# Patient Record
Sex: Female | Born: 1960 | Race: White | Hispanic: No | Smoking: Current every day smoker
Health system: Southern US, Community
[De-identification: ages and names within clinical notes are randomized; demographics above are authoritative.]

## PROBLEM LIST (undated history)

## (undated) DIAGNOSIS — G894 Chronic pain syndrome: Secondary | ICD-10-CM

## (undated) DIAGNOSIS — M542 Cervicalgia: Secondary | ICD-10-CM

## (undated) DIAGNOSIS — F329 Major depressive disorder, single episode, unspecified: Secondary | ICD-10-CM

## (undated) DIAGNOSIS — K219 Gastro-esophageal reflux disease without esophagitis: Secondary | ICD-10-CM

## (undated) DIAGNOSIS — G43909 Migraine, unspecified, not intractable, without status migrainosus: Secondary | ICD-10-CM

## (undated) DIAGNOSIS — M5412 Radiculopathy, cervical region: Secondary | ICD-10-CM

## (undated) DIAGNOSIS — G4700 Insomnia, unspecified: Secondary | ICD-10-CM

## (undated) DIAGNOSIS — M797 Fibromyalgia: Secondary | ICD-10-CM

## (undated) DIAGNOSIS — Z9889 Other specified postprocedural states: Secondary | ICD-10-CM

## (undated) DIAGNOSIS — M4712 Other spondylosis with myelopathy, cervical region: Secondary | ICD-10-CM

## (undated) DIAGNOSIS — M25552 Pain in left hip: Secondary | ICD-10-CM

## (undated) DIAGNOSIS — F410 Panic disorder [episodic paroxysmal anxiety] without agoraphobia: Secondary | ICD-10-CM

## (undated) DIAGNOSIS — K5909 Other constipation: Secondary | ICD-10-CM

## (undated) DIAGNOSIS — M25551 Pain in right hip: Secondary | ICD-10-CM

## (undated) DIAGNOSIS — F32A Depression, unspecified: Secondary | ICD-10-CM

## (undated) HISTORY — DX: Insomnia, unspecified: G47.00

## (undated) HISTORY — DX: Depression, unspecified: F32.A

## (undated) HISTORY — DX: Cervicalgia: M54.2

## (undated) HISTORY — PX: TUBAL LIGATION: SHX77

## (undated) HISTORY — DX: Other specified postprocedural states: Z98.890

## (undated) HISTORY — DX: Pain in right hip: M25.551

## (undated) HISTORY — DX: Fibromyalgia: M79.7

## (undated) HISTORY — PX: NECK SURGERY: SHX720

## (undated) HISTORY — DX: Other spondylosis with myelopathy, cervical region: M47.12

## (undated) HISTORY — DX: Gastro-esophageal reflux disease without esophagitis: K21.9

## (undated) HISTORY — DX: Chronic pain syndrome: G89.4

## (undated) HISTORY — DX: Radiculopathy, cervical region: M54.12

## (undated) HISTORY — DX: Other constipation: K59.09

## (undated) HISTORY — DX: Migraine, unspecified, not intractable, without status migrainosus: G43.909

## (undated) HISTORY — DX: Panic disorder (episodic paroxysmal anxiety): F41.0

## (undated) HISTORY — DX: Major depressive disorder, single episode, unspecified: F32.9

## (undated) HISTORY — PX: OTHER SURGICAL HISTORY: SHX169

## (undated) HISTORY — DX: Pain in left hip: M25.552

---

## 2008-07-28 ENCOUNTER — Encounter: Admission: RE | Admit: 2008-07-28 | Discharge: 2008-07-28 | Payer: Self-pay | Admitting: Obstetrics and Gynecology

## 2010-08-31 ENCOUNTER — Encounter: Payer: Self-pay | Admitting: *Deleted

## 2011-01-22 ENCOUNTER — Encounter (HOSPITAL_COMMUNITY): Payer: Self-pay | Admitting: Obstetrics and Gynecology

## 2014-05-17 ENCOUNTER — Ambulatory Visit: Payer: Self-pay | Admitting: Pain Medicine

## 2014-09-29 ENCOUNTER — Ambulatory Visit: Payer: Self-pay | Admitting: Pain Medicine

## 2014-09-29 LAB — BASIC METABOLIC PANEL
ANION GAP: 6 — AB (ref 7–16)
BUN: 14 mg/dL (ref 7–18)
CHLORIDE: 104 mmol/L (ref 98–107)
CREATININE: 0.95 mg/dL (ref 0.60–1.30)
Calcium, Total: 9.5 mg/dL (ref 8.5–10.1)
Co2: 27 mmol/L (ref 21–32)
EGFR (African American): >60
GLUCOSE: 88 mg/dL (ref 65–99)
Osmolality: 274 (ref 275–301)
POTASSIUM: 4.3 mmol/L (ref 3.5–5.1)
SODIUM: 137 mmol/L (ref 136–145)

## 2014-09-29 LAB — HEPATIC FUNCTION PANEL A (ARMC)
ALBUMIN: 4.6 g/dL (ref 3.4–5.0)
ALK PHOS: 67 U/L
ALT: 21 U/L
BILIRUBIN DIRECT: 0.2 mg/dL (ref 0.00–0.20)
Bilirubin,Total: 0.7 mg/dL (ref 0.2–1.0)
SGOT(AST): 13 U/L — ABNORMAL LOW (ref 15–37)
TOTAL PROTEIN: 8.1 g/dL (ref 6.4–8.2)

## 2014-09-29 LAB — SEDIMENTATION RATE: Erythrocyte Sed Rate: 4 mm/hr (ref 0–30)

## 2014-09-29 LAB — MAGNESIUM: MAGNESIUM: 1.9 mg/dL

## 2014-10-11 ENCOUNTER — Ambulatory Visit: Payer: Self-pay | Admitting: Pain Medicine

## 2014-11-15 ENCOUNTER — Ambulatory Visit: Payer: Self-pay | Admitting: Pain Medicine

## 2015-09-20 ENCOUNTER — Encounter: Payer: Self-pay | Admitting: *Deleted

## 2015-10-10 ENCOUNTER — Encounter: Payer: Self-pay | Admitting: Pain Medicine

## 2015-10-10 ENCOUNTER — Telehealth: Payer: Self-pay | Admitting: Pain Medicine

## 2015-10-10 NOTE — Telephone Encounter (Signed)
Patient left vmail saying she has stomach virus and cannot come today  vmail Sun 10-09-15 at 1:06

## 2015-10-11 ENCOUNTER — Encounter: Payer: Self-pay | Admitting: Pain Medicine

## 2015-10-11 NOTE — Telephone Encounter (Signed)
Please reschedule any patients calling us to inform us that they are sick. Please remember that all of our procedures are elective. Preferably, schedule the patient to return in 14-20 days.

## 2015-10-18 ENCOUNTER — Encounter: Payer: Self-pay | Admitting: Pain Medicine

## 2015-10-18 ENCOUNTER — Ambulatory Visit: Payer: Worker's Compensation | Attending: Pain Medicine | Admitting: Pain Medicine

## 2015-10-18 VITALS — BP 129/85 | HR 77 | Temp 98.1°F | Resp 18 | Ht 67.0 in | Wt 135.0 lb

## 2015-10-18 DIAGNOSIS — F1190 Opioid use, unspecified, uncomplicated: Secondary | ICD-10-CM | POA: Insufficient documentation

## 2015-10-18 DIAGNOSIS — Z5181 Encounter for therapeutic drug level monitoring: Secondary | ICD-10-CM

## 2015-10-18 DIAGNOSIS — M79643 Pain in unspecified hand: Secondary | ICD-10-CM | POA: Insufficient documentation

## 2015-10-18 DIAGNOSIS — G894 Chronic pain syndrome: Secondary | ICD-10-CM | POA: Insufficient documentation

## 2015-10-18 DIAGNOSIS — M47892 Other spondylosis, cervical region: Secondary | ICD-10-CM | POA: Insufficient documentation

## 2015-10-18 DIAGNOSIS — M542 Cervicalgia: Secondary | ICD-10-CM | POA: Insufficient documentation

## 2015-10-18 DIAGNOSIS — K219 Gastro-esophageal reflux disease without esophagitis: Secondary | ICD-10-CM | POA: Insufficient documentation

## 2015-10-18 DIAGNOSIS — F17200 Nicotine dependence, unspecified, uncomplicated: Secondary | ICD-10-CM | POA: Insufficient documentation

## 2015-10-18 DIAGNOSIS — M47812 Spondylosis without myelopathy or radiculopathy, cervical region: Secondary | ICD-10-CM | POA: Insufficient documentation

## 2015-10-18 DIAGNOSIS — M25559 Pain in unspecified hip: Secondary | ICD-10-CM | POA: Insufficient documentation

## 2015-10-18 DIAGNOSIS — Z79891 Long term (current) use of opiate analgesic: Secondary | ICD-10-CM

## 2015-10-18 DIAGNOSIS — M25561 Pain in right knee: Secondary | ICD-10-CM

## 2015-10-18 DIAGNOSIS — M5412 Radiculopathy, cervical region: Secondary | ICD-10-CM | POA: Insufficient documentation

## 2015-10-18 DIAGNOSIS — M797 Fibromyalgia: Secondary | ICD-10-CM | POA: Insufficient documentation

## 2015-10-18 DIAGNOSIS — Z981 Arthrodesis status: Secondary | ICD-10-CM | POA: Diagnosis not present

## 2015-10-18 DIAGNOSIS — G8929 Other chronic pain: Secondary | ICD-10-CM | POA: Insufficient documentation

## 2015-10-18 DIAGNOSIS — M25562 Pain in left knee: Secondary | ICD-10-CM

## 2015-10-18 DIAGNOSIS — F411 Generalized anxiety disorder: Secondary | ICD-10-CM | POA: Insufficient documentation

## 2015-10-18 DIAGNOSIS — M546 Pain in thoracic spine: Secondary | ICD-10-CM | POA: Insufficient documentation

## 2015-10-18 DIAGNOSIS — F1320 Sedative, hypnotic or anxiolytic dependence, uncomplicated: Secondary | ICD-10-CM | POA: Diagnosis not present

## 2015-10-18 DIAGNOSIS — F1120 Opioid dependence, uncomplicated: Secondary | ICD-10-CM

## 2015-10-18 DIAGNOSIS — Z8659 Personal history of other mental and behavioral disorders: Secondary | ICD-10-CM

## 2015-10-18 DIAGNOSIS — M549 Dorsalgia, unspecified: Secondary | ICD-10-CM

## 2015-10-18 DIAGNOSIS — Z9889 Other specified postprocedural states: Secondary | ICD-10-CM

## 2015-10-18 DIAGNOSIS — F410 Panic disorder [episodic paroxysmal anxiety] without agoraphobia: Secondary | ICD-10-CM | POA: Diagnosis not present

## 2015-10-18 DIAGNOSIS — G43909 Migraine, unspecified, not intractable, without status migrainosus: Secondary | ICD-10-CM | POA: Diagnosis not present

## 2015-10-18 DIAGNOSIS — M25551 Pain in right hip: Secondary | ICD-10-CM

## 2015-10-18 DIAGNOSIS — M545 Low back pain: Secondary | ICD-10-CM

## 2015-10-18 DIAGNOSIS — M2550 Pain in unspecified joint: Secondary | ICD-10-CM | POA: Insufficient documentation

## 2015-10-18 DIAGNOSIS — M25552 Pain in left hip: Secondary | ICD-10-CM

## 2015-10-18 HISTORY — DX: Other specified postprocedural states: Z98.890

## 2015-10-18 HISTORY — DX: Pain in right hip: M25.551

## 2015-10-18 HISTORY — DX: Radiculopathy, cervical region: M54.12

## 2015-10-18 HISTORY — DX: Cervicalgia: M54.2

## 2015-10-18 HISTORY — DX: Pain in left hip: M25.552

## 2015-10-18 MED ORDER — HYDROCODONE-ACETAMINOPHEN 7.5-325 MG PO TABS: 1.0000 | ORAL_TABLET | Freq: Four times a day (QID) | ORAL | Status: DC | PRN

## 2015-10-18 NOTE — Progress Notes (Signed)
Patient's Name: Teresa [name] MRN: [id number] DOB: 09/11/1961 DOS: 10/18/2015  Primary Reason(s) for Visit: Encounter for Medication Management. CC: Neck Pain and Hip Pain   HPI:   Teresa [name] is a 54 y.o. year old, female patient, who returns today as an established patient. She has History of panic attacks; Encounter for therapeutic drug level monitoring; Long term current use of opiate analgesic; Chronic cervical radiculopathy; Cervical spine pain; Benzodiazepine dependence (HCC); Uncomplicated opioid dependence (HCC); Opiate use; Chronic pain syndrome; Chronic upper back pain; Thoracic back pain; Hand pain; Upper extremity pain; Pain in joint, shoulder region; Bilateral hip pain; H/O cervical spine surgery; Bilateral chronic knee pain; Chronic low back pain;  cervical spondylosis; Generalized anxiety disorder; Cervical facet syndrome; Myofascial pain syndrome; Diffuse arthralgia; Tobacco use disorder; Fibromyalgia;  History of anterior cervical discectomy and fusion (ACDF) (left side).; and Cervical radicular pain, left side on her problem list.. Her primarily concern today is the Neck Pain and Hip Pain    The patient comes into the clinic today clearly oversedated. In reviewing  Her medications, it turns out that she has been taking hydrocodone 10 mg 4 times a day in addition to alprazolam 2 mg twice a day. Because this is not compatible with the CDC opioid guidelines I have given the patient a choice between continuing to take the hydrocodone or alprazolam. She has asked me for several alternatives and we went over all of those. I explained to the patient that I'll have experience in psychiatry and therefore I cannot recommend an alternative treatment for her panic attacks. We came to the conclusion that she needed to be seen by a psychiatrist and therefore I have made arrangements to get a consult. I do not prescribe benzodiazepines, the patient has been getting them from Teresa [name]. When the  patient was confronted between stopping the pain medicine and the benzodiazepine she indicated that with some help she could probably stopped the benzodiazepines. If we could achieve this, this would be the best alternative. In addition I went over the patient's symptoms and it would appear that her neck pain and bilateral upper extremity pain sit to be the worst. I have offered her some alternatives which includes cervical epidural steroid injections, facet blocks, and depending on the results of those we may consider radiofrequency. I asked her if she had a recent MRI and I couldn't find a reason copy but we did find copies of some x-rays that we did of her cervical spine demonstrating disc prostheses at C4-5, C5-6, and C6-7 levels. The patient has x-ray evidence of a prior C4-C7 anterior fusion. In reviewing the x-rays, they did mention that the patient had a cervical MRI done on 08/31/2010. Unfortunately we do not have the results of that one. In view of this, today we have ordered a repeat MRI since the patient indicates that her symptoms have been worsening. She indicates intermittent weakness of the upper extremities for she drops things. The patient also indicated having had some nerve conduction test done , which again are unavailable. The patient indicated that [facility].  Pharmacotherapy Review: Side-effects or Adverse reactions: None reported. Effectiveness: Described as relatively effective, allowing for increase in activities of daily living (ADL). Onset of action: Within expected pharmacological parameters. Duration of action: Within normal limits for medication. Peak effect: Timing and results are as within normal expected parameters. [place] PMP: Compliant with practice rules and regulations. DST: Compliant with practice rules and regulations.  Lab work: No new labs ordered by our practice. Treatment compliance: Compliant. Substance Use Disorder (SUD) Risk Level:  Low Planned course of action: Continue therapy as is.  Allergies: Teresa [name] is allergic to codeine.  Meds: The patient has a current medication list which includes the following prescription(s): alprazolam, diclofenac, docusate sodium, fluoxetine, hydrocodone-acetaminophen, magnesium oxide, omeprazole, and tizanidine. Requested Prescriptions   Signed Prescriptions Disp Refills  . HYDROcodone-acetaminophen (NORCO) 7.5-325 MG tablet 120 tablet 0    Sig: Take 1 tablet by mouth every 6 (six) hours as needed for moderate pain.    ROS: Constitutional: Afebrile, no chills, well hydrated and well nourished Gastrointestinal: negative Musculoskeletal:negative Neurological: negative Behavioral/Psych: negative  PFSH: Medical:  Teresa [name]  has a past medical history of Fibromyalgia; Cervical spondylosis with myelopathy; Neck pain; Chronic pain syndrome; Depression; Panic attack; Insomnia; GERD (gastroesophageal reflux disease); Chronic constipation; Migraine; and  History of anterior cervical discectomy and fusion (ACDF) (left side). (10/18/2015). Family: family history is not on file. Surgical:  has past surgical history that includes Neck surgery and btl. Tobacco:  reports that she has quit smoking. She does not have any smokeless tobacco history on file. Alcohol:  reports that she does not drink alcohol. Drug:  reports that she does not use illicit drugs.  Physical Exam: Vitals:  Today's Vitals   10/18/15 1332 10/18/15 1334  BP: 129/85   [name]: 77   Temp: 98.1 F (36.7 C)   TempSrc: Oral   Resp: 18   Height:  (1.702 m)   Weight: 135 lb (61.236 kg)   SpO2: 100%   PainSc:  8   Calculated BMI: Body mass index is 21.14 kg/(m^2). General appearance: alert, cooperative, appears stated age, distracted, mild distress and slowed mentation Eyes: conjunctivae/corneas clear. PERRL, EOM's intact. Fundi benign. Lungs: No evidence respiratory distress, no audible rales or ronchi and no use  of accessory muscles of respiration Neck: no adenopathy, no carotid bruit, no JVD, supple, symmetrical, trachea midline and thyroid not enlarged, symmetric, no tenderness/mass/nodules Back: symmetric, no curvature. ROM normal. No CVA tenderness. Extremities: extremities normal, atraumatic, no cyanosis or edema Pulses: 2+ and symmetric Skin: Skin color, texture, turgor normal. No rashes or lesions Neurologic: Grossly normal    Assessment: Encounter Diagnosis:  Primary Diagnosis: Chronic pain syndrome [G89.4]  Plan: Lova was seen today for neck pain and hip pain.  Diagnoses and all orders for this visit:  Chronic pain syndrome  Opiate use  Uncomplicated opioid dependence (HCC)  Benzodiazepine dependence (HCC) -     Ambulatory referral to Psychiatry  Cervical spine pain  Chronic cervical radiculopathy Comments:  bilateral Orders: -     MR Cervical Spine W Wo Contrast; Future  Long term current use of opiate analgesic -     Drugs of abuse screen w/o alc, rtn urine-sln; Standing  Encounter for therapeutic drug level monitoring  History of panic attacks -     Ambulatory referral to Psychiatry   History of anterior cervical discectomy and fusion (ACDF) (left side).  Cervical radicular pain, left side  Other orders -     HYDROcodone-acetaminophen (NORCO) 7.5-325 MG tablet; Take 1 tablet by mouth every 6 (six) hours as needed for moderate pain.     There are no Patient Instructions on file for this visit. Medications discontinued today:  Medications Discontinued During This Encounter  Medication Reason  . HYDROcodone-acetaminophen (NORCO) 7.5-325 MG tablet Reorder   Medications administered today:  Ms. Villa had no medications administered during this visit.  Primary Care Physician: No primary care provider on file. Location: [facility] Note by: Galia Rahm A. Laban Emperor, M.D, DABA, DABAPM, DABPM, DABIPP, FIPP

## 2015-10-18 NOTE — Progress Notes (Signed)
Safety precautions to be maintained throughout the outpatient stay will include: orient to surroundings, keep bed in low position, maintain call bell within reach at all times, provide assistance with transfer out of bed and ambulation.   Did not bring medicines today- asked to bring next appt

## 2015-11-15 ENCOUNTER — Telehealth: Payer: Self-pay

## 2015-11-15 NOTE — Telephone Encounter (Signed)
Pt wants someone to call her about her records and orders of x-rays... Pt would not go into detail.

## 2015-11-16 ENCOUNTER — Encounter: Payer: Self-pay | Admitting: Pain Medicine

## 2015-11-16 NOTE — Telephone Encounter (Signed)
Spoke with patient and told her that we do have her records, she will keep her appt in December.

## 2015-11-17 ENCOUNTER — Other Ambulatory Visit: Payer: Self-pay | Admitting: Pain Medicine

## 2015-12-01 ENCOUNTER — Ambulatory Visit: Payer: Worker's Compensation | Attending: Pain Medicine | Admitting: Pain Medicine

## 2015-12-01 ENCOUNTER — Other Ambulatory Visit: Payer: Self-pay | Admitting: Pain Medicine

## 2015-12-01 ENCOUNTER — Encounter: Payer: Self-pay | Admitting: Pain Medicine

## 2015-12-01 VITALS — BP 127/75 | HR 84 | Temp 98.4°F | Resp 16 | Ht 67.0 in | Wt 130.0 lb

## 2015-12-01 DIAGNOSIS — M5412 Radiculopathy, cervical region: Secondary | ICD-10-CM

## 2015-12-01 DIAGNOSIS — M792 Neuralgia and neuritis, unspecified: Secondary | ICD-10-CM

## 2015-12-01 DIAGNOSIS — Z5181 Encounter for therapeutic drug level monitoring: Secondary | ICD-10-CM | POA: Diagnosis not present

## 2015-12-01 DIAGNOSIS — F17200 Nicotine dependence, unspecified, uncomplicated: Secondary | ICD-10-CM | POA: Insufficient documentation

## 2015-12-01 DIAGNOSIS — M791 Myalgia: Secondary | ICD-10-CM

## 2015-12-01 DIAGNOSIS — Z9889 Other specified postprocedural states: Secondary | ICD-10-CM | POA: Diagnosis not present

## 2015-12-01 DIAGNOSIS — M7918 Myalgia, other site: Secondary | ICD-10-CM | POA: Insufficient documentation

## 2015-12-01 DIAGNOSIS — M797 Fibromyalgia: Secondary | ICD-10-CM

## 2015-12-01 DIAGNOSIS — M25562 Pain in left knee: Secondary | ICD-10-CM | POA: Diagnosis not present

## 2015-12-01 DIAGNOSIS — Z79891 Long term (current) use of opiate analgesic: Secondary | ICD-10-CM

## 2015-12-01 DIAGNOSIS — M62838 Other muscle spasm: Secondary | ICD-10-CM

## 2015-12-01 DIAGNOSIS — M961 Postlaminectomy syndrome, not elsewhere classified: Secondary | ICD-10-CM | POA: Diagnosis not present

## 2015-12-01 DIAGNOSIS — M25519 Pain in unspecified shoulder: Secondary | ICD-10-CM | POA: Diagnosis present

## 2015-12-01 DIAGNOSIS — Z7189 Other specified counseling: Secondary | ICD-10-CM

## 2015-12-01 DIAGNOSIS — M542 Cervicalgia: Secondary | ICD-10-CM | POA: Diagnosis present

## 2015-12-01 DIAGNOSIS — Z0000 Encounter for general adult medical examination without abnormal findings: Secondary | ICD-10-CM | POA: Insufficient documentation

## 2015-12-01 DIAGNOSIS — M6248 Contracture of muscle, other site: Secondary | ICD-10-CM

## 2015-12-01 DIAGNOSIS — G8929 Other chronic pain: Secondary | ICD-10-CM

## 2015-12-01 DIAGNOSIS — F1190 Opioid use, unspecified, uncomplicated: Secondary | ICD-10-CM | POA: Diagnosis not present

## 2015-12-01 DIAGNOSIS — Z79899 Other long term (current) drug therapy: Secondary | ICD-10-CM | POA: Diagnosis not present

## 2015-12-01 DIAGNOSIS — Y990 Civilian activity done for income or pay: Secondary | ICD-10-CM

## 2015-12-01 DIAGNOSIS — R52 Pain, unspecified: Secondary | ICD-10-CM

## 2015-12-01 DIAGNOSIS — Z5189 Encounter for other specified aftercare: Secondary | ICD-10-CM | POA: Diagnosis not present

## 2015-12-01 DIAGNOSIS — F1120 Opioid dependence, uncomplicated: Secondary | ICD-10-CM

## 2015-12-01 DIAGNOSIS — F410 Panic disorder [episodic paroxysmal anxiety] without agoraphobia: Secondary | ICD-10-CM | POA: Diagnosis not present

## 2015-12-01 DIAGNOSIS — M25561 Pain in right knee: Secondary | ICD-10-CM | POA: Insufficient documentation

## 2015-12-01 MED ORDER — GABAPENTIN 100 MG PO CAPS: 300.0000 mg | ORAL_CAPSULE | Freq: Four times a day (QID) | ORAL | Status: DC

## 2015-12-01 MED ORDER — OXYCODONE HCL 10 MG PO TABS: 10.0000 mg | ORAL_TABLET | Freq: Four times a day (QID) | ORAL | Status: DC | PRN

## 2015-12-01 MED ORDER — BACLOFEN 10 MG PO TABS
10.0000 mg | ORAL_TABLET | Freq: Three times a day (TID) | ORAL | Status: DC
Start: 2015-12-01 — End: 2016-02-01

## 2015-12-01 MED ORDER — MAGNESIUM OXIDE 400 MG PO TABS: 400.0000 mg | ORAL_TABLET | Freq: Every day | ORAL | Status: DC

## 2015-12-01 MED ORDER — MELOXICAM 15 MG PO TABS: 15.0000 mg | ORAL_TABLET | Freq: Every day | ORAL | Status: DC

## 2015-12-01 NOTE — Patient Instructions (Addendum)
Pain Management Discharge Instructions  General Discharge Instructions :  If you need to reach your doctor call: Monday-Friday 8:00 am - 4:00 pm at [phone] or toll free [phone].  After clinic hours [phone] to have operator reach doctor.  Bring all of your medication bottles to all your appointments in the pain clinic.  To cancel or reschedule your appointment with Pain Management please remember to call 24 hours in advance to avoid a fee.  Refer to the educational materials which you have been given on: General Risks, I had my Procedure. Discharge Instructions, Post Sedation.  Post Procedure Instructions:  The drugs you were given will stay in your system until tomorrow, so for the next 24 hours you should not drive, make any legal decisions or drink any alcoholic beverages.  You may eat anything you prefer, but it is better to start with liquids then soups and crackers, and gradually work up to solid foods.  Please notify your doctor immediately if you have any unusual bleeding, trouble breathing or pain that is not related to your normal pain.  Depending on the type of procedure that was done, some parts of your body may feel week and/or numb.  This usually clears up by tonight or the next day.  Walk with the use of an assistive device or accompanied by an adult for the 24 hours.  You may use ice on the affected area for the first 24 hours.  Put ice in a Ziploc bag and cover with a towel and place against area 15 minutes on 15 minutes off.  You may switch to heat after 24 hours.Gabapentin capsules or tablets What is this medicine? GABAPENTIN (GA ba pen tin) is used to control partial seizures in adults with epilepsy. It is also used to treat certain types of nerve pain. This medicine may be used for other purposes; ask your health care provider or pharmacist if you have questions. What should I tell my health care provider before I take this medicine? They need to know  if you have any of these conditions: -kidney disease -suicidal thoughts, plans, or attempt; a previous suicide attempt by you or a family member -an unusual or allergic reaction to gabapentin, other medicines, foods, dyes, or preservatives -pregnant or trying to get pregnant -breast-feeding How should I use this medicine? Take this medicine by mouth with a glass of water. Follow the directions on the prescription label. You can take it with or without food. If it upsets your stomach, take it with food.Take your medicine at regular intervals. Do not take it more often than directed. Do not stop taking except on your doctor's advice. If you are directed to break the 600 or 800 mg tablets in half as part of your dose, the extra half tablet should be used for the next dose. If you have not used the extra half tablet within 28 days, it should be thrown away. A special MedGuide will be given to you by the pharmacist with each prescription and refill. Be sure to read this information carefully each time. Talk to your pediatrician regarding the use of this medicine in children. Special care may be needed. Overdosage: If you think you have taken too much of this medicine contact a poison control center or emergency room at once. NOTE: This medicine is only for you. Do not share this medicine with others. What if I miss a dose? If you miss a dose, take it as soon as you can. If it  is almost time for your next dose, take only that dose. Do not take double or extra doses. What may interact with this medicine? Do not take this medicine with any of the following medications: -other gabapentin products This medicine may also interact with the following medications: -alcohol -antacids -antihistamines for allergy, cough and cold -certain medicines for anxiety or sleep -certain medicines for depression or psychotic disturbances -homatropine; hydrocodone -naproxen -narcotic medicines (opiates) for  pain -phenothiazines like chlorpromazine, mesoridazine, prochlorperazine, thioridazine This list may not describe all possible interactions. Give your health care provider a list of all the medicines, herbs, non-prescription drugs, or dietary supplements you use. Also tell them if you smoke, drink alcohol, or use illegal drugs. Some items may interact with your medicine. What should I watch for while using this medicine? Visit your doctor or health care professional for regular checks on your progress. You may want to keep a record at home of how you feel your condition is responding to treatment. You may want to share this information with your doctor or health care professional at each visit. You should contact your doctor or health care professional if your seizures get worse or if you have any new types of seizures. Do not stop taking this medicine or any of your seizure medicines unless instructed by your doctor or health care professional. Stopping your medicine suddenly can increase your seizures or their severity. Wear a medical identification bracelet or chain if you are taking this medicine for seizures, and carry a card that lists all your medications. You may get drowsy, dizzy, or have blurred vision. Do not drive, use machinery, or do anything that needs mental alertness until you know how this medicine affects you. To reduce dizzy or fainting spells, do not sit or stand up quickly, especially if you are an older patient. Alcohol can increase drowsiness and dizziness. Avoid alcoholic drinks. Your mouth may get dry. Chewing sugarless gum or sucking hard candy, and drinking plenty of water will help. The use of this medicine may increase the chance of suicidal thoughts or actions. Pay special attention to how you are responding while on this medicine. Any worsening of mood, or thoughts of suicide or dying should be reported to your health care professional right away. Women who become pregnant  while using this medicine may enroll in the [place] Antiepileptic Drug Pregnancy Registry by calling [phone]. This registry collects information about the safety of antiepileptic drug use during pregnancy. What side effects may I notice from receiving this medicine? Side effects that you should report to your doctor or health care professional as soon as possible: -allergic reactions like skin rash, itching or hives, swelling of the face, lips, or tongue -worsening of mood, thoughts or actions of suicide or dying Side effects that usually do not require medical attention (report to your doctor or health care professional if they continue or are bothersome): -constipation -difficulty walking or controlling muscle movements -dizziness -nausea -slurred speech -tiredness -tremors -weight gain This list may not describe all possible side effects. Call your doctor for medical advice about side effects. You may report side effects to FDA at 1-800-FDA-1088. Where should I keep my medicine? Keep out of reach of children. This medicine may cause accidental overdose and death if it taken by other adults, children, or pets. Mix any unused medicine with a substance like cat litter or coffee grounds. Then throw the medicine away in a sealed container like a sealed bag or a coffee can  with a lid. Do not use the medicine after the expiration date. Store at room temperature between 15 and 30 degrees C (59 and 86 degrees F). NOTE: This sheet is a summary. It may not cover all possible information. If you have questions about this medicine, talk to your doctor, pharmacist, or health care provider.    2016, Elsevier/Gold Standard. (2014-02-12 15:26:50) Epidural Steroid Injection An epidural steroid injection is given to relieve pain in your neck, back, or legs that is caused by the irritation or swelling of a nerve root. This procedure involves injecting a steroid and numbing medicine (anesthetic)  into the epidural space. The epidural space is the space between the outer covering of your spinal cord and the bones that form your backbone (vertebra).  LET [facility] CARE PROVIDER KNOW ABOUT:   Any allergies you have.  All medicines you are taking, including vitamins, herbs, eye drops, creams, and over-the-counter medicines such as aspirin.  Previous problems you or members of your family have had with the use of anesthetics.  Any blood disorders or blood clotting disorders you have.  Previous surgeries you have had.  Medical conditions you have. RISKS AND COMPLICATIONS Generally, this is a safe procedure. However, as with any procedure, complications can occur. Possible complications of epidural steroid injection include:  Headache.  Bleeding.  Infection.  Allergic reaction to the medicines.  Damage to your nerves. The response to this procedure depends on the underlying cause of the pain and its duration. People who have long-term (chronic) pain are less likely to benefit from epidural steroids than are those people whose pain comes on strong and suddenly. BEFORE THE PROCEDURE   Ask your health care provider about changing or stopping your regular medicines. You may be advised to stop taking blood-thinning medicines a few days before the procedure.  You may be given medicines to reduce anxiety.  Arrange for someone to take you home after the procedure. PROCEDURE   You will remain awake during the procedure. You may receive medicine to make you relaxed.  You will be asked to lie on your stomach.  The injection site will be cleaned.  The injection site will be numbed with a medicine (local anesthetic).  A needle will be injected through your skin into the epidural space.  Your health care provider will use an X-ray machine to ensure that the steroid is delivered closest to the affected nerve. You may have minimal discomfort at this time.  Once the needle is in the  right position, the local anesthetic and the steroid will be injected into the epidural space.  The needle will then be removed and a bandage will be applied to the injection site. AFTER THE PROCEDURE   You may be monitored for a short time before you go home.  You may feel weakness or numbness in your arm or leg, which disappears within hours.  You may be allowed to eat, drink, and take your regular medicine.  You may have soreness at the site of the injection.   This information is not intended to replace advice given to you by your health care provider. Make sure you discuss any questions you have with your health care provider.   Document Released: 03/25/2008 Document Revised: 08/19/2013 Document Reviewed: 06/05/2013 Elsevier Interactive Patient Education 2016 Elsevier Inc. GENERAL RISKS AND COMPLICATIONS  What are the risk, side effects and possible complications? Generally speaking, most procedures are safe.  However, with any procedure there are risks, side effects,  and the possibility of complications.  The risks and complications are dependent upon the sites that are lesioned, or the type of nerve block to be performed.  The closer the procedure is to the spine, the more serious the risks are.  Great care is taken when placing the radio frequency needles, block needles or lesioning probes, but sometimes complications can occur.  Infection: Any time there is an injection through the skin, there is a risk of infection.  This is why sterile conditions are used for these blocks.  There are four possible types of infection.  Localized skin infection.  Central Nervous System Infection-This can be in the form of Meningitis, which can be deadly.  Epidural Infections-This can be in the form of an epidural abscess, which can cause pressure inside of the spine, causing compression of the spinal cord with subsequent paralysis. This would require an emergency surgery to decompress, and there  are no guarantees that the patient would recover from the paralysis.  Discitis-This is an infection of the intervertebral discs.  It occurs in about 1% of discography procedures.  It is difficult to treat and it may lead to surgery.        2. Pain: the needles have to go through skin and soft tissues, will cause soreness.       3. Damage to internal structures:  The nerves to be lesioned may be near blood vessels or    other nerves which can be potentially damaged.       4. Bleeding: Bleeding is more common if the patient is taking blood thinners such as  aspirin, Coumadin, Ticiid, Plavix, etc., or if he/she have some genetic predisposition  such as hemophilia. Bleeding into the spinal canal can cause compression of the spinal  cord with subsequent paralysis.  This would require an emergency surgery to  decompress and there are no guarantees that the patient would recover from the  paralysis.       5. Pneumothorax:  Puncturing of a lung is a possibility, every time a needle is introduced in  the area of the chest or upper back.  Pneumothorax refers to free air around the  collapsed lung(s), inside of the thoracic cavity (chest cavity).  Another two possible  complications related to a similar event would include: Hemothorax and Chylothorax.   These are variations of the Pneumothorax, where instead of air around the collapsed  lung(s), you may have blood or chyle, respectively.       6. Spinal headaches: They may occur with any procedures in the area of the spine.       7. Persistent CSF (Cerebro-Spinal Fluid) leakage: This is a rare problem, but may occur  with prolonged intrathecal or epidural catheters either due to the formation of a fistulous  track or a dural tear.       8. Nerve damage: By working so close to the spinal cord, there is always a possibility of  nerve damage, which could be as serious as a permanent spinal cord injury with  paralysis.       9. Death:  Although rare, severe deadly  allergic reactions known as "Anaphylactic  reaction" can occur to any of the medications used.      10. Worsening of the symptoms:  We can always make thing worse.  What are the chances of something like this happening? Chances of any of this occuring are extremely low.  By statistics, you have more of a chance of  getting killed in a motor vehicle accident: while driving to the hospital than any of the above occurring .  Nevertheless, you should be aware that they are possibilities.  In general, it is similar to taking a shower.  Everybody knows that you can slip, hit your head and get killed.  Does that mean that you should not shower again?  Nevertheless always keep in mind that statistics do not mean anything if you happen to be on the wrong side of them.  Even if a procedure has a 1 (one) in a 1,000,000 (million) chance of going wrong, it you happen to be that one..Also, keep in mind that by statistics, you have more of a chance of having something go wrong when taking medications.  Who should not have this procedure? If you are on a blood thinning medication (e.g. Coumadin, Plavix, see list of "Blood Thinners"), or if you have an active infection going on, you should not have the procedure.  If you are taking any blood thinners, please inform your physician.  How should I prepare for this procedure?  Do not eat or drink anything at least six hours prior to the procedure.  Bring a driver with you .  It cannot be a taxi.  Come accompanied by an adult that can drive you back, and that is strong enough to help you if your legs get weak or numb from the local anesthetic.  Take all of your medicines the morning of the procedure with just enough water to swallow them.  If you have diabetes, make sure that you are scheduled to have your procedure done first thing in the morning, whenever possible.  If you have diabetes, take only half of your insulin dose and notify our nurse that you have done so  as soon as you arrive at the clinic.  If you are diabetic, but only take blood sugar pills (oral hypoglycemic), then do not take them on the morning of your procedure.  You may take them after you have had the procedure.  Do not take aspirin or any aspirin-containing medications, at least eleven (11) days prior to the procedure.  They may prolong bleeding.  Wear loose fitting clothing that may be easy to take off and that you would not mind if it got stained with Betadine or blood.  Do not wear any jewelry or perfume  Remove any nail coloring.  It will interfere with some of our monitoring equipment.  NOTE: Remember that this is not meant to be interpreted as a complete list of all possible complications.  Unforeseen problems may occur.  BLOOD THINNERS The following drugs contain aspirin or other products, which can cause increased bleeding during surgery and should not be taken for 2 weeks prior to and 1 week after surgery.  If you should need take something for relief of minor pain, you may take acetaminophen which is found in Tylenol,m Datril, Anacin-3 and Panadol. It is not blood thinner. The products listed below are.  Do not take any of the products listed below in addition to any listed on your instruction sheet.  A.P.C or A.P.C with Codeine Codeine Phosphate Capsules #3 Ibuprofen Ridaura  ABC compound Congesprin Imuran rimadil  Advil Cope Indocin Robaxisal  Alka-Seltzer Effervescent Pain Reliever and Antacid Coricidin or Coricidin-D  Indomethacin Rufen  Alka-Seltzer plus Cold Medicine Cosprin Ketoprofen S-A-C Tablets  Anacin Analgesic Tablets or Capsules Coumadin Korlgesic Salflex  Anacin Extra Strength Analgesic tablets or capsules CP-2 Tablets Lanoril Salicylate  Anaprox Cuprimine Capsules Levenox Salocol  Anexsia-D Dalteparin Magan Salsalate  Anodynos Darvon compound Magnesium Salicylate Sine-off  Ansaid Dasin Capsules Magsal Sodium Salicylate  Anturane Depen Capsules Marnal  Soma  APF Arthritis pain formula Dewitt's Pills Measurin Stanback  Argesic Dia-Gesic Meclofenamic Sulfinpyrazone  Arthritis Bayer Timed Release Aspirin Diclofenac Meclomen Sulindac  Arthritis pain formula Anacin Dicumarol Medipren Supac  Analgesic (Safety coated) Arthralgen Diffunasal Mefanamic Suprofen  Arthritis Strength Bufferin Dihydrocodeine Mepro Compound Suprol  Arthropan liquid Dopirydamole Methcarbomol with Aspirin Synalgos  ASA tablets/Enseals Disalcid Micrainin Tagament  Ascriptin Doan's Midol Talwin  Ascriptin A/D Dolene Mobidin Tanderil  Ascriptin Extra Strength Dolobid Moblgesic Ticlid  Ascriptin with Codeine Doloprin or Doloprin with Codeine Momentum Tolectin  Asperbuf Duoprin Mono-gesic Trendar  Aspergum Duradyne Motrin or Motrin IB Triminicin  Aspirin plain, buffered or enteric coated Durasal Myochrisine Trigesic  Aspirin Suppositories Easprin Nalfon Trillsate  Aspirin with Codeine Ecotrin Regular or Extra Strength Naprosyn Uracel  Atromid-S Efficin Naproxen Ursinus  Auranofin Capsules Elmiron Neocylate Vanquish  Axotal Emagrin Norgesic Verin  Azathioprine Empirin or Empirin with Codeine Normiflo Vitamin E  Azolid Emprazil Nuprin Voltaren  Bayer Aspirin plain, buffered or children's or timed BC Tablets or powders Encaprin Orgaran Warfarin Sodium  Buff-a-Comp Enoxaparin Orudis Zorpin  Buff-a-Comp with Codeine Equegesic Os-Cal-Gesic   Buffaprin Excedrin plain, buffered or Extra Strength Oxalid   Bufferin Arthritis Strength Feldene Oxphenbutazone   Bufferin plain or Extra Strength Feldene Capsules Oxycodone with Aspirin   Bufferin with Codeine Fenoprofen Fenoprofen Pabalate or Pabalate-SF   Buffets II Flogesic Panagesic   Buffinol plain or Extra Strength Florinal or Florinal with Codeine Panwarfarin   Buf-Tabs Flurbiprofen Penicillamine   Butalbital Compound Four-way cold tablets Penicillin   Butazolidin Fragmin Pepto-Bismol   Carbenicillin Geminisyn Percodan   Carna  Arthritis Reliever Geopen Persantine   Carprofen Gold's salt Persistin   Chloramphenicol Goody's Phenylbutazone   Chloromycetin Haltrain Piroxlcam   Clmetidine heparin Plaquenil   Cllnoril Hyco-pap Ponstel   Clofibrate Hydroxy chloroquine Propoxyphen         Before stopping any of these medications, be sure to consult the physician who ordered them.  Some, such as Coumadin (Warfarin) are ordered to prevent or treat serious conditions such as "deep thrombosis", "pumonary embolisms", and other heart problems.  The amount of time that you may need off of the medication may also vary with the medication and the reason for which you were taking it.  If you are taking any of these medications, please make sure you notify your pain physician before you undergo any procedures.

## 2015-12-01 NOTE — Progress Notes (Signed)
Patient's Name: Teresa [name] MRN: [id number] DOB: 02-11-1961 DOS: 12/01/2015  Primary Reason(s) for Visit: Encounter for Medication Management CC: Neck Pain; Shoulder Pain; and Back Pain   HPI:   Ms. Wich is a 54 y.o. year old, female patient, who returns today as an established patient. She has History of panic attacks; Encounter for therapeutic drug level monitoring; Long term current use of opiate analgesic; Chronic cervical radiculopathy (WC); Cervical spine pain (WC); Benzodiazepine dependence ([place]); Uncomplicated opioid dependence ([place]); Opiate use; Chronic pain syndrome; Chronic upper back pain (WC); Thoracic back pain (WC); Hand pain; Upper extremity pain (WC); Pain in joint, shoulder region [facility]); Bilateral hip pain; H/O cervical spine surgery; Bilateral chronic knee pain; Chronic low back pain; Cervical spondylosis (WC); Generalized anxiety disorder; Cervical facet syndrome (WC); Myofascial pain syndrome; Diffuse arthralgia; Tobacco use disorder; Fibromyalgia;  History of anterior cervical discectomy and fusion (ACDF) (left side)  (WC); Cervical radicular pain, left side (WC); Long term prescription opiate use; Encounter for chronic pain management; Pain management; Chronic pain (WC); 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 Her primarily concern today is the Neck Pain; Shoulder Pain; and Back Pain     The patient indicates today that her primary pain is that of the neck, mid to upper back, shoulder, arm, and hand, bilaterally with the left being worse than the right. The patient indicates that the date of injury was 07/20/2010. On April 2014 she underwent an anterior cervical discectomy and fusion by Dr. Patrice Paradise (Neurosurgeon) also did some shoulder injections  for her. She had some left shoulder shots done by Dr. French Ana [facility]). In addition the patient indicates having had some epidural steroid injections and facet blocks before the surgery by Dr. Marlaine Hind. In addition, the patient also volunteered having seen Dr. Ace Gins. She indicates having had a cervical MRI at [facility]. According to the patient she cannot tolerate Lyrica because of cognitive impairment.  Today's Pain Score: 7 , clinically the patient looks like a 2-3/10. Reported level of pain is incompatible with clinical obrservations. This may be secondary to a possible lack of understanding on how the pain scale works. Pain Type: Chronic pain Pain Location:  (neck, shoulder and upper back) Pain Orientation: Left Pain Descriptors / Indicators: Aching, Constant, Burning, Stabbing Pain Frequency: Constant  Date of Last Visit: 10/18/15 Service Provided on Last Visit: Med Refill  Pharmacotherapy Review:   Side-effects or Adverse reactions: None reported. Effectiveness: Described as relatively effective, allowing for increase in activities of daily living (ADL). Onset of action: Within expected pharmacological parameters. Duration of action: Within normal limits for medication. Peak effect: Timing and results are as within normal expected parameters. [place] PMP: Compliant with practice rules and regulations. Medication Assessment Form: Reviewed. Patient indicates being compliant with therapy Treatment compliance: Compliant. Substance Use Disorder (SUD) Risk Level: Low Pharmacologic Plan: Continue therapy as is.  Last Available Lab Work: No visits with results within 3 Month(s) from this visit. Latest known visit with results is:  [facility] Conversion on 09/29/2014  Component Date Value Ref Range Status  . Magnesium 09/29/2014 1.9   Final  . Glucose 09/29/2014 88  65-99 mg/dL Final  . BUN 09/29/2014 14  7-18 mg/dL Final  . Creatinine 09/29/2014 0.95   0.60-1.30 mg/dL Final  . Sodium 09/29/2014 137  136-145 mmol/L Final  . Potassium 09/29/2014 4.3  3.5-5.1 mmol/L Final  . Chloride 09/29/2014 104  98-107 mmol/L Final  . Co2 09/29/2014 27  21-32 mmol/L Final  . Calcium, Total 09/29/2014 9.5  8.5-10.1 mg/dL Final  . Osmolality 09/29/2014 274  275-301 Final  . Anion Gap 09/29/2014 6* 7-16 Final  . EGFR (African American) 09/29/2014 >60  >41m/min Final  . EGFR (Non-African Amer.) 09/29/2014 >60  >6105mmin Final  . SGOT(AST) 09/29/2014 13* 15-37 Unit/L Final  . SGPT (ALT) 09/29/2014 21   Final  . Alkaline Phosphatase 09/29/2014 67   Final  . Albumin 09/29/2014 4.6  3.4-5.0 g/dL Final  . Total Protein 09/29/2014 8.1  6.4-8.2 g/dL Final  . Bilirubin,Total 09/29/2014 0.7  0.2-1.0 mg/dL Final  . Bilirubin, Direct 09/29/2014 0.2  0.00-0.20 mg/dL Final  . Erythrocyte Sed Rate 09/29/2014 4  0-30 mm/hr Final   Allergies: Ms. WiDecourseys allergic to codeine.  Meds: The patient has a current medication list which includes the following prescription(s): alprazolam, azithromycin, diclofenac, docusate sodium, fluoxetine, guaifenesin-codeine, hydrocodone-acetaminophen, magnesium oxide, meloxicam, omeprazole, baclofen, gabapentin, oxycodone hcl, and oxycodone hcl. Requested Prescriptions   Signed Prescriptions Disp Refills  . baclofen (LIORESAL) 10 MG tablet 90 tablet 0    Sig: Take 1 tablet (10 mg total) by mouth 3 (three) times daily.  . Oxycodone HCl 10 MG TABS 120 tablet 0    Sig: Take 1 tablet (10 mg total) by mouth every 6 (six) hours as needed.  . Oxycodone HCl 10 MG TABS 120 tablet 0    Sig: Take 1 tablet (10 mg total) by mouth every 6 (six) hours as needed.  . gabapentin (NEURONTIN) 100 MG capsule 360 capsule 0    Sig: Take 3 capsules (300 mg total) by mouth 4 (four) times daily. Titrate up slowly, as instructed.  . magnesium oxide (MAG-OX) 400 MG tablet 30 tablet 1    Sig: Take 1 tablet (400 mg total) by mouth daily.  . meloxicam (MOBIC) 15  MG tablet 30 tablet 1    Sig: Take 1 tablet (15 mg total) by mouth daily.    ROS: Constitutional: Afebrile, no chills, well hydrated and well nourished Gastrointestinal: negative Musculoskeletal:negative Neurological: negative Behavioral/Psych: negative  PFSH: Medical:  Ms. WiGunkelhas a past medical history of Fibromyalgia; Cervical spondylosis with myelopathy; Neck pain; Chronic pain syndrome; Depression; Panic attack; Insomnia; GERD (gastroesophageal reflux disease); Chronic constipation; Migraine; and  History of anterior cervical discectomy and fusion (ACDF) (left side). (10/18/2015). Family: family history is not on file. Surgical:  has past surgical history that includes Neck surgery and btl. Tobacco:  reports that she has quit smoking. She does not have any smokeless tobacco history on file. Alcohol:  reports that she does not drink alcohol. Drug:  reports that she does not use illicit drugs.  Physical Exam: Vitals:  Today's Vitals   12/01/15 1137 12/01/15 1140  BP:  127/75  Pulse: 84   Temp: 98.4 F (36.9 C)   Resp: 16   Height: 5' 7"  (1.702 m)   Weight: 130 lb (58.968 kg)   SpO2: 100%   PainSc: 7  7   Calculated BMI: Body mass index is 20.36 kg/(m^2). General appearance: alert, cooperative, appears stated age and mild distress Eyes: conjunctivae/corneas clear. PERRL, EOM's intact. Fundi benign. Lungs: No evidence respiratory distress, no audible rales or ronchi and no use of accessory muscles of respiration Neck: no adenopathy, no carotid bruit, no JVD, supple, symmetrical, trachea midline and thyroid  not enlarged, symmetric, no tenderness/mass/nodules Lumbar Spine Palpable Trigger Points: Negative bilaterally Lumbar Hyperextension and rotation: Negative bilaterally Patrick's Maneuver: Negative bilaterally Lower Extremities ROM: Adequate bilaterally Gait: WNL PT & DP Pulses: Palpable bilaterally Skin: Skin color, texture, turgor normal. No rashes or  lesions Neurologic: Grossly normal    Assessment: Encounter Diagnosis:  Primary Diagnosis: Chronic pain [G89.29]  Plan: Marykatherine was seen today for neck pain, shoulder pain and back pain.  Diagnoses and all orders for this visit:  Chronic pain -     Oxycodone HCl 10 MG TABS; Take 1 tablet (10 mg total) by mouth every 6 (six) hours as needed. -     Oxycodone HCl 10 MG TABS; Take 1 tablet (10 mg total) by mouth every 6 (six) hours as needed. -     magnesium oxide (MAG-OX) 400 MG tablet; Take 1 tablet (400 mg total) by mouth daily. -     meloxicam (MOBIC) 15 MG tablet; Take 1 tablet (15 mg total) by mouth daily.  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 Titrate up slowly, as instructed.  Encounter for therapeutic drug level monitoring  Long term current use of opiate analgesic  Opiate use  Uncomplicated opioid dependence (HCC)   History of anterior cervical discectomy and fusion (ACDF) (left side)  (WC)  Cervical radicular pain, left side (WC)  Chronic cervical radiculopathy (WC)  Chronic neck pain (WC)  Musculoskeletal pain -     baclofen (LIORESAL) 10 MG tablet; Take 1 tablet (10 mg total) by mouth 3 (three) times daily.  Muscle spasms of head or neck -     baclofen (LIORESAL) 10 MG tablet; Take 1 tablet (10 mg total) by mouth 3 (three) times daily.  Work related injury (DOI: 07/20/2010)  Neurogenic pain  Neuropathic pain  Fibromyalgia     Patient Instructions   Pain Management Discharge Instructions  General Discharge Instructions :  If you need to reach your doctor call: Monday-Friday 8:00 am - 4:00 pm at [phone] or toll free [phone].  After clinic hours [phone] to have operator reach doctor.  Bring all of your medication  bottles to all your appointments in the pain clinic.  To cancel or reschedule your appointment with Pain Management please remember to call 24 hours in advance to avoid a fee.  Refer to the educational materials which you have been given on: General Risks, I had my Procedure. Discharge Instructions, Post Sedation.  Post Procedure Instructions:  The drugs you were given will stay in your system until tomorrow, so for the next 24 hours you should not drive, make any legal decisions or drink any alcoholic beverages.  You may eat anything you prefer, but it is better to start with liquids then soups and crackers, and gradually work up to solid foods.  Please notify your doctor immediately if you have any unusual bleeding, trouble breathing or pain that is not related to your normal pain.  Depending on the type of procedure that was done, some parts of your body may feel week and/or numb.  This usually clears up by tonight or the next day.  Walk with the use of an assistive device or accompanied by an adult for the 24 hours.  You may use ice on the affected area for the  first 24 hours.  Put ice in a Ziploc bag and cover with a towel and place against area 15 minutes on 15 minutes off.  You may switch to heat after 24 hours.Gabapentin capsules or tablets What is this medicine? GABAPENTIN (GA ba pen tin) is used to control partial seizures in adults with epilepsy. It is also used to treat certain types of nerve pain. This medicine may be used for other purposes; ask your health care provider or pharmacist if you have questions. What should I tell my health care provider before I take this medicine? They need to know if you have any of these conditions: -kidney disease -suicidal thoughts, plans, or attempt; a previous suicide attempt by you or a family member -an unusual or allergic reaction to gabapentin, other medicines, foods, dyes, or preservatives -pregnant or trying to get  pregnant -breast-feeding How should I use this medicine? Take this medicine by mouth with a glass of water. Follow the directions on the prescription label. You can take it with or without food. If it upsets your stomach, take it with food.Take your medicine at regular intervals. Do not take it more often than directed. Do not stop taking except on your doctor's advice. If you are directed to break the 600 or 800 mg tablets in half as part of your dose, the extra half tablet should be used for the next dose. If you have not used the extra half tablet within 28 days, it should be thrown away. A special MedGuide will be given to you by the pharmacist with each prescription and refill. Be sure to read this information carefully each time. Talk to your pediatrician regarding the use of this medicine in children. Special care may be needed. Overdosage: If you think you have taken too much of this medicine contact a poison control center or emergency room at once. NOTE: This medicine is only for you. Do not share this medicine with others. What if I miss a dose? If you miss a dose, take it as soon as you can. If it is almost time for your next dose, take only that dose. Do not take double or extra doses. What may interact with this medicine? Do not take this medicine with any of the following medications: -other gabapentin products This medicine may also interact with the following medications: -alcohol -antacids -antihistamines for allergy, cough and cold -certain medicines for anxiety or sleep -certain medicines for depression or psychotic disturbances -homatropine; hydrocodone -naproxen -narcotic medicines (opiates) for pain -phenothiazines like chlorpromazine, mesoridazine, prochlorperazine, thioridazine This list may not describe all possible interactions. Give your health care provider a list of all the medicines, herbs, non-prescription drugs, or dietary supplements you use. Also tell them  if you smoke, drink alcohol, or use illegal drugs. Some items may interact with your medicine. What should I watch for while using this medicine? Visit your doctor or health care professional for regular checks on your progress. You may want to keep a record at home of how you feel your condition is responding to treatment. You may want to share this information with your doctor or health care professional at each visit. You should contact your doctor or health care professional if your seizures get worse or if you have any new types of seizures. Do not stop taking this medicine or any of your seizure medicines unless instructed by your doctor or health care professional. Stopping your medicine suddenly can increase your seizures or their severity. Wear a medical  identification bracelet or chain if you are taking this medicine for seizures, and carry a card that lists all your medications. You may get drowsy, dizzy, or have blurred vision. Do not drive, use machinery, or do anything that needs mental alertness until you know how this medicine affects you. To reduce dizzy or fainting spells, do not sit or stand up quickly, especially if you are an older patient. Alcohol can increase drowsiness and dizziness. Avoid alcoholic drinks. Your mouth may get dry. Chewing sugarless gum or sucking hard candy, and drinking plenty of water will help. The use of this medicine may increase the chance of suicidal thoughts or actions. Pay special attention to how you are responding while on this medicine. Any worsening of mood, or thoughts of suicide or dying should be reported to your health care professional right away. Women who become pregnant while using this medicine may enroll in the [place] Pregnancy Registry by calling [phone]. This registry collects information about the safety of antiepileptic drug use during pregnancy. What side effects may I notice from receiving this  medicine? Side effects that you should report to your doctor or health care professional as soon as possible: -allergic reactions like skin rash, itching or hives, swelling of the face, lips, or tongue -worsening of mood, thoughts or actions of suicide or dying Side effects that usually do not require medical attention (report to your doctor or health care professional if they continue or are bothersome): -constipation -difficulty walking or controlling muscle movements -dizziness -nausea -slurred speech -tiredness -tremors -weight gain This list may not describe all possible side effects. Call your doctor for medical advice about side effects. You may report side effects to FDA at 1-800-FDA-1088. Where should I keep my medicine? Keep out of reach of children. This medicine may cause accidental overdose and death if it taken by other adults, children, or pets. Mix any unused medicine with a substance like cat litter or coffee grounds. Then throw the medicine away in a sealed container like a sealed bag or a coffee can with a lid. Do not use the medicine after the expiration date. Store at room temperature between 15 and 30 degrees C (59 and 86 degrees F). NOTE: This sheet is a summary. It may not cover all possible information. If you have questions about this medicine, talk to your doctor, pharmacist, or health care provider.    2016, Elsevier/Gold Standard. (2014-02-12 15:26:50) Epidural Steroid Injection An epidural steroid injection is given to relieve pain in your neck, back, or legs that is caused by the irritation or swelling of a nerve root. This procedure involves injecting a steroid and numbing medicine (anesthetic) into the epidural space. The epidural space is the space between the outer covering of your spinal cord and the bones that form your backbone (vertebra).  LET [facility] CARE PROVIDER KNOW ABOUT:   Any allergies you have.  All medicines you are taking, including  vitamins, herbs, eye drops, creams, and over-the-counter medicines such as aspirin.  Previous problems you or members of your family have had with the use of anesthetics.  Any blood disorders or blood clotting disorders you have.  Previous surgeries you have had.  Medical conditions you have. RISKS AND COMPLICATIONS Generally, this is a safe procedure. However, as with any procedure, complications can occur. Possible complications of epidural steroid injection include:  Headache.  Bleeding.  Infection.  Allergic reaction to the medicines.  Damage to your nerves. The response to this  procedure depends on the underlying cause of the pain and its duration. People who have long-term (chronic) pain are less likely to benefit from epidural steroids than are those people whose pain comes on strong and suddenly. BEFORE THE PROCEDURE   Ask your health care provider about changing or stopping your regular medicines. You may be advised to stop taking blood-thinning medicines a few days before the procedure.  You may be given medicines to reduce anxiety.  Arrange for someone to take you home after the procedure. PROCEDURE   You will remain awake during the procedure. You may receive medicine to make you relaxed.  You will be asked to lie on your stomach.  The injection site will be cleaned.  The injection site will be numbed with a medicine (local anesthetic).  A needle will be injected through your skin into the epidural space.  Your health care provider will use an X-ray machine to ensure that the steroid is delivered closest to the affected nerve. You may have minimal discomfort at this time.  Once the needle is in the right position, the local anesthetic and the steroid will be injected into the epidural space.  The needle will then be removed and a bandage will be applied to the injection site. AFTER THE PROCEDURE   You may be monitored for a short time before you go  home.  You may feel weakness or numbness in your arm or leg, which disappears within hours.  You may be allowed to eat, drink, and take your regular medicine.  You may have soreness at the site of the injection.   This information is not intended to replace advice given to you by your health care provider. Make sure you discuss any questions you have with your health care provider.   Document Released: 03/25/2008 Document Revised: 08/19/2013 Document Reviewed: 06/05/2013 Elsevier Interactive Patient Education 2016 [place]  What are the risk, side effects and possible complications? Generally speaking, most procedures are safe.  However, with any procedure there are risks, side effects, and the possibility of complications.  The risks and complications are dependent upon the sites that are lesioned, or the type of nerve block to be performed.  The closer the procedure is to the spine, the more serious the risks are.  Great care is taken when placing the radio frequency needles, block needles or lesioning probes, but sometimes complications can occur.  Infection: Any time there is an injection through the skin, there is a risk of infection.  This is why sterile conditions are used for these blocks.  There are four possible types of infection.  Localized skin infection.  Central Nervous System Infection-This can be in the form of Meningitis, which can be deadly.  Epidural Infections-This can be in the form of an epidural abscess, which can cause pressure inside of the spine, causing compression of the spinal cord with subsequent paralysis. This would require an emergency surgery to decompress, and there are no guarantees that the patient would recover from the paralysis.  Discitis-This is an infection of the intervertebral discs.  It occurs in about 1% of discography procedures.  It is difficult to treat and it may lead to surgery.        2. Pain: the  needles have to go through skin and soft tissues, will cause soreness.       3. Damage to internal structures:  The nerves to be lesioned may be near blood vessels or  other nerves which can be potentially damaged.       4. Bleeding: Bleeding is more common if the patient is taking blood thinners such as  aspirin, Coumadin, Ticiid, Plavix, etc., or if he/she have some genetic predisposition  such as hemophilia. Bleeding into the spinal canal can cause compression of the spinal  cord with subsequent paralysis.  This would require an emergency surgery to  decompress and there are no guarantees that the patient would recover from the  paralysis.       5. Pneumothorax:  Puncturing of a lung is a possibility, every time a needle is introduced in  the area of the chest or upper back.  Pneumothorax refers to free air around the  collapsed lung(s), inside of the thoracic cavity (chest cavity).  Another two possible  complications related to a similar event would include: Hemothorax and Chylothorax.   These are variations of the Pneumothorax, where instead of air around the collapsed  lung(s), you may have blood or chyle, respectively.       6. Spinal headaches: They may occur with any procedures in the area of the spine.       7. Persistent CSF (Cerebro-Spinal Fluid) leakage: This is a rare problem, but may occur  with prolonged intrathecal or epidural catheters either due to the formation of a fistulous  track or a dural tear.       8. Nerve damage: By working so close to the spinal cord, there is always a possibility of  nerve damage, which could be as serious as a permanent spinal cord injury with  paralysis.       9. Death:  Although rare, severe deadly allergic reactions known as "Anaphylactic  reaction" can occur to any of the medications used.      10. Worsening of the symptoms:  We can always make thing worse.  What are the chances of something like this happening? Chances of any of this occuring are  extremely low.  By statistics, you have more of a chance of getting killed in a motor vehicle accident: while driving to the hospital than any of the above occurring .  Nevertheless, you should be aware that they are possibilities.  In general, it is similar to taking a shower.  Everybody knows that you can slip, hit your head and get killed.  Does that mean that you should not shower again?  Nevertheless always keep in mind that statistics do not mean anything if you happen to be on the wrong side of them.  Even if a procedure has a 1 (one) in a 1,000,000 (million) chance of going wrong, it you happen to be that one..Also, keep in mind that by statistics, you have more of a chance of having something go wrong when taking medications.  Who should not have this procedure? If you are on a blood thinning medication (e.g. Coumadin, Plavix, see list of "Blood Thinners"), or if you have an active infection going on, you should not have the procedure.  If you are taking any blood thinners, please inform your physician.  How should I prepare for this procedure?  Do not eat or drink anything at least six hours prior to the procedure.  Bring a driver with you .  It cannot be a taxi.  Come accompanied by an adult that can drive you back, and that is strong enough to help you if your legs get weak or numb from the local anesthetic.  Take all  of your medicines the morning of the procedure with just enough water to swallow them.  If you have diabetes, make sure that you are scheduled to have your procedure done first thing in the morning, whenever possible.  If you have diabetes, take only half of your insulin dose and notify our nurse that you have done so as soon as you arrive at the clinic.  If you are diabetic, but only take blood sugar pills (oral hypoglycemic), then do not take them on the morning of your procedure.  You may take them after you have had the procedure.  Do not take aspirin or any  aspirin-containing medications, at least eleven (11) days prior to the procedure.  They may prolong bleeding.  Wear loose fitting clothing that may be easy to take off and that you would not mind if it got stained with Betadine or blood.  Do not wear any jewelry or perfume  Remove any nail coloring.  It will interfere with some of our monitoring equipment.  NOTE: Remember that this is not meant to be interpreted as a complete list of all possible complications.  Unforeseen problems may occur.  BLOOD THINNERS The following drugs contain aspirin or other products, which can cause increased bleeding during surgery and should not be taken for 2 weeks prior to and 1 week after surgery.  If you should need take something for relief of minor pain, you may take acetaminophen which is found in Tylenol,m Datril, Anacin-3 and Panadol. It is not blood thinner. The products listed below are.  Do not take any of the products listed below in addition to any listed on your instruction sheet.  A.P.C or A.P.C with Codeine Codeine Phosphate Capsules #3 Ibuprofen Ridaura  ABC compound Congesprin Imuran rimadil  Advil Cope Indocin Robaxisal  Alka-Seltzer Effervescent Pain Reliever and Antacid Coricidin or Coricidin-D  Indomethacin Rufen  Alka-Seltzer plus Cold Medicine Cosprin Ketoprofen S-A-C Tablets  Anacin Analgesic Tablets or Capsules Coumadin Korlgesic Salflex  Anacin Extra Strength Analgesic tablets or capsules CP-2 Tablets Lanoril Salicylate  Anaprox Cuprimine Capsules Levenox Salocol  Anexsia-D Dalteparin Magan Salsalate  Anodynos Darvon compound Magnesium Salicylate Sine-off  Ansaid Dasin Capsules Magsal Sodium Salicylate  Anturane Depen Capsules Marnal Soma  APF Arthritis pain formula Dewitt's Pills Measurin Stanback  Argesic Dia-Gesic Meclofenamic Sulfinpyrazone  Arthritis Bayer Timed Release Aspirin Diclofenac Meclomen Sulindac  Arthritis pain formula Anacin Dicumarol Medipren Supac  Analgesic  (Safety coated) Arthralgen Diffunasal Mefanamic Suprofen  Arthritis Strength Bufferin Dihydrocodeine Mepro Compound Suprol  Arthropan liquid Dopirydamole Methcarbomol with Aspirin Synalgos  ASA tablets/Enseals Disalcid Micrainin Tagament  Ascriptin Doan's Midol Talwin  Ascriptin A/D Dolene Mobidin Tanderil  Ascriptin Extra Strength Dolobid Moblgesic Ticlid  Ascriptin with Codeine Doloprin or Doloprin with Codeine Momentum Tolectin  Asperbuf Duoprin Mono-gesic Trendar  Aspergum Duradyne Motrin or Motrin IB Triminicin  Aspirin plain, buffered or enteric coated Durasal Myochrisine Trigesic  Aspirin Suppositories Easprin Nalfon Trillsate  Aspirin with Codeine Ecotrin Regular or Extra Strength Naprosyn Uracel  Atromid-S Efficin Naproxen Ursinus  Auranofin Capsules Elmiron Neocylate Vanquish  Axotal Emagrin Norgesic Verin  Azathioprine Empirin or Empirin with Codeine Normiflo Vitamin E  Azolid Emprazil Nuprin Voltaren  Bayer Aspirin plain, buffered or children's or timed BC Tablets or powders Encaprin Orgaran Warfarin Sodium  Buff-a-Comp Enoxaparin Orudis Zorpin  Buff-a-Comp with Codeine Equegesic Os-Cal-Gesic   Buffaprin Excedrin plain, buffered or Extra Strength Oxalid   Bufferin Arthritis Strength Feldene Oxphenbutazone   Bufferin plain or Extra Strength Feldene Capsules Oxycodone  with Aspirin   Bufferin with Codeine Fenoprofen Fenoprofen Pabalate or Pabalate-SF   Buffets II Flogesic Panagesic   Buffinol plain or Extra Strength Florinal or Florinal with Codeine Panwarfarin   Buf-Tabs Flurbiprofen Penicillamine   Butalbital Compound Four-way cold tablets Penicillin   Butazolidin Fragmin Pepto-Bismol   Carbenicillin Geminisyn Percodan   Carna Arthritis Reliever Geopen Persantine   Carprofen Gold's salt Persistin   Chloramphenicol Goody's Phenylbutazone   Chloromycetin Haltrain Piroxlcam   Clmetidine heparin Plaquenil   Cllnoril Hyco-pap Ponstel   Clofibrate Hydroxy chloroquine  Propoxyphen         Before stopping any of these medications, be sure to consult the physician who ordered them.  Some, such as Coumadin (Warfarin) are ordered to prevent or treat serious conditions such as "deep thrombosis", "pumonary embolisms", and other heart problems.  The amount of time that you may need off of the medication may also vary with the medication and the reason for which you were taking it.  If you are taking any of these medications, please make sure you notify your pain physician before you undergo any procedures.            Medications discontinued today:  Medications Discontinued During This Encounter  Medication Reason  . tiZANidine (ZANAFLEX) 4 MG tablet Duplicate  . magnesium oxide (MAG-OX) 400 MG tablet Reorder  . meloxicam (MOBIC) 15 MG tablet Reorder  . omeprazole (PRILOSEC) 10 MG capsule Error   Medications administered today:  Ms. Mullins had no medications administered during this visit.  Primary Care Physician: No primary care provider on file. Location: [facility] Note by: Tyishia Aune A. Dossie Arbour, M.D, DABA, DABAPM, DABPM, DABIPP, FIPP

## 2015-12-05 DIAGNOSIS — Y990 Civilian activity done for income or pay: Secondary | ICD-10-CM | POA: Insufficient documentation

## 2015-12-05 DIAGNOSIS — M792 Neuralgia and neuritis, unspecified: Secondary | ICD-10-CM | POA: Insufficient documentation

## 2015-12-09 LAB — TOXASSURE SELECT 13 (MW), URINE: PDF: 0

## 2015-12-12 ENCOUNTER — Encounter: Payer: Self-pay | Admitting: Pain Medicine

## 2015-12-12 DIAGNOSIS — R899 Unspecified abnormal finding in specimens from other organs, systems and tissues: Secondary | ICD-10-CM | POA: Insufficient documentation

## 2015-12-12 NOTE — Progress Notes (Signed)
Quick Note:  The findings of this UDT were reported as abnormal due to inconsistencies with expected results. Expectations were based on the medication history provided by the patient at the time of sample collection. Upon return, these results will be shared with the client in an effort to clarify them before making any changes in the clinical management of the medications. ______ 

## 2016-01-05 ENCOUNTER — Other Ambulatory Visit: Payer: Self-pay | Admitting: Pain Medicine

## 2016-02-01 ENCOUNTER — Ambulatory Visit: Payer: Worker's Compensation | Attending: Pain Medicine | Admitting: Pain Medicine

## 2016-02-01 ENCOUNTER — Other Ambulatory Visit: Payer: Self-pay | Admitting: Pain Medicine

## 2016-02-01 ENCOUNTER — Encounter: Payer: Self-pay | Admitting: Pain Medicine

## 2016-02-01 VITALS — BP 142/71 | HR 58 | Temp 97.6°F | Resp 18 | Ht 67.0 in | Wt 130.0 lb

## 2016-02-01 DIAGNOSIS — K219 Gastro-esophageal reflux disease without esophagitis: Secondary | ICD-10-CM | POA: Diagnosis not present

## 2016-02-01 DIAGNOSIS — G43909 Migraine, unspecified, not intractable, without status migrainosus: Secondary | ICD-10-CM | POA: Insufficient documentation

## 2016-02-01 DIAGNOSIS — M797 Fibromyalgia: Secondary | ICD-10-CM | POA: Insufficient documentation

## 2016-02-01 DIAGNOSIS — Z981 Arthrodesis status: Secondary | ICD-10-CM | POA: Insufficient documentation

## 2016-02-01 DIAGNOSIS — G4700 Insomnia, unspecified: Secondary | ICD-10-CM | POA: Insufficient documentation

## 2016-02-01 DIAGNOSIS — M5382 Other specified dorsopathies, cervical region: Secondary | ICD-10-CM

## 2016-02-01 DIAGNOSIS — M542 Cervicalgia: Secondary | ICD-10-CM | POA: Diagnosis not present

## 2016-02-01 DIAGNOSIS — M62838 Other muscle spasm: Secondary | ICD-10-CM | POA: Diagnosis not present

## 2016-02-01 DIAGNOSIS — M25511 Pain in right shoulder: Secondary | ICD-10-CM

## 2016-02-01 DIAGNOSIS — M791 Myalgia: Secondary | ICD-10-CM

## 2016-02-01 DIAGNOSIS — G8929 Other chronic pain: Secondary | ICD-10-CM | POA: Insufficient documentation

## 2016-02-01 DIAGNOSIS — K5909 Other constipation: Secondary | ICD-10-CM | POA: Insufficient documentation

## 2016-02-01 DIAGNOSIS — M47812 Spondylosis without myelopathy or radiculopathy, cervical region: Secondary | ICD-10-CM

## 2016-02-01 DIAGNOSIS — M5412 Radiculopathy, cervical region: Secondary | ICD-10-CM

## 2016-02-01 DIAGNOSIS — Z87891 Personal history of nicotine dependence: Secondary | ICD-10-CM | POA: Insufficient documentation

## 2016-02-01 DIAGNOSIS — M961 Postlaminectomy syndrome, not elsewhere classified: Secondary | ICD-10-CM | POA: Diagnosis not present

## 2016-02-01 DIAGNOSIS — F329 Major depressive disorder, single episode, unspecified: Secondary | ICD-10-CM | POA: Diagnosis not present

## 2016-02-01 DIAGNOSIS — M7918 Myalgia, other site: Secondary | ICD-10-CM

## 2016-02-01 DIAGNOSIS — M25559 Pain in unspecified hip: Secondary | ICD-10-CM | POA: Insufficient documentation

## 2016-02-01 DIAGNOSIS — Z5181 Encounter for therapeutic drug level monitoring: Secondary | ICD-10-CM | POA: Diagnosis not present

## 2016-02-01 DIAGNOSIS — Z9889 Other specified postprocedural states: Secondary | ICD-10-CM | POA: Diagnosis not present

## 2016-02-01 DIAGNOSIS — M6248 Contracture of muscle, other site: Secondary | ICD-10-CM

## 2016-02-01 DIAGNOSIS — F1190 Opioid use, unspecified, uncomplicated: Secondary | ICD-10-CM | POA: Diagnosis not present

## 2016-02-01 DIAGNOSIS — F410 Panic disorder [episodic paroxysmal anxiety] without agoraphobia: Secondary | ICD-10-CM | POA: Insufficient documentation

## 2016-02-01 DIAGNOSIS — R899 Unspecified abnormal finding in specimens from other organs, systems and tissues: Secondary | ICD-10-CM

## 2016-02-01 DIAGNOSIS — M25512 Pain in left shoulder: Secondary | ICD-10-CM | POA: Insufficient documentation

## 2016-02-01 DIAGNOSIS — M79603 Pain in arm, unspecified: Secondary | ICD-10-CM | POA: Diagnosis present

## 2016-02-01 DIAGNOSIS — Z79891 Long term (current) use of opiate analgesic: Secondary | ICD-10-CM

## 2016-02-01 MED ORDER — BACLOFEN 10 MG PO TABS: 10.0000 mg | ORAL_TABLET | Freq: Three times a day (TID) | ORAL | Status: DC

## 2016-02-01 MED ORDER — MELOXICAM 15 MG PO TABS: 15.0000 mg | ORAL_TABLET | Freq: Every day | ORAL | Status: DC

## 2016-02-01 MED ORDER — MAGNESIUM OXIDE 400 MG PO TABS: 400.0000 mg | ORAL_TABLET | Freq: Every day | ORAL | Status: DC

## 2016-02-01 NOTE — Assessment & Plan Note (Signed)
Today 02/01/2016, we will discontinue the use of opioids by request of the patient.

## 2016-02-01 NOTE — Assessment & Plan Note (Signed)
A diagnostic bilateral cervical facet block under fluoroscopic guidance and IV sedation would go along ways in confirming or ruling out facet syndrome as part of her axial pain. If she was to respond well to a diagnostic injection, but not get no long-term benefit, then she may be a good candidate for radiofrequency ablation.

## 2016-02-01 NOTE — Progress Notes (Signed)
Safety precautions to be maintained throughout the outpatient stay will include: orient to surroundings, keep bed in low position, maintain call bell within reach at all times, provide assistance with transfer out of bed and ambulation. Oxycodone pill count # 15/120  Filled on 01-05-16

## 2016-02-01 NOTE — Assessment & Plan Note (Signed)
This is the patient's primary pain. Because it is axial, and bilateral, it is likely that its etiology is facet disease. This can be tested by doing bilateral diagnostic cervical facet blocks under fluoroscopic guidance and IV sedation. Should the patient obtain short-term relief of the pain for the duration of the localized static, but no long-term benefit, she could be a decent candidate for cervical facet radiofrequency ablation.

## 2016-02-01 NOTE — Progress Notes (Signed)
Patient's Name: Teresa [name] MRN: [id number] DOB: [birth year] DOS: 02/01/2016  Primary Reason(s) for Visit: Encounter for Medication Management CC: Neck Pain and Arm Pain   HPI  Teresa [name] is a 55 y.o. year old, female patient, who returns today as an established patient. She has History of panic attacks; Encounter for therapeutic drug level monitoring; Long term current use of opiate analgesic; Benzodiazepine dependence (HCC); Uncomplicated opioid dependence (HCC); Opiate use; Chronic pain syndrome; Chronic upper back pain (WC); Thoracic back pain (WC); Hand pain; H/O cervical spine surgery; Chronic knee pain (Bilateral); Chronic low back pain; Cervical spondylosis (WC); Generalized anxiety disorder; Cervical facet syndrome (WC) (Bilateral) (L>R); Diffuse arthralgia; Tobacco use disorder; Fibromyalgia;  History of anterior cervical discectomy and fusion (ACDF) (left side)  (WC); Cervical radicular pain (WC) (Left); Long term prescription opiate use; Encounter for chronic pain management; Chronic pain (WC); 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 Her primarily concern today is the Neck Pain and Arm Pain   The patient returns to the clinics today for pharmacological management of her chronic pain. Unfortunately, her 12/01/2015 UDS came back abnormal with no oxycodone and positive for codeine and its metabolites. In the case of oxycodone is 7 low to explain since we had not prescribed that to the patient until 12/01/2015. In the case of the hydrocodone, the last time that we have prescribed some for this patient was on 10/19/2015 and since the patient had not been approved  to come in until 12/01/2015, it is easy to understand why she did not have any hydrocodone on the system. If she took medication as prescribed she was supposed to have ran out around 11/18/2015. With regards to the codeine, it is explained by the fact that apparently she had an upper respiratory infection and was given a cough syrup with codeine on 11/28/2015.  Today I went over the results of the UDS with the patient and I explained to her the importance of this test. In addition, I also went over the results of the PMP and I took the opportunity to question why if I had started prescribing opioids for her on 10/11/2014, Y was at that there was a prescription for hydrocodone on 04/28/2015, 05/30/2015, and 07/01/2015 that were written by Dr. Lesli Albee? She explained to me that during that time Worker's Compensation had not approved her to come in and be seen for her pain. I explained to her that when this happens, she need to get counseling from a lowered. She indicated that she had for apparently he didn't say anything about this. I recommended for her to talk to him in more detail about the situation since obtaining medications from another physician could be considered as "Doctor shopping", which is illegal. It is my impression that her mistakes are due to lack of understanding on how the system works. Because of this, I informed her that today we would consider this a warning and that I would not take action on it. I didn't proceeded to explain to her in great detail about the PMP system and the reasons why we follow it.  Unfortunately, this proved to be too much for this patient and she became very anxious and depressed. She indicated  that all this was happening because Worker's Compensation was not approving any of the things that she needed. She went on to indicated that she does not want to continue anymore on any of these pain medications and what she wants to do is get physical therapy in the  form of water therapy or water aerobics, try some acupuncture, and see a psychiatrist because at this time she simply cannot handle the stress and a longer period. I respect her decision and I informed her that I would take care of the referrals for all of these, which I believe are good options for treating her pain.  She indicates not having been able to tolerate the Lyrica and also feeling spaced out with the Neurontin.  Reported Pain Score: 7 , clinically she looks like a 2/10. However, I clearly understand were she would rated as a 7 since she is including "suffering" as part of her pain score. Reported level is inconsistent with clinical obrservations. Pain Type: Chronic pain Pain Location: Neck Pain Descriptors / Indicators: Radiating, Sharp, Stabbing, Burning, Aching Pain Frequency: Constant  Date of Last Visit: 12/01/15 Service Provided on Last Visit: Med Refill  Pharmacotherapy  Medication(s): Oxycodone IR 10 mg every 6 hours when necessary for pain. Onset of action: Within expected pharmacological parameters Time to Peak effect: Timing and results are as within normal expected parameters Analgesic Effect: More than 50% Activity Facilitation: Medication(s) allow patient to sit, stand, walk, and do the basic ADLs Perceived Effectiveness: Described as relatively effective, allowing for increase in activities of daily living (ADL) Side-effects or Adverse reactions: None reported Duration of action: Within normal limits for medication [place] PMP: Abnormal. Results discussed with patient. Opioid prescriptions written by an all physician on 04/28/2015, 05/30/2015, and 07/01/2015. In addition she received codeine with her cough syrup on 11/28/2015. UDS Results: Last UDS done on 12/01/2015 was read as abnormal due to the lack of hydrocodone and the presence of codeine and its metabolites. UDS Interpretation: Patient appears to be compliant with practice rules and regulations Medication  Assessment Form: Reviewed. Patient indicates being compliant with therapy Treatment compliance: Compliant Substance Use Disorder (SUD) Risk Level: Low Pharmacologic Plan: The patient has decided to stop the use of the opioids. The patient did not accept a prescription for opioids from [place] today.  Lab Work: Illicit Drugs No results found for: THCU, COCAINSCRNUR, PCPSCRNUR, MDMA, AMPHETMU, METHADONE, ETOH  Inflammation Markers Lab Results  Component Value Date   ESRSEDRATE 4 09/29/2014    Renal Function Lab Results  Component Value Date   BUN 14 09/29/2014   CREATININE 0.95 09/29/2014   GFRAA >60 09/29/2014   GFRNONAA >60 09/29/2014    Hepatic Function Lab Results  Component Value Date   AST 13* 09/29/2014   ALT 21 09/29/2014   ALBUMIN 4.6 09/29/2014    Electrolytes Lab Results  Component Value Date   NA 137 09/29/2014   K 4.3 09/29/2014   CL 104 09/29/2014   CALCIUM 9.5 09/29/2014   MG 1.9 09/29/2014    Allergies  Teresa [name] is allergic to codeine.  Meds  The patient has a current medication list which includes the following prescription(s): alprazolam, baclofen, docusate sodium, fluoxetine, gabapentin, magnesium oxide, meloxicam, omeprazole, oxycodone hcl, and oxycodone hcl.  Current Outpatient Prescriptions on File Prior to Visit  Medication Sig  . alprazolam (XANAX) 2 MG tablet Take 2 mg by mouth 2 (two) times daily as needed.   . docusate sodium (COLACE) 100 MG capsule Take 100  mg by mouth 2 (two) times daily.  Marland Kitchen FLUoxetine (PROZAC) 20 MG tablet Take 20 mg by mouth daily.  Marland Kitchen gabapentin (NEURONTIN) 100 MG capsule Take 3 capsules (300 mg total) by mouth 4 (four) times daily. Titrate up slowly, as instructed.  Marland Kitchen omeprazole (PRILOSEC) 40 MG capsule Take 40 mg by mouth daily.  . Oxycodone HCl 10 MG TABS Take 1 tablet (10 mg total) by mouth every 6 (six) hours as needed.  . Oxycodone HCl 10 MG TABS Take 1 tablet (10 mg total) by mouth every 6 (six) hours as needed.    No current facility-administered medications on file prior to visit.    ROS  Constitutional: Afebrile, no chills, well hydrated and well nourished Gastrointestinal: negative Musculoskeletal:negative Neurological: negative Behavioral/Psych: negative  PFSH  Medical:  Teresa [name]  has a past medical history of Fibromyalgia; Cervical spondylosis with myelopathy; Neck pain; Chronic pain syndrome; Depression; Panic attack; Insomnia; GERD (gastroesophageal reflux disease); Chronic constipation; Migraine;  History of anterior cervical discectomy and fusion (ACDF) (left side). (10/18/2015); Bilateral hip pain (10/18/2015); Cervical spine pain (WC) (10/18/2015); and Chronic cervical radiculopathy (WC) (10/18/2015). Family: family history is not on file. Surgical:  has past surgical history that includes Neck surgery and btl. Tobacco:  reports that she has quit smoking. She does not have any smokeless tobacco history on file. Alcohol:  reports that she does not drink alcohol. Drug:  reports that she does not use illicit drugs.  Physical Exam  Vitals:  Today's Vitals   02/01/16 1518 02/01/16 1520  BP:  142/71  Pulse: 58   Temp: 97.6 F (36.4 C)   Resp: 18   Height:  (1.702 m)   Weight: 130 lb (58.968 kg)   SpO2: 100%   PainSc: 7  7   PainLoc: Neck     Calculated BMI: Body mass index is 20.36 kg/(m^2).  General appearance: alert, cooperative, appears stated age, mild distress and but very anxious Eyes: PERLA Respiratory: No evidence respiratory distress, no audible rales or ronchi and no use of accessory muscles of respiration  Cervical Spine Inspection: Normal anatomy Alignment: Symetrical ROM: Decreased  Upper Extremities Inspection: No gross anomalies detected ROM: Adequate Sensory: Dysesthesias over left shoulder. Motor: Unremarkable  Thoracic Spine Inspection: No gross anomalies detected Alignment: Symetrical ROM: Adequate Palpation: WNL  Lumbar  Spine Inspection: No gross anomalies detected Alignment: Symetrical ROM: Decreased Gait: WNL  Lower Extremities Inspection: No gross anomalies detected ROM: Adequate Sensory:  Normal Motor: Unremarkable  Assessment & Plan  Primary Diagnosis & Pertinent Problem List: The primary encounter diagnosis was Chronic pain (WC). 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  Visit Diagnosis: 1. Chronic pain (WC)   2. Cervical spine pain (WC)   3. 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 Encounter for therapeutic drug level monitoring   5. Long term current use of opiate analgesic   6. Abnormal UDS (12/01/2015)   7. Chronic pain   8. Failed cervical surgery syndrome (ACDF  by Dr. Noel Gerold)   9. Musculoskeletal pain   10. Muscle spasms of head or neck   11. Chronic hip pain, unspecified laterality   12. Cervical radicular pain (Left) (WC)   13. Chronic shoulder pain (Left)   14. Chronic neck pain (WC) (Location of Primary Source of Pain) (Bilateral) (L>R)   15. Cervical facet syndrome (WC) (Bilateral) (L>R)   16. Opiate use     Assessment: Chronic neck pain (WC) (Location of Primary Source of Pain) (Bilateral) (L>R) This is the patient's primary pain. Because it is axial, and bilateral, it is likely that its etiology is facet disease. This can be tested by doing bilateral diagnostic cervical facet blocks  under fluoroscopic guidance and IV sedation. Should the patient obtain short-term relief of the pain for the duration of the localized static, but no long-term benefit, she could be a decent candidate for cervical facet radiofrequency ablation.  Cervical facet syndrome (WC) (Bilateral) (L>R) A diagnostic bilateral cervical facet block under fluoroscopic guidance and IV sedation would go along ways in confirming or ruling out facet syndrome as part of her axial pain. If she was to respond well to a diagnostic injection, but not get no long-term benefit, then she may be a good candidate for radiofrequency ablation.  Opiate use Today 02/01/2016, we will discontinue the use of opioids by request of the patient.    Plan of Care  Pharmacotherapy (Medications Ordered): Meds ordered this encounter  Medications  . magnesium oxide (MAG-OX) 400 MG tablet    Sig: Take 1 tablet (400 mg total) by mouth daily.    Dispense:  30 tablet    Refill:  2    Do not place this medication, or any other prescription from our practice, on "Automatic Refill". Patient may have prescription filled one day early if pharmacy is closed on scheduled refill date.  . meloxicam (MOBIC) 15 MG tablet    Sig: Take 1 tablet (15 mg total) by mouth daily.    Dispense:  30 tablet    Refill:  2    Do not place this medication, or any other prescription from our practice, on "Automatic Refill". Patient may have prescription filled one day early if pharmacy is closed on scheduled refill date.  . baclofen (LIORESAL) 10 MG tablet    Sig: Take 1 tablet (10 mg total) by mouth 3 (three) times daily.    Dispense:  90 tablet    Refill:  2    Do not place this medication, or any other prescription from our practice, on "Automatic Refill". Patient may have prescription filled one day early if pharmacy is closed on scheduled refill date.    Lab-work & Procedure Ordered: Orders Placed This Encounter  Procedures  . Ambulatory referral to  Physical Therapy    Referral Priority:  Routine    Referral Type:  Physical Medicine    Referral Reason:  Specialty Services Required    Requested Specialty:  Physical Therapy    Number of Visits Requested:  1  . Ambulatory referral to Psychiatry    Referral Priority:  Routine    Referral Type:  Psychiatric    Referral Reason:  Specialty Services Required    Requested Specialty:  Psychiatry    Number of Visits Requested:  1  . Ambulatory referral for Acupuncture    Referral Priority:  Routine    Referral Type:  Consultation    Referral Reason:  Specialty Services Required    Requested Specialty:  Joint Surgery  Number of Visits Requested:  1    Imaging Ordered: AMB REFERRAL TO PHYSICAL THERAPY AMB REFERRAL TO PSYCHIATRY AMB REFERRAL FOR ACUPUNCTURE  Interventional Therapies: Scheduled: None at this time. PRN Procedures:  1. Diagnostic bilateral cervical facet block under fluoroscopic guidance and IV sedation. 2. Diagnostic left cervical epidural steroid injection under fluoroscopic guidance and IV sedation    Referral(s) or Consult(s): Referral to physical therapy for aquatic therapy; referral for acupuncture; and referral for psychiatry to evaluate and treat her severe situational anxiety and depression.  Medications administered during this visit: Teresa [name] had no medications administered during this visit.  No future appointments.  Primary Care Physician: No primary care provider on file. Location: [facility] Note by: Katherinne Mofield A. Laban Emperor, M.D, DABA, DABAPM, DABPM, DABIPP, FIPP

## 2016-02-07 LAB — TOXASSURE SELECT 13 (MW), URINE: PDF: 0

## 2016-03-15 ENCOUNTER — Telehealth: Payer: Self-pay | Admitting: Pain Medicine

## 2016-03-15 NOTE — Telephone Encounter (Signed)
Patient has called in and requested a return appointment to discuss medical treatment with Dr Laban EmperorNaveira  Last MD orders are return PRN - I have LVM for the new adjuster Liliane BadePaul Corlette concerning a prior authorization  For a return visit to be scheduled.

## 2016-03-26 NOTE — Telephone Encounter (Signed)
I have again left a message to the new adjuster - need approval to schedule patient for return visit with Dr Laban EmperorNaveira

## 2016-04-17 NOTE — Telephone Encounter (Signed)
sw adjuster Renae Fickleaul this am and LVM for patient to schedule the approved evaluation/follow up appointment.

## 2016-04-24 NOTE — Telephone Encounter (Signed)
I have attempted to contact patient to schedule for the approved return appointment that she requested and WC has approved. No response from patient to date   04/17/2016 LVM @ 0925am 04/19/2016 LVM again @ 0343pm 04/24/2016 LVM again @ 0103pm

## 2016-09-19 ENCOUNTER — Encounter: Payer: Self-pay | Admitting: Pain Medicine

## 2016-09-19 ENCOUNTER — Ambulatory Visit: Payer: Worker's Compensation | Attending: Pain Medicine | Admitting: Pain Medicine

## 2016-09-19 VITALS — BP 157/82 | HR 56 | Temp 98.4°F | Resp 14 | Ht 67.0 in | Wt 135.0 lb

## 2016-09-19 DIAGNOSIS — M545 Low back pain: Secondary | ICD-10-CM

## 2016-09-19 DIAGNOSIS — M546 Pain in thoracic spine: Secondary | ICD-10-CM | POA: Diagnosis not present

## 2016-09-19 DIAGNOSIS — F1320 Sedative, hypnotic or anxiolytic dependence, uncomplicated: Secondary | ICD-10-CM | POA: Diagnosis not present

## 2016-09-19 DIAGNOSIS — M25559 Pain in unspecified hip: Secondary | ICD-10-CM | POA: Diagnosis not present

## 2016-09-19 DIAGNOSIS — K219 Gastro-esophageal reflux disease without esophagitis: Secondary | ICD-10-CM | POA: Diagnosis not present

## 2016-09-19 DIAGNOSIS — M6248 Contracture of muscle, other site: Secondary | ICD-10-CM

## 2016-09-19 DIAGNOSIS — M47812 Spondylosis without myelopathy or radiculopathy, cervical region: Secondary | ICD-10-CM

## 2016-09-19 DIAGNOSIS — M542 Cervicalgia: Secondary | ICD-10-CM

## 2016-09-19 DIAGNOSIS — Z79891 Long term (current) use of opiate analgesic: Secondary | ICD-10-CM | POA: Diagnosis not present

## 2016-09-19 DIAGNOSIS — M62838 Other muscle spasm: Secondary | ICD-10-CM | POA: Insufficient documentation

## 2016-09-19 DIAGNOSIS — M25512 Pain in left shoulder: Secondary | ICD-10-CM

## 2016-09-19 DIAGNOSIS — M25511 Pain in right shoulder: Secondary | ICD-10-CM

## 2016-09-19 DIAGNOSIS — F411 Generalized anxiety disorder: Secondary | ICD-10-CM | POA: Insufficient documentation

## 2016-09-19 DIAGNOSIS — M25552 Pain in left hip: Secondary | ICD-10-CM | POA: Insufficient documentation

## 2016-09-19 DIAGNOSIS — F1190 Opioid use, unspecified, uncomplicated: Secondary | ICD-10-CM

## 2016-09-19 DIAGNOSIS — M25551 Pain in right hip: Secondary | ICD-10-CM | POA: Insufficient documentation

## 2016-09-19 DIAGNOSIS — F17210 Nicotine dependence, cigarettes, uncomplicated: Secondary | ICD-10-CM | POA: Diagnosis not present

## 2016-09-19 DIAGNOSIS — G8929 Other chronic pain: Secondary | ICD-10-CM | POA: Insufficient documentation

## 2016-09-19 DIAGNOSIS — M797 Fibromyalgia: Secondary | ICD-10-CM | POA: Insufficient documentation

## 2016-09-19 DIAGNOSIS — M5412 Radiculopathy, cervical region: Secondary | ICD-10-CM | POA: Diagnosis not present

## 2016-09-19 DIAGNOSIS — M25562 Pain in left knee: Secondary | ICD-10-CM

## 2016-09-19 DIAGNOSIS — M25561 Pain in right knee: Secondary | ICD-10-CM

## 2016-09-19 MED ORDER — MELOXICAM 15 MG PO TABS: 15.0000 mg | ORAL_TABLET | Freq: Every day | ORAL | 0 refills | Status: DC

## 2016-09-19 MED ORDER — CYCLOBENZAPRINE HCL 10 MG PO TABS: 10.0000 mg | ORAL_TABLET | Freq: Three times a day (TID) | ORAL | 0 refills | Status: DC | PRN

## 2016-09-19 NOTE — Progress Notes (Signed)
Safety precautions to be maintained throughout the outpatient stay will include: orient to surroundings, keep bed in low position, maintain call bell within reach at all times, provide assistance with transfer out of bed and ambulation.  

## 2016-09-19 NOTE — Progress Notes (Signed)
Patient's Name: Teresa [name]  MRN: [id number]  Referring Provider: No ref. provider found  DOB: 1961/11/14  PCP: No primary care provider on file.  DOS: 09/19/2016  Note by: Sydnee Levans. Laban Emperor, MD  Service setting: Ambulatory outpatient  Specialty: Interventional Pain Management  Location: [facility] (AMB) Pain Management Facility    Patient type: Established   Primary Reason(s) for Visit: Encounter for prescription drug management (Level of risk: moderate) CC: Neck Pain  HPI  Teresa [name] is a 55 y.o. year old, female patient, who comes today for an initial evaluation. She has History of panic attacks; Encounter for therapeutic drug level monitoring; Long term current use of opiate analgesic; Benzodiazepine dependence (HCC); Uncomplicated opioid dependence (HCC); Opiate use; Chronic pain syndrome; Chronic upper back pain (WC); Thoracic back pain (WC); Hand pain; H/O cervical spine surgery; Chronic knee pain (Bilateral); Chronic low back pain; Cervical spondylosis (WC); Generalized anxiety disorder; Cervical facet syndrome (WC) (Bilateral) (L>R); Diffuse arthralgia; Tobacco use disorder; Fibromyalgia;  History of anterior cervical discectomy and fusion (ACDF) (left side)  (WC); Cervical radicular pain (WC) (Left); Long term prescription opiate use; Encounter for chronic pain management; Chronic pain (WC); 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 Her primarily concern today is the Neck Pain  Pain Assessment: Self-Reported Pain Score: 7 /10 Clinically the patient looks like a 2/10 Reported level is inconsistent with clinical observations.  Information on the proper use of the pain score provided to the patient today. Pain Type: Chronic pain Pain Location: Neck Pain Descriptors / Indicators: Sharp Pain Frequency: Intermittent  The patient comes into the clinics today for pharmacological management of her chronic pain. I last saw this patient on Visit date not found. The patient  reports that she does not use drugs. Her body mass index is 21.14 kg/m.  Date of Last Visit: 02/01/16 Service Provided on Last Visit: Med Refill  Controlled Substance Pharmacotherapy Assessment & REMS (Risk Evaluation and Mitigation Strategy)  Analgesic: No opioids Pharmacokinetics: Onset of action (Liberation/Absorption): Within expected pharmacological parameters Time to Peak effect (Distribution): Timing and results are as within normal expected parameters Duration of action (Metabolism/Excretion): Within normal limits for medication Pharmacodynamics: Analgesic Effect: More than 50% Activity Facilitation: Medication(s) allow patient to sit, stand, walk, and do the basic ADLs Perceived Effectiveness: Described as relatively effective, allowing for increase in activities of daily living (ADL) Side-effects or Adverse reactions: None reported Monitoring: [place] PMP: Online review of the past 58-month period conducted. Compliant with practice rules and regulations List of all UDS test(s) done:  Lab Results  Component Value Date   TOXASSSELUR FINAL 02/01/2016   TOXASSSELUR FINAL 12/01/2015   Last UDS on record: ToxAssure Select 13  Date Value Ref Range Status  02/01/2016 FINAL  Final    Comment:    ==================================================================== TOXASSURE SELECT 13 (MW) ==================================================================== Specimen Alert Note:  Urinary creatinine is low; ability to detect some drugs may be compromised.  Interpret results with  caution. ==================================================================== Test                             Result       Flag  Units Drug Present and Declared for Prescription Verification   Oxycodone                      1433         EXPECTED   ng/mg creat   Oxymorphone                    617          EXPECTED   ng/mg creat   Noroxycodone                   7083         EXPECTED   ng/mg creat    Sources of oxycodone include scheduled prescription medications.    Oxymorphone and noroxycodone are expected metabolites of    oxycodone. Oxymorphone is also available as a scheduled    prescription medication. ==================================================================== Test                      Result    Flag   Units      Ref Range   Creatinine              12        L      mg/dL      >=16 ==================================================================== Declared Medications:  The flagging and interpretation on this report are based on the  following declared medications.  Unexpected results may arise from  inaccuracies in the declared medications.  **Note: The testing scope of this panel includes these medications:  Oxycodone ==================================================================== For clinical consultation, please call [phone]. ====================================================================    UDS interpretation: Compliant          Medication Assessment Form: Reviewed. Patient indicates being compliant with therapy Treatment compliance: Compliant Risk Assessment: Aberrant Behavior: None observed today Substance Use Disorder (SUD) Risk Level: Low Risk of opioid abuse or dependence: 0.7-3.0% with doses ? 36 MME/day and 6.1-26% with doses ? 120 MME/day. Opioid Risk Tool (ORT) Score: 0 Low Risk for SUD (Score <3) Depression Scale Score: PHQ-2: 0 No depression (0) PHQ-9: 0 No depression (0-4)  Pharmacologic Plan: No change in therapy, at this  time  Laboratory Chemistry  Inflammation Markers Lab Results  Component Value Date   ESRSEDRATE 4 09/29/2014   Renal Function Lab Results  Component Value Date   BUN 14 09/29/2014   CREATININE 0.95 09/29/2014   GFRAA >60 09/29/2014   GFRNONAA >60 09/29/2014   Hepatic Function Lab Results  Component Value Date   AST 13 (L) 09/29/2014   ALT 21 09/29/2014   ALBUMIN 4.6 09/29/2014   Electrolytes Lab Results  Component Value Date   NA 137 09/29/2014   K 4.3 09/29/2014   CL 104 09/29/2014   CALCIUM 9.5 09/29/2014   MG 1.9 09/29/2014   Pain Modulating Vitamins No results found for: VD25OH, VD125OH2TOT, WJ1914NW2, NF6213YQ6, 25OHVITD1, 25OHVITD2, 25OHVITD3, VITAMINB12 Coagulation Parameters No results found for: INR, LABPROT, APTT, PLT Cardiovascular No results found for: BNP, HGB, HCT Note: Lab results reviewed.  Recent Diagnostic Imaging  No results found.  Meds  The patient has a current medication list which includes the following prescription(s): alprazolam, cyclobenzaprine, fluoxetine, and meloxicam.  Current Outpatient Prescriptions on File Prior to Visit  Medication Sig  . alprazolam (XANAX) 2 MG tablet Take 2 mg by mouth 2 (two) times daily as needed.   Marland Kitchen FLUoxetine (PROZAC) 20 MG tablet Take 20 mg by mouth daily.   No  current facility-administered medications on file prior to visit.     ROS  Constitutional: Denies any fever or chills Gastrointestinal: No reported hemesis, hematochezia, vomiting, or acute GI distress Musculoskeletal: Denies any acute onset joint swelling, redness, loss of ROM, or weakness Neurological: No reported episodes of acute onset apraxia, aphasia, dysarthria, agnosia, amnesia, paralysis, loss of coordination, or loss of consciousness  Allergies  Teresa [name] has no active allergies.  PFSH  Medical:  Teresa [name]  has a past medical history of  History of anterior cervical discectomy and fusion (ACDF) (left side). (10/18/2015);  Bilateral hip pain (10/18/2015); Cervical spine pain (WC) (10/18/2015); Cervical spondylosis with myelopathy; Chronic cervical radiculopathy (WC) (10/18/2015); Chronic constipation; Chronic pain syndrome; Depression; Fibromyalgia; GERD (gastroesophageal reflux disease); Insomnia; Migraine; Neck pain; and Panic attack. Family: family history is not on file. Surgical:  has a past surgical history that includes Neck surgery and btl. Tobacco:  reports that she has been smoking.  She has never used smokeless tobacco. Alcohol:  reports that she does not drink alcohol. Drug:  reports that she does not use drugs.  Constitutional Exam  General appearance: Well nourished, well developed, and well hydrated. In no acute distress Vitals:   09/19/16 1110  BP: (!) 157/82  Pulse: (!) 56  Resp: 14  Temp: 98.4 F (36.9 C)  TempSrc: Oral  SpO2: 100%  Weight: 135 lb (61.2 kg)  Height: 5\' 7"  (1.702 m)  BMI Assessment: Estimated body mass index is 21.14 kg/m as calculated from the following:   Height as of this encounter: 5\' 7"  (1.702 m).   Weight as of this encounter: 135 lb (61.2 kg).   BMI interpretation: (18.5-24.9 kg/m2) = Ideal body weight BMI Readings from Last 4 Encounters:  09/19/16 21.14 kg/m  02/01/16 20.36 kg/m  12/01/15 20.36 kg/m  10/18/15 21.14 kg/m   Wt Readings from Last 4 Encounters:  09/19/16 135 lb (61.2 kg)  02/01/16 130 lb (59 kg)  12/01/15 130 lb (59 kg)  10/18/15 135 lb (61.2 kg)  Psych/Mental status: Alert and oriented x 3 (person, place, & time) Eyes: PERLA Respiratory: No evidence of acute respiratory distress  Cervical Spine Exam  Inspection: No masses, redness, or swelling Alignment: Symmetrical Functional ROM: ROM appears unrestricted Stability: No instability detected Muscle strength & Tone: Functionally intact Sensory: Unimpaired Palpation: Non-contributory  Upper Extremity (UE) Exam    Side: Right upper extremity  Side: Left upper extremity   Inspection: No masses, redness, swelling, or asymmetry  Inspection: No masses, redness, swelling, or asymmetry  Functional ROM: ROM appears unrestricted          Functional ROM: ROM appears unrestricted          Muscle strength & Tone: Functionally intact  Muscle strength & Tone: Functionally intact  Sensory: Unimpaired  Sensory: Unimpaired  Palpation: Non-contributory  Palpation: Non-contributory   Thoracic Spine Exam  Inspection: No masses, redness, or swelling Alignment: Symmetrical Functional ROM: ROM appears unrestricted Stability: No instability detected Sensory: Unimpaired Muscle strength & Tone: Functionally intact Palpation: Non-contributory  Lumbar Spine Exam  Inspection: No masses, redness, or swelling Alignment: Symmetrical Functional ROM: ROM appears unrestricted Stability: No instability detected Muscle strength & Tone: Functionally intact Sensory: Unimpaired Palpation: Non-contributory Provocative Tests: Lumbar Hyperextension and rotation test: evaluation deferred today       Patrick's Maneuver: evaluation deferred today              Gait & Posture Assessment  Ambulation: Unassisted Gait: Relatively normal for age and body  habitus Posture: WNL   Lower Extremity Exam    Side: Right lower extremity  Side: Left lower extremity  Inspection: No masses, redness, swelling, or asymmetry  Inspection: No masses, redness, swelling, or asymmetry  Functional ROM: ROM appears unrestricted          Functional ROM: ROM appears unrestricted          Muscle strength & Tone: Functionally intact  Muscle strength & Tone: Functionally intact  Sensory: Unimpaired  Sensory: Unimpaired  Palpation: Non-contributory  Palpation: Non-contributory   Assessment  Primary Diagnosis & Pertinent Problem List: The primary encounter diagnosis was Chronic pain (WC). Diagnoses of Chronic neck pain (WC) (Location of Primary Source of Pain) (Bilateral) (L>R), Long term current use of opiate  analgesic, Opiate use, Benzodiazepine dependence (HCC), Cervical spondylosis (WC), Chronic hip pain, unspecified laterality, Chronic knee pain (Bilateral), Chronic low back pain, Chronic pain of both shoulders, Cervical radicular pain (WC) (Left), Muscle spasms of both lower extremities, and Muscle spasms of head or neck were also pertinent to this visit.  Visit Diagnosis: 1. Chronic pain (WC)   2. Chronic neck pain (WC) (Location of Primary Source of Pain) (Bilateral) (L>R)   3. Long term current use of opiate analgesic   4. Opiate use   5. Benzodiazepine dependence (HCC)   6. Cervical spondylosis (WC)   7. Chronic hip pain, unspecified laterality   8. Chronic knee pain (Bilateral)   9. Chronic low back pain   10. Chronic pain of both shoulders   11. Cervical radicular pain (WC) (Left)   12. Muscle spasms of both lower extremities   13. Muscle spasms of head or neck    Plan of Care   Problem List Items Addressed This Visit      High   Cervical radicular pain (WC) (Left) (Chronic)   Relevant Orders   Cervical Epidural Injection   Cervical spondylosis (WC) (Chronic)   Relevant Medications   cyclobenzaprine (FLEXERIL) 10 MG tablet   meloxicam (MOBIC) 15 MG tablet   Chronic hip pain (Bilateral) (Chronic)   Relevant Medications   cyclobenzaprine (FLEXERIL) 10 MG tablet   meloxicam (MOBIC) 15 MG tablet   Other Relevant Orders   DG HIP UNILAT W OR W/O PELVIS 2-3 VIEWS LEFT   DG HIP UNILAT W OR W/O PELVIS 2-3 VIEWS RIGHT   Chronic knee pain (Bilateral) (Chronic)   Relevant Medications   cyclobenzaprine (FLEXERIL) 10 MG tablet   meloxicam (MOBIC) 15 MG tablet   Other Relevant Orders   DG Knee 1-2 Views Left   DG Knee 1-2 Views Right   Chronic low back pain (Chronic)   Relevant Medications   cyclobenzaprine (FLEXERIL) 10 MG tablet   meloxicam (MOBIC) 15 MG tablet   Chronic neck pain (WC) (Location of Primary Source of Pain) (Bilateral) (L>R) (Chronic)   Relevant Medications    cyclobenzaprine (FLEXERIL) 10 MG tablet   meloxicam (MOBIC) 15 MG tablet   Chronic pain (WC) - Primary (Chronic)   Relevant Medications   cyclobenzaprine (FLEXERIL) 10 MG tablet   meloxicam (MOBIC) 15 MG tablet   Other Relevant Orders   Comprehensive metabolic panel   C-reactive protein   Magnesium   Sedimentation rate   Vitamin B12   25-Hydroxyvitamin D Lcms D2+D3   Chronic pain of both shoulders (Chronic)   Relevant Orders   DG Shoulder Left   DG Shoulder Right   Muscle spasms of head or neck (Chronic)   Relevant Medications   cyclobenzaprine (FLEXERIL) 10  MG tablet   Muscle spasms of lower extremity (Chronic)   Relevant Medications   cyclobenzaprine (FLEXERIL) 10 MG tablet     Medium   Benzodiazepine dependence (HCC)   Long term current use of opiate analgesic (Chronic)   Relevant Orders   ToxASSURE Select 13 (MW), Urine   Opiate use (Chronic)    Other Visit Diagnoses   None.    Pharmacotherapy (Medications Ordered): Meds ordered this encounter  Medications  . cyclobenzaprine (FLEXERIL) 10 MG tablet    Sig: Take 1 tablet (10 mg total) by mouth 3 (three) times daily as needed for muscle spasms.    Dispense:  90 tablet    Refill:  0    Do not place this medication, or any other prescription from our practice, on "Automatic Refill". Patient may have prescription filled one day early if pharmacy is closed on scheduled refill date.  . meloxicam (MOBIC) 15 MG tablet    Sig: Take 1 tablet (15 mg total) by mouth daily.    Dispense:  30 tablet    Refill:  0    Do not place this medication, or any other prescription from our practice, on "Automatic Refill". Patient may have prescription filled one day early if pharmacy is closed on scheduled refill date.   New Prescriptions   CYCLOBENZAPRINE (FLEXERIL) 10 MG TABLET    Take 1 tablet (10 mg total) by mouth 3 (three) times daily as needed for muscle spasms.   MELOXICAM (MOBIC) 15 MG TABLET    Take 1 tablet (15 mg total) by  mouth daily.   Medications administered during this visit: Teresa [name] had no medications administered during this visit. Lab-work, Procedure(s), & Referral(s) Ordered: Orders Placed This Encounter  Procedures  . Cervical Epidural Injection  . DG Shoulder Left  . DG Shoulder Right  . DG HIP UNILAT W OR W/O PELVIS 2-3 VIEWS LEFT  . DG HIP UNILAT W OR W/O PELVIS 2-3 VIEWS RIGHT  . DG Knee 1-2 Views Left  . DG Knee 1-2 Views Right  . ToxASSURE Select 13 (MW), Urine  . Comprehensive metabolic panel  . C-reactive protein  . Magnesium  . Sedimentation rate  . Vitamin B12  . 25-Hydroxyvitamin D Lcms D2+D3   Imaging & Referral(s) Ordered: DG SHOULDER LEFT DG SHOULDER RIGHT DG HIP UNILAT W OR W/O PELVIS 2-3 VIEWS LEFT DG HIP UNILAT W OR W/O PELVIS 2-3 VIEWS RIGHT DG KNEE 1-2 VIEWS LEFT DG KNEE 1-2 VIEWS RIGHT  Interventional Therapies: Scheduled:  Diagnostic Left CESI under fluoroscopy and IV sedation   Considering:  Diagnostic bilateral cervical facet block under fluoroscopic guidance and IV sedation. Diagnostic left cervical epidural steroid injection under fluoroscopic guidance and IV sedation    PRN Procedures:   Diagnostic bilateral cervical facet block under fluoroscopic guidance and IV sedation. Diagnostic left cervical epidural steroid injection under fluoroscopic guidance and IV sedation    Requested PM Follow-up: Return for 2nd Visit Eval after x-rays and lab-work, In addition, Schedule Procedure.  Future Appointments Date Time Provider Department Center  10/24/2016 10:20 AM Delano Metz, MD [facility] None    Primary Care Physician: No primary care provider on file. Location: [facility] Note by: Janeva Peaster A. Laban Emperor, M.D, DABA, DABAPM, DABPM, DABIPP, FIPP  Pain Score Disclaimer: We use the NRS-11 scale. This is a self-reported, subjective measurement of pain severity with only modest accuracy. It is used primarily to identify  changes within a particular patient. It must be understood that  outpatient pain scales are significantly less accurate that those used for research, where they can be applied under ideal controlled circumstances with minimal exposure to variables. In reality, the score is likely to be a combination of pain intensity and pain affect, where pain affect describes the degree of emotional arousal or changes in action readiness caused by the sensory experience of pain. Factors such as social and work situation, setting, emotional state, anxiety levels, expectation, and prior pain experience may influence pain perception and show large inter-individual differences that may also be affected by time variables.  Patient instructions provided during this appointment: Patient Instructions  You were given prescriptions for Flexeril and Meloxicam today. Get your labs and x-rays done as soon as possible. Epidural Steroid Injection Patient Information  Description: The epidural space surrounds the nerves as they exit the spinal cord.  In some patients, the nerves can be compressed and inflamed by a bulging disc or a tight spinal canal (spinal stenosis).  By injecting steroids into the epidural space, we can bring irritated nerves into direct contact with a potentially helpful medication.  These steroids act directly on the irritated nerves and can reduce swelling and inflammation which often leads to decreased pain.  Epidural steroids may be injected anywhere along the spine and from the neck to the low back depending upon the location of your pain.   After numbing the skin with local anesthetic (like Novocaine), a small needle is passed into the epidural space slowly.  You may experience a sensation of pressure while this is being done.  The entire block usually last less than 10 minutes.  Conditions which may be treated by epidural steroids:   Low back and leg pain  Neck and arm pain  Spinal  stenosis  Post-laminectomy syndrome  Herpes zoster (shingles) pain  Pain from compression fractures  Preparation for the injection:  1. Do not eat any solid food or dairy products within 8 hours of your appointment.  2. You may drink clear liquids up to 3 hours before appointment.  Clear liquids include water, black coffee, juice or soda.  No milk or cream please. 3. You may take your regular medication, including pain medications, with a sip of water before your appointment  Diabetics should hold regular insulin (if taken separately) and take 1/2 normal NPH dos the morning of the procedure.  Carry some sugar containing items with you to your appointment. 4. A driver must accompany you and be prepared to drive you home after your procedure.  5. Bring all your current medications with your. 6. An IV may be inserted and sedation may be given at the discretion of the physician.   7. A blood pressure cuff, EKG and other monitors will often be applied during the procedure.  Some patients may need to have extra oxygen administered for a short period. 8. You will be asked to provide medical information, including your allergies, prior to the procedure.  We must know immediately if you are taking blood thinners (like Coumadin/Warfarin)  Or if you are allergic to IV iodine contrast (dye). We must know if you could possible be pregnant.  Possible side-effects:  Bleeding from needle site  Infection (rare, may require surgery)  Nerve injury (rare)  Numbness & tingling (temporary)  Difficulty urinating (rare, temporary)  Spinal headache ( a headache worse with upright posture)  Light -headedness (temporary)  Pain at injection site (several days)  Decreased blood pressure (temporary)  Weakness in arm/leg (temporary)  Pressure  sensation in back/neck (temporary)  Call if you experience:  Fever/chills associated with headache or increased back/neck pain.  Headache worsened by an upright  position.  New onset weakness or numbness of an extremity below the injection site  Hives or difficulty breathing (go to the emergency room)  Inflammation or drainage at the infection site  Severe back/neck pain  Any new symptoms which are concerning to you  Please note:  Although the local anesthetic injected can often make your back or neck feel good for several hours after the injection, the pain will likely return.  It takes 3-7 days for steroids to work in the epidural space.  You may not notice any pain relief for at least that one week.  If effective, we will often do a series of three injections spaced 3-6 weeks apart to maximally decrease your pain.  After the initial series, we generally will wait several months before considering a repeat injection of the same type.  If you have any questions, please call [phone] [facility]

## 2016-09-19 NOTE — Patient Instructions (Addendum)
You were given prescriptions for Flexeril and Meloxicam today. Get your labs and x-rays done as soon as possible. Epidural Steroid Injection Patient Information  Description: The epidural space surrounds the nerves as they exit the spinal cord.  In some patients, the nerves can be compressed and inflamed by a bulging disc or a tight spinal canal (spinal stenosis).  By injecting steroids into the epidural space, we can bring irritated nerves into direct contact with a potentially helpful medication.  These steroids act directly on the irritated nerves and can reduce swelling and inflammation which often leads to decreased pain.  Epidural steroids may be injected anywhere along the spine and from the neck to the low back depending upon the location of your pain.   After numbing the skin with local anesthetic (like Novocaine), a small needle is passed into the epidural space slowly.  You may experience a sensation of pressure while this is being done.  The entire block usually last less than 10 minutes.  Conditions which may be treated by epidural steroids:   Low back and leg pain  Neck and arm pain  Spinal stenosis  Post-laminectomy syndrome  Herpes zoster (shingles) pain  Pain from compression fractures  Preparation for the injection:  1. Do not eat any solid food or dairy products within 8 hours of your appointment.  2. You may drink clear liquids up to 3 hours before appointment.  Clear liquids include water, black coffee, juice or soda.  No milk or cream please. 3. You may take your regular medication, including pain medications, with a sip of water before your appointment  Diabetics should hold regular insulin (if taken separately) and take 1/2 normal NPH dos the morning of the procedure.  Carry some sugar containing items with you to your appointment. 4. A driver must accompany you and be prepared to drive you home after your procedure.  5. Bring all your current medications with  your. 6. An IV may be inserted and sedation may be given at the discretion of the physician.   7. A blood pressure cuff, EKG and other monitors will often be applied during the procedure.  Some patients may need to have extra oxygen administered for a short period. 8. You will be asked to provide medical information, including your allergies, prior to the procedure.  We must know immediately if you are taking blood thinners (like Coumadin/Warfarin)  Or if you are allergic to IV iodine contrast (dye). We must know if you could possible be pregnant.  Possible side-effects:  Bleeding from needle site  Infection (rare, may require surgery)  Nerve injury (rare)  Numbness & tingling (temporary)  Difficulty urinating (rare, temporary)  Spinal headache ( a headache worse with upright posture)  Light -headedness (temporary)  Pain at injection site (several days)  Decreased blood pressure (temporary)  Weakness in arm/leg (temporary)  Pressure sensation in back/neck (temporary)  Call if you experience:  Fever/chills associated with headache or increased back/neck pain.  Headache worsened by an upright position.  New onset weakness or numbness of an extremity below the injection site  Hives or difficulty breathing (go to the emergency room)  Inflammation or drainage at the infection site  Severe back/neck pain  Any new symptoms which are concerning to you  Please note:  Although the local anesthetic injected can often make your back or neck feel good for several hours after the injection, the pain will likely return.  It takes 3-7 days for steroids to work  in the epidural space.  You may not notice any pain relief for at least that one week.  If effective, we will often do a series of three injections spaced 3-6 weeks apart to maximally decrease your pain.  After the initial series, we generally will wait several months before considering a repeat injection of the same  type.  If you have any questions, please call [phone] [facility]

## 2016-09-20 ENCOUNTER — Telehealth: Payer: Self-pay | Admitting: Pain Medicine

## 2016-09-20 NOTE — Telephone Encounter (Addendum)
Actual UDS speciman cup had no name on it and they could not process even with requistion, patient will need new speciman collected when she comes back to clinic

## 2016-09-23 ENCOUNTER — Encounter: Payer: Self-pay | Admitting: Pain Medicine

## 2016-09-27 LAB — TOXASSURE SELECT 13 (MW), URINE

## 2016-10-24 ENCOUNTER — Encounter: Payer: Self-pay | Admitting: Pain Medicine

## 2016-10-24 ENCOUNTER — Ambulatory Visit: Payer: Worker's Compensation | Attending: Pain Medicine | Admitting: Pain Medicine

## 2016-10-24 VITALS — BP 140/89 | HR 83 | Resp 16 | Ht 67.0 in | Wt 135.0 lb

## 2016-10-24 DIAGNOSIS — Z79899 Other long term (current) drug therapy: Secondary | ICD-10-CM | POA: Insufficient documentation

## 2016-10-24 DIAGNOSIS — M797 Fibromyalgia: Secondary | ICD-10-CM

## 2016-10-24 DIAGNOSIS — M542 Cervicalgia: Secondary | ICD-10-CM | POA: Diagnosis not present

## 2016-10-24 DIAGNOSIS — M62838 Other muscle spasm: Secondary | ICD-10-CM | POA: Diagnosis not present

## 2016-10-24 DIAGNOSIS — F17210 Nicotine dependence, cigarettes, uncomplicated: Secondary | ICD-10-CM | POA: Insufficient documentation

## 2016-10-24 DIAGNOSIS — F411 Generalized anxiety disorder: Secondary | ICD-10-CM | POA: Diagnosis not present

## 2016-10-24 DIAGNOSIS — Z79891 Long term (current) use of opiate analgesic: Secondary | ICD-10-CM | POA: Insufficient documentation

## 2016-10-24 DIAGNOSIS — M25551 Pain in right hip: Secondary | ICD-10-CM | POA: Diagnosis not present

## 2016-10-24 DIAGNOSIS — M792 Neuralgia and neuritis, unspecified: Secondary | ICD-10-CM

## 2016-10-24 DIAGNOSIS — M961 Postlaminectomy syndrome, not elsewhere classified: Secondary | ICD-10-CM

## 2016-10-24 DIAGNOSIS — G8929 Other chronic pain: Secondary | ICD-10-CM

## 2016-10-24 DIAGNOSIS — M25511 Pain in right shoulder: Secondary | ICD-10-CM | POA: Diagnosis not present

## 2016-10-24 DIAGNOSIS — M47812 Spondylosis without myelopathy or radiculopathy, cervical region: Secondary | ICD-10-CM | POA: Diagnosis not present

## 2016-10-24 DIAGNOSIS — M25512 Pain in left shoulder: Secondary | ICD-10-CM

## 2016-10-24 DIAGNOSIS — G894 Chronic pain syndrome: Secondary | ICD-10-CM | POA: Insufficient documentation

## 2016-10-24 DIAGNOSIS — M25552 Pain in left hip: Secondary | ICD-10-CM | POA: Insufficient documentation

## 2016-10-24 DIAGNOSIS — M5412 Radiculopathy, cervical region: Secondary | ICD-10-CM

## 2016-10-24 MED ORDER — GABAPENTIN 100 MG PO CAPS: 100.0000 mg | ORAL_CAPSULE | Freq: Three times a day (TID) | ORAL | 0 refills | Status: DC

## 2016-10-24 MED ORDER — CYCLOBENZAPRINE HCL 10 MG PO TABS: 10.0000 mg | ORAL_TABLET | Freq: Three times a day (TID) | ORAL | 5 refills | Status: DC | PRN

## 2016-10-24 MED ORDER — GABAPENTIN 100 MG PO CAPS: 100.0000 mg | ORAL_CAPSULE | Freq: Every day | ORAL | 0 refills | Status: DC

## 2016-10-24 MED ORDER — MELOXICAM 15 MG PO TABS: 15.0000 mg | ORAL_TABLET | Freq: Every day | ORAL | 5 refills | Status: DC

## 2016-10-24 MED ORDER — CYCLOBENZAPRINE HCL 5 MG PO TABS: 5.0000 mg | ORAL_TABLET | Freq: Two times a day (BID) | ORAL | 5 refills | Status: DC | PRN

## 2016-10-24 NOTE — Progress Notes (Signed)
Patient's Name: Teresa [name]  MRN: [id number]  Referring Provider: No ref. provider found  DOB: 09-Sep-1961  PCP: Lesli Albee, MD  DOS: 10/24/2016  Note by: Sydnee Levans. Laban Emperor, MD  Service setting: Ambulatory outpatient  Specialty: Interventional Pain Management  Location: [facility] (AMB) Pain Management Facility    Patient type: Established   Primary Reason(s) for Visit: Encounter for prescription drug management (Level of risk: moderate) CC: Neck Pain (lower ) and Leg Pain (left thigh)  HPI  Teresa [name] is a 55 y.o. year old, female patient, who comes today for an initial evaluation. She has History of panic attacks; Encounter for therapeutic drug level monitoring; Long term current use of opiate analgesic; Benzodiazepine dependence (HCC); Uncomplicated opioid dependence (HCC); Opiate use; Chronic pain syndrome; Chronic upper back pain (WC); Thoracic back pain (WC); Hand pain; H/O cervical spine surgery; Chronic knee pain (Bilateral); Chronic low back pain; Cervical spondylosis (WC); Generalized anxiety disorder; Cervical facet syndrome (WC) (Bilateral) (L>R); Diffuse arthralgia; Tobacco use disorder; Fibromyalgia;  History of anterior cervical discectomy and fusion (ACDF) (left side)  (WC); Cervical radicular pain (WC) (Left); Long term prescription opiate use; Encounter for chronic pain management; Chronic pain (WC); 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 Her primarily concern today is the Neck Pain (lower ) and Leg Pain (left thigh)  Pain Assessment: Self-Reported Pain Score: 10-Worst pain ever/10 Clinically the patient looks like a  3/10 Reported level is inconsistent with clinical observations. Information on the proper use of the pain score provided to the patient today. Pain Type: Chronic pain Pain Location: Neck Pain Orientation: Left Pain Descriptors / Indicators: Tingling, Discomfort, Pins and needles (stinging) Pain Frequency: Constant  Teresa [name] was last seen on 09/20/2016 for medication management. During today's appointment we reviewed Ms. Avino's chronic pain status, as well as her outpatient medication regimen. The patient did not get any of the lab work or x-rays done.  The patient  reports that she does not use drugs. Her body mass index is 21.14 kg/m.  Further details on both, my assessment(s), as well as the proposed treatment plan, please see below.  Controlled Substance Pharmacotherapy Assessment REMS (Risk Evaluation and Mitigation Strategy)  Analgesic: No opioids will be prescribed for this patient. Newman Pies, RN  10/24/2016 11:06 AM  Sign at close encounter Nursing Pain Medication Assessment:  Safety precautions to be maintained throughout the outpatient stay will include: orient to surroundings, keep bed in low position, maintain call bell within reach at all times, provide assistance with transfer out of bed and ambulation.  Medication Inspection Compliance: Pill count conducted under aseptic conditions, in front of the patient. Neither the pills nor the bottle was removed from the patient's sight at any time. Once count was completed pills were immediately returned to the patient in their original bottle. Pill Count: 7 of 90 pills remain Bottle Appearance: Standard pharmacy container. Clearly labeled. Medication: flexerill Filled Date: 09 / 20 / 2017 Medication Inspection Compliance: Pill count conducted under aseptic conditions, in front of the patient. Neither the pills nor the bottle was removed from the patient's sight at any time. Once count was completed pills were immediately  returned to the patient in their  original bottle. Pill Count:0 of 30 pills remain Bottle Appearance: Standard pharmacy container. Clearly labeled. Medication: meloxicam Filled Date: 09 / 20 / 2017  Laboratory Chemistry  Inflammation Markers Lab Results  Component Value Date   ESRSEDRATE 4 09/29/2014   Renal Function Lab Results  Component Value Date   BUN 14 09/29/2014   CREATININE 0.95 09/29/2014   GFRAA >60 09/29/2014   GFRNONAA >60 09/29/2014   Hepatic Function Lab Results  Component Value Date   AST 13 (L) 09/29/2014   ALT 21 09/29/2014   ALBUMIN 4.6 09/29/2014   Electrolytes Lab Results  Component Value Date   NA 137 09/29/2014   K 4.3 09/29/2014   CL 104 09/29/2014   CALCIUM 9.5 09/29/2014   MG 1.9 09/29/2014   Pain Modulating Vitamins No results found for: VD25OH, VD125OH2TOT, ZO1096EA5, WU9811BJ4, 25OHVITD1, 25OHVITD2, 25OHVITD3, VITAMINB12 Coagulation Parameters No results found for: INR, LABPROT, APTT, PLT Cardiovascular No results found for: BNP, HGB, HCT Note: Lab results reviewed.  Recent Diagnostic Imaging Review  No results found. Note: Imaging results reviewed.  Meds  The patient has a current medication list which includes the following prescription(s): alprazolam, fluoxetine, ibuprofen, cyclobenzaprine, gabapentin, and meloxicam.  Current Outpatient Prescriptions on File Prior to Visit  Medication Sig  . alprazolam (XANAX) 2 MG tablet Take 2 mg by mouth 2 (two) times daily as needed.   Marland Kitchen FLUoxetine (PROZAC) 20 MG tablet Take 20 mg by mouth daily.   No current facility-administered medications on file prior to visit.    ROS  Constitutional: Denies any fever or chills Gastrointestinal: No reported hemesis, hematochezia, vomiting, or acute GI distress Musculoskeletal: Denies any acute onset joint swelling, redness, loss of ROM, or weakness Neurological: No reported episodes of acute onset apraxia, aphasia, dysarthria, agnosia, amnesia,  paralysis, loss of coordination, or loss of consciousness  Allergies  Ms. Vanderkolk has no active allergies.  PFSH  Drug: Ms. Risdon  reports that she does not use drugs. Alcohol:  reports that she does not drink alcohol. Tobacco:  reports that she has been smoking.  She has never used smokeless tobacco. Medical:  has a past medical history of  History of anterior cervical discectomy and fusion (ACDF) (left side). (10/18/2015); Bilateral hip pain (10/18/2015); Cervical spine pain (WC) (10/18/2015); Cervical spondylosis with myelopathy; Chronic cervical radiculopathy (WC) (10/18/2015); Chronic constipation; Chronic pain syndrome; Depression; Fibromyalgia; GERD (gastroesophageal reflux disease); Insomnia; Migraine; Neck pain; and Panic attack. Family: family history is not on file.  Past Surgical History:  Procedure Laterality Date  . btl    . NECK SURGERY     Constitutional Exam  General appearance: Well nourished, well developed, and well hydrated. In no apparent acute distress Vitals:   10/24/16 1047  BP: 140/89  Pulse: 83  Resp: 16  SpO2: 100%  Weight: 135 lb (61.2 kg)  Height: 5\' 7"  (1.702 m)   BMI Assessment: Estimated body mass index is 21.14 kg/m as calculated from the following:   Height as of this encounter: 5\' 7"  (1.702 m).   Weight as of this encounter: 135 lb (61.2 kg).  BMI interpretation table: BMI level Category Range association with higher incidence of chronic pain  <18 kg/m2 Underweight   18.5-24.9 kg/m2 Ideal body weight   25-29.9 kg/m2 Overweight Increased incidence by 20%  30-34.9 kg/m2 Obese (Class I) Increased incidence by 68%  35-39.9 kg/m2 Severe obesity (Class II) Increased incidence by 136%  >40 kg/m2 Extreme obesity (Class III) Increased incidence by 254%   BMI  Readings from Last 4 Encounters:  10/24/16 21.14 kg/m  09/19/16 21.14 kg/m  02/01/16 20.36 kg/m  12/01/15 20.36 kg/m   Wt Readings from Last 4 Encounters:  10/24/16 135 lb (61.2 kg)   09/19/16 135 lb (61.2 kg)  02/01/16 130 lb (59 kg)  12/01/15 130 lb (59 kg)  Psych/Mental status: Alert, oriented x 3 (person, place, & time) Eyes: PERLA Respiratory: No evidence of acute respiratory distress  Cervical Spine Exam  Inspection: No masses, redness, or swelling Alignment: Symmetrical Functional ROM: Decreased ROM Stability: No instability detected Muscle strength & Tone: Inconsistent level of performance when tested Sensory: Movement-associated pain Palpation: Complains of area being tender to palpation  Upper Extremity (UE) Exam    Side: Right upper extremity  Side: Left upper extremity  Inspection: No masses, redness, swelling, or asymmetry  Inspection: No masses, redness, swelling, or asymmetry  Functional ROM: Unrestricted ROM         Functional ROM: Unrestricted ROM          Muscle strength & Tone: Functionally intact  Muscle strength & Tone: Functionally intact  Sensory: Unimpaired  Sensory: Unimpaired  Palpation: Non-contributory  Palpation: Non-contributory   Thoracic Spine Exam  Inspection: No masses, redness, or swelling Alignment: Symmetrical Functional ROM: Unrestricted ROM Stability: No instability detected Sensory: Unimpaired Muscle strength & Tone: Functionally intact Palpation: Non-contributory  Lumbar Spine Exam  Inspection: No masses, redness, or swelling Alignment: Symmetrical Functional ROM: Unrestricted ROM Stability: No instability detected Muscle strength & Tone: Functionally intact Sensory: Unimpaired Palpation: Non-contributory Provocative Tests: Lumbar Hyperextension and rotation test: evaluation deferred today       Patrick's Maneuver: evaluation deferred today              Gait & Posture Assessment  Ambulation: Unassisted Gait: Relatively normal for age and body habitus Posture: WNL   Lower Extremity Exam    Side: Right lower extremity  Side: Left lower extremity  Inspection: No masses, redness, swelling, or asymmetry   Inspection: No masses, redness, swelling, or asymmetry  Functional ROM: Unrestricted ROM          Functional ROM: Unrestricted ROM          Muscle strength & Tone: Functionally intact  Muscle strength & Tone: Functionally intact  Sensory: Unimpaired  Sensory: Unimpaired  Palpation: Non-contributory  Palpation: Non-contributory   Assessment  Primary Diagnosis & Pertinent Problem List: The primary encounter diagnosis was Cervical radicular pain (WC) (Left). 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  Visit Diagnosis: 1. Cervical radicular pain (WC) (Left)   2. Cervical spondylosis (WC)   3. Chronic neck pain (WC) (Location of Primary Source of Pain) (Bilateral) (L>R)   4. Chronic pain of both shoulders   5. 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 Fibromyalgia   7. Neurogenic pain   8. Muscle spasms of both lower extremities    Plan of Care  Pharmacotherapy (Medications Ordered): Meds ordered this encounter  Medications  . meloxicam (MOBIC) 15 MG tablet    Sig: Take 1 tablet (15 mg total) by mouth daily.    Dispense:  30 tablet    Refill:  5    Do not place this medication, or any other prescription from our practice, on "  Automatic Refill". Patient may have prescription filled one day early if pharmacy is closed on scheduled refill date.  . gabapentin (NEURONTIN) 100 MG capsule    Sig: Take 1-3 capsules (100-300 mg total) by mouth at bedtime. Follow titration schedule.    Dispense:  90 capsule    Refill:  0    Do not place this medication, or any other prescription from our practice, on "Automatic Refill". Patient may have prescription filled one day early if pharmacy is closed on scheduled  refill date.  . cyclobenzaprine (FLEXERIL) 5 MG tablet    Sig: Take 1 tablet (5 mg total) by mouth 2 (two) times daily as needed for muscle spasms.    Dispense:  60 tablet    Refill:  5    Do not place this medication, or any other prescription from our practice, on "Automatic Refill". Patient may have prescription filled one day early if pharmacy is closed on scheduled refill date.   New Prescriptions   CYCLOBENZAPRINE (FLEXERIL) 5 MG TABLET    Take 1 tablet (5 mg total) by mouth 2 (two) times daily as needed for muscle spasms.   GABAPENTIN (NEURONTIN) 100 MG CAPSULE    Take 1-3 capsules (100-300 mg total) by mouth at bedtime. Follow titration schedule.   Medications administered during this visit: Ms. Mazzarella had no medications administered during this visit. Lab-work, Procedure(s), & Referral(s) Ordered: Orders Placed This Encounter  Procedures  . Cervical Epidural Injection   Imaging & Referral(s) Ordered: None  Interventional Therapies: Pending/Scheduled/Planned:   Left CESI under fluoro and IV sedation.   Considering:   Left CESI under fluoro and IV sedation.   PRN Procedures:   None at this time.    Requested PM Follow-up: Return in about 6 months (around 04/24/2017) for Med-Mgmt, In addition, Schedule Procedure, (ASAA).  Future Appointments Date Time Provider Department Center  04/04/2017 10:30 AM Delano Metz, MD [facility] None   Primary Care Physician: Lesli Albee, MD Location: [facility] Facility Note by: Sydnee Levans. Laban Emperor, M.D, DABA, DABAPM, DABPM, DABIPP, FIPP  Pain Score Disclaimer: We use the NRS-11 scale. This is a self-reported, subjective measurement of pain severity with only modest accuracy. It is used primarily to identify changes within a particular patient. It must be understood that outpatient pain scales are significantly less accurate that those used for research, where they can be applied under ideal controlled  circumstances with minimal exposure to variables. In reality, the score is likely to be a combination of pain intensity and pain affect, where pain affect describes the degree of emotional arousal or changes in action readiness caused by the sensory experience of pain. Factors such as social and work situation, setting, emotional state, anxiety levels, expectation, and prior pain experience may influence pain perception and show large inter-individual differences that may also be affected by time variables.  Patient instructions provided during this appointment: Patient Instructions   Initial Gabapentin Titration  Medication used: Gabapentin (Generic Name) or Neurontin (Brand Name) 100 mg tablets/capsules  Reasons to stop increasing the dose:  Reason 1: You get good relief of symptoms, in which case there is no need to increase the daily dose any further.    Reason 2: You develop some side effects, such as sleeping all of the time, difficulty concentrating, or becoming disoriented, in which case you need to go down on the dose, to the prior level, where you were not experiencing any side effects. Stay on that dose longer, to allow more  time for your body to get use it, before attempting to increase it again.   Steps: Step 1: Start by taking 1 (one) tablet at bedtime x 7 (seven) days.  Step 2: After being on 1 (one) tablet for 7 (seven) days, then increase it to 2 (two) tablets at bedtime for another 7 (seven) days.  Step 3: Next, after being on 2 (two) tablets at bedtime for 7 (seven) days, then increase it to 3 (three) tablets at bedtime, and stay on that dose until you see your doctor.  Reasons to stop increasing the dose: Reason 1: You get good relief of symptoms, in which case there is no need to increase the daily dose any further.  Reason 2: You develop some side effects, such as sleeping all of the time, difficulty concentrating, or becoming disoriented, in which case you need to go  down on the dose, to the prior level, where you were not experiencing any side effects. Stay on that dose longer, to allow more time for your body to get use it, before attempting to increase it again.  Endpoint: Once you have reached the maximum dose you can tolerate without side-effects, contact your physician so as to evaluate the results of the regimen.   Questions: Feel free to contact us for any questions or problems at [phone] Epidural Steroid Injection An epidural steroid injection is given to relieve pain in your neck, back, or legs that is caused by the irritation or swelling of a nerve root. This procedure involves injecting a steroid and numbing medicine (anesthetic) into the epidural space. The epidural space is the space between the outer covering of your spinal cord and the bones that form your backbone (vertebra).  LET [facility] CARE PROVIDER KNOW ABOUT:   Any allergies you have.  All medicines you are taking, including vitamins, herbs, eye drops, creams, and over-the-counter medicines such as aspirin.  Previous problems you or members of your family have had with the use of anesthetics.  Any blood disorders or blood clotting disorders you have.  Previous surgeries you have had.  Medical conditions you have.  RISKS AND COMPLICATIONS Generally, this is a safe procedure. However, as with any procedure, complications can occur. Possible complications of epidural steroid injection include:  Headache.  Bleeding.  Infection.  Allergic reaction to the medicines.  Damage to your nerves. The response to this procedure depends on the underlying cause of the pain and its duration. People who have long-term (chronic) pain are less likely to benefit from epidural steroids than are those people whose pain comes on strong and suddenly.  BEFORE THE PROCEDURE   Ask your health care provider about changing or stopping your regular medicines. You may be advised to stop  taking blood-thinning medicines a few days before the procedure.  You may be given medicines to reduce anxiety.  Arrange for someone to take you home after the procedure.  PROCEDURE   You will remain awake during the procedure. You may receive medicine to make you relaxed.  You will be asked to lie on your stomach.  The injection site will be cleaned.  The injection site will be numbed with a medicine (local anesthetic).  A needle will be injected through your skin into the epidural space.  Your health care provider will use an X-ray machine to ensure that the steroid is delivered closest to the affected nerve. You may have minimal discomfort at this time.  Once the needle is in the  right position, the local anesthetic and the steroid will be injected into the epidural space.  The needle will then be removed and a bandage will be applied to the injection site.  AFTER THE PROCEDURE   You may be monitored for a short time before you go home.  You may feel weakness or numbness in your arm or leg, which disappears within hours.  You may be allowed to eat, drink, and take your regular medicine.  You may have soreness at the site of the injection.   This information is not intended to replace advice given to you by your health care provider. Make sure you discuss any questions you have with your health care provider.   Document Released: 03/25/2008 Document Revised: 08/19/2013 Document Reviewed: 06/05/2013 Elsevier Interactive Patient Education 2016 [employer]  GENERAL RISKS AND COMPLICATIONS  What are the risk, side effects and possible complications? Generally speaking, most procedures are safe.  However, with any procedure there are risks, side effects, and the possibility of complications.  The risks and complications are dependent upon the sites that are lesioned, or the type of nerve block to be performed.  The closer the procedure is to the spine, the more serious the  risks are.  Great care is taken when placing the radio frequency needles, block needles or lesioning probes, but sometimes complications can occur. Infection: Any time there is an injection through the skin, there is a risk of infection.  This is why sterile conditions are used for these blocks. There are four possible types of infection: 1. Localized skin infection. 2. Central Nervous System Infection: This can be in the form of Meningitis, which can be deadly. 3. Epidural Infections: This can be in the form of an epidural abscess, which can cause pressure inside of the spine, causing compression of the spinal cord with subsequent paralysis. This would require an emergency surgery to decompress, and there are no guarantees that the patient would recover from the paralysis. 4. Discitis: This is an infection of the intervertebral discs. It occurs in about 1% of discography procedures. It is difficult to treat and it may lead to surgery. Pain: the needles have to go through skin and soft tissues, will cause soreness. Damage to internal structures:  The nerves to be lesioned may be near blood vessels or other nerves which can be potentially damaged. Bleeding: Bleeding is more common if the patient is taking blood thinners such as  aspirin, Coumadin, Ticiid, Plavix, etc., or if he/she have some genetic predisposition such as hemophilia. Bleeding into the spinal canal can cause compression of the spinal  cord with subsequent paralysis.  This would require an emergency surgery to decompress and there are no guarantees that the patient would recover from the paralysis. Pneumothorax: Puncturing of a lung is a possibility, every time a needle is introduced in the area of the chest or upper back.  Pneumothorax refers to free air around the collapsed lung(s), inside of the thoracic cavity (chest cavity).  Another two possible complications related to a similar event would include: Hemothorax and Chylothorax. These are  variations of the Pneumothorax, where instead of air around the collapsed lung(s), you may have blood or chyle, respectively. Spinal headaches: They may occur with any procedures in the area of the spine. Persistent CSF (Cerebro-Spinal Fluid) leakage: This is a rare problem, but may occur with prolonged intrathecal or epidural catheters either due to the formation of a fistulous track or a dural tear. Nerve damage: By  working so close to the spinal cord, there is always a possibility of nerve damage, which could be as serious as a permanent spinal cord injury with paralysis. Death: Although rare, severe deadly allergic reactions known as "Anaphylactic reaction" can occur to any of the medications used. Worsening of the symptoms: We can always make thing worse.  What are the chances of something like this happening? Chances of any of this occuring are extremely low.  By statistics, you have more of a chance of getting killed in a motor vehicle accident: while driving to the hospital than any of the above occurring .  Nevertheless, you should be aware that they are possibilities.  In general, it is similar to taking a shower.  Everybody knows that you can slip, hit your head and get killed.  Does that mean that you should not shower again?  Nevertheless always keep in mind that statistics do not mean anything if you happen to be on the wrong side of them.  Even if a procedure has a 1 (one) in a 1,000,000 (million) chance of going wrong, it you happen to be that one..Also, keep in mind that by statistics, you have more of a chance of having something go wrong when taking medications.  Who should not have this procedure? If you are on a blood thinning medication (e.g. Coumadin, Plavix, see list of "Blood Thinners"), or if you have an active infection going on, you should not have the procedure.  If you are taking any blood thinners, please inform your physician.  Preparing for your procedure: 1. Do not eat  or drink anything at least eight (8) hours prior to the procedure. 2. Bring a driver with you .  It cannot be a taxi. 3. Come accompanied by an adult that can drive you back, and that is strong enough to help you if your legs get weak or numb from the local anesthetic. 4. Take all of your medicines the morning of the procedure with just enough water to swallow them. 5. If you have diabetes, make sure that you are scheduled to have your procedure done first thing in the morning, whenever possible. 6. If you have diabetes, take only half of your insulin dose and notify our nurse that you have done so as soon as you arrive at the clinic. 7. If you are diabetic, but only take blood sugar pills (oral hypoglycemic), then do not take them on the morning of your procedure.  You may take them after you have had the procedure. 8. Do not take aspirin or any aspirin-containing medications, at least eleven (11) days prior to the procedure.  They may prolong bleeding. 9. Wear loose fitting clothing that may be easy to take off and that you would not mind if it got stained with Betadine or blood. 10. Do not wear any jewelry or perfume 11. Remove any nail coloring.  It will interfere with some of our monitoring equipment. 12. If you take Metformin for your diabetes, stop it 48 hours prior to the procedure.  NOTE: Remember that this is not meant to be interpreted as a complete list of all possible complications.  Unforeseen problems may occur.  BLOOD THINNERS The following drugs contain aspirin or other products, which can cause increased bleeding during surgery and should not be taken for 2 weeks prior to and 1 week after surgery.  If you should need take something for relief of minor pain, you may take acetaminophen which is found in  Tylenol,m Datril, Anacin-3 and Panadol. It is not blood thinner. The products listed below are.  Do not take any of the products listed below in addition to any listed on your  instruction sheet.  A.P.C or A.P.C with Codeine Codeine Phosphate Capsules #3 Ibuprofen Ridaura  ABC compound Congesprin Imuran rimadil  Advil Cope Indocin Robaxisal  Alka-Seltzer Effervescent Pain Reliever and Antacid Coricidin or Coricidin-D  Indomethacin Rufen  Alka-Seltzer plus Cold Medicine Cosprin Ketoprofen S-A-C Tablets  Anacin Analgesic Tablets or Capsules Coumadin Korlgesic Salflex  Anacin Extra Strength Analgesic tablets or capsules CP-2 Tablets Lanoril Salicylate  Anaprox Cuprimine Capsules Levenox Salocol  Anexsia-D Dalteparin Magan Salsalate  Anodynos Darvon compound Magnesium Salicylate Sine-off  Ansaid Dasin Capsules Magsal Sodium Salicylate  Anturane Depen Capsules Marnal Soma  APF Arthritis pain formula Dewitt's Pills Measurin Stanback  Argesic Dia-Gesic Meclofenamic Sulfinpyrazone  Arthritis Bayer Timed Release Aspirin Diclofenac Meclomen Sulindac  Arthritis pain formula Anacin Dicumarol Medipren Supac  Analgesic (Safety coated) Arthralgen Diffunasal Mefanamic Suprofen  Arthritis Strength Bufferin Dihydrocodeine Mepro Compound Suprol  Arthropan liquid Dopirydamole Methcarbomol with Aspirin Synalgos  ASA tablets/Enseals Disalcid Micrainin Tagament  Ascriptin Doan's Midol Talwin  Ascriptin A/D Dolene Mobidin Tanderil  Ascriptin Extra Strength Dolobid Moblgesic Ticlid  Ascriptin with Codeine Doloprin or Doloprin with Codeine Momentum Tolectin  Asperbuf Duoprin Mono-gesic Trendar  Aspergum Duradyne Motrin or Motrin IB Triminicin  Aspirin plain, buffered or enteric coated Durasal Myochrisine Trigesic  Aspirin Suppositories Easprin Nalfon Trillsate  Aspirin with Codeine Ecotrin Regular or Extra Strength Naprosyn Uracel  Atromid-S Efficin Naproxen Ursinus  Auranofin Capsules Elmiron Neocylate Vanquish  Axotal Emagrin Norgesic Verin  Azathioprine Empirin or Empirin with Codeine Normiflo Vitamin E  Azolid Emprazil Nuprin Voltaren  Bayer Aspirin plain, buffered or  children's or timed BC Tablets or powders Encaprin Orgaran Warfarin Sodium  Buff-a-Comp Enoxaparin Orudis Zorpin  Buff-a-Comp with Codeine Equegesic Os-Cal-Gesic   Buffaprin Excedrin plain, buffered or Extra Strength Oxalid   Bufferin Arthritis Strength Feldene Oxphenbutazone   Bufferin plain or Extra Strength Feldene Capsules Oxycodone with Aspirin   Bufferin with Codeine Fenoprofen Fenoprofen Pabalate or Pabalate-SF   Buffets II Flogesic Panagesic   Buffinol plain or Extra Strength Florinal or Florinal with Codeine Panwarfarin   Buf-Tabs Flurbiprofen Penicillamine   Butalbital Compound Four-way cold tablets Penicillin   Butazolidin Fragmin Pepto-Bismol   Carbenicillin Geminisyn Percodan   Carna Arthritis Reliever Geopen Persantine   Carprofen Gold's salt Persistin   Chloramphenicol Goody's Phenylbutazone   Chloromycetin Haltrain Piroxlcam   Clmetidine heparin Plaquenil   Cllnoril Hyco-pap Ponstel   Clofibrate Hydroxy chloroquine Propoxyphen         Before stopping any of these medications, be sure to consult the physician who ordered them.  Some, such as Coumadin (Warfarin) are ordered to prevent or treat serious conditions such as "deep thrombosis", "pumonary embolisms", and other heart problems.  The amount of time that you may need off of the medication may also vary with the medication and the reason for which you were taking it.  If you are taking any of these medications, please make sure you notify your pain physician before you undergo any procedures.

## 2016-10-24 NOTE — Patient Instructions (Addendum)
Initial Gabapentin Titration  Medication used: Gabapentin (Generic Name) or Neurontin (Brand Name) 100 mg tablets/capsules  Reasons to stop increasing the dose:  Reason 1: You get good relief of symptoms, in which case there is no need to increase the daily dose any further.    Reason 2: You develop some side effects, such as sleeping all of the time, difficulty concentrating, or becoming disoriented, in which case you need to go down on the dose, to the prior level, where you were not experiencing any side effects. Stay on that dose longer, to allow more time for your body to get use it, before attempting to increase it again.   Steps: Step 1: Start by taking 1 (one) tablet at bedtime x 7 (seven) days.  Step 2: After being on 1 (one) tablet for 7 (seven) days, then increase it to 2 (two) tablets at bedtime for another 7 (seven) days.  Step 3: Next, after being on 2 (two) tablets at bedtime for 7 (seven) days, then increase it to 3 (three) tablets at bedtime, and stay on that dose until you see your doctor.  Reasons to stop increasing the dose: Reason 1: You get good relief of symptoms, in which case there is no need to increase the daily dose any further.  Reason 2: You develop some side effects, such as sleeping all of the time, difficulty concentrating, or becoming disoriented, in which case you need to go down on the dose, to the prior level, where you were not experiencing any side effects. Stay on that dose longer, to allow more time for your body to get use it, before attempting to increase it again.  Endpoint: Once you have reached the maximum dose you can tolerate without side-effects, contact your physician so as to evaluate the results of the regimen.   Questions: Feel free to contact us for any questions or problems at [phone] Epidural Steroid Injection An epidural steroid injection is given to relieve pain in your neck, back, or legs that is caused by the irritation or  swelling of a nerve root. This procedure involves injecting a steroid and numbing medicine (anesthetic) into the epidural space. The epidural space is the space between the outer covering of your spinal cord and the bones that form your backbone (vertebra).  [facility] PROVID[facility] KNOW ABOUT:   Any allergies you have.  All medicines you are taking, including vitamins, herbs, eye drops, creams, and over-the-counter medicines such as aspirin.  Previous problems you or members of your family have had with the use of anesthetics.  Any blood disorders or blood clotting disorders you have.  Previous surgeries you have had.  Medical conditions you have.  RISKS AND COMPLICATIONS Generally, this is a safe procedure. However, as with any procedure, complications can occur. Possible complications of epidural steroid injection include:  Headache.  Bleeding.  Infection.  Allergic reaction to the medicines.  Damage to your nerves. The response to this procedure depends on the underlying cause of the pain and its duration. People who have long-term (chronic) pain are less likely to benefit from epidural steroids than are those people whose pain comes on strong and suddenly.  BEFORE THE PROCEDURE   Ask your health care provider about changing or stopping your regular medicines. You may be advised to stop taking blood-thinning medicines a few days before the procedure.  You may be given medicines to reduce anxiety.  Arrange for someone to take you home after the procedure.  PROCEDURE   You will remain awake during the procedure. You may receive medicine to make you relaxed.  You will be asked to lie on your stomach.  The injection site will be cleaned.  The injection site will be numbed with a medicine (local anesthetic).  A needle will be injected through your skin into the epidural space.  Your health care provider will use an X-ray machine to ensure that the steroid is  delivered closest to the affected nerve. You may have minimal discomfort at this time.  Once the needle is in the right position, the local anesthetic and the steroid will be injected into the epidural space.  The needle will then be removed and a bandage will be applied to the injection site.  AFTER THE PROCEDURE   You may be monitored for a short time before you go home.  You may feel weakness or numbness in your arm or leg, which disappears within hours.  You may be allowed to eat, drink, and take your regular medicine.  You may have soreness at the site of the injection.   This information is not intended to replace advice given to you by your health care provider. Make sure you discuss any questions you have with your health care provider.   Document Released: 03/25/2008 Document Revised: 08/19/2013 Document Reviewed: 06/05/2013 Elsevier Interactive Patient Education 2016 [employer]  GENERAL RISKS AND COMPLICATIONS  What are the risk, side effects and possible complications? Generally speaking, most procedures are safe.  However, with any procedure there are risks, side effects, and the possibility of complications.  The risks and complications are dependent upon the sites that are lesioned, or the type of nerve block to be performed.  The closer the procedure is to the spine, the more serious the risks are.  Great care is taken when placing the radio frequency needles, block needles or lesioning probes, but sometimes complications can occur. Infection: Any time there is an injection through the skin, there is a risk of infection.  This is why sterile conditions are used for these blocks. There are four possible types of infection: 1. Localized skin infection. 2. Central Nervous System Infection: This can be in the form of Meningitis, which can be deadly. 3. Epidural Infections: This can be in the form of an epidural abscess, which can cause pressure inside of the spine, causing  compression of the spinal cord with subsequent paralysis. This would require an emergency surgery to decompress, and there are no guarantees that the patient would recover from the paralysis. 4. Discitis: This is an infection of the intervertebral discs. It occurs in about 1% of discography procedures. It is difficult to treat and it may lead to surgery. Pain: the needles have to go through skin and soft tissues, will cause soreness. Damage to internal structures:  The nerves to be lesioned may be near blood vessels or other nerves which can be potentially damaged. Bleeding: Bleeding is more common if the patient is taking blood thinners such as  aspirin, Coumadin, Ticiid, Plavix, etc., or if he/she have some genetic predisposition such as hemophilia. Bleeding into the spinal canal can cause compression of the spinal  cord with subsequent paralysis.  This would require an emergency surgery to decompress and there are no guarantees that the patient would recover from the paralysis. Pneumothorax: Puncturing of a lung is a possibility, every time a needle is introduced in the area of the chest or upper back.  Pneumothorax refers to free  air around the collapsed lung(s), inside of the thoracic cavity (chest cavity).  Another two possible complications related to a similar event would include: Hemothorax and Chylothorax. These are variations of the Pneumothorax, where instead of air around the collapsed lung(s), you may have blood or chyle, respectively. Spinal headaches: They may occur with any procedures in the area of the spine. Persistent CSF (Cerebro-Spinal Fluid) leakage: This is a rare problem, but may occur with prolonged intrathecal or epidural catheters either due to the formation of a fistulous track or a dural tear. Nerve damage: By working so close to the spinal cord, there is always a possibility of nerve damage, which could be as serious as a permanent spinal cord injury with paralysis. Death:  Although rare, severe deadly allergic reactions known as "Anaphylactic reaction" can occur to any of the medications used. Worsening of the symptoms: We can always make thing worse.  What are the chances of something like this happening? Chances of any of this occuring are extremely low.  By statistics, you have more of a chance of getting killed in a motor vehicle accident: while driving to the hospital than any of the above occurring .  Nevertheless, you should be aware that they are possibilities.  In general, it is similar to taking a shower.  Everybody knows that you can slip, hit your head and get killed.  Does that mean that you should not shower again?  Nevertheless always keep in mind that statistics do not mean anything if you happen to be on the wrong side of them.  Even if a procedure has a 1 (one) in a 1,000,000 (million) chance of going wrong, it you happen to be that one..Also, keep in mind that by statistics, you have more of a chance of having something go wrong when taking medications.  Who should not have this procedure? If you are on a blood thinning medication (e.g. Coumadin, Plavix, see list of "Blood Thinners"), or if you have an active infection going on, you should not have the procedure.  If you are taking any blood thinners, please inform your physician.  Preparing for your procedure: 1. Do not eat or drink anything at least eight (8) hours prior to the procedure. 2. Bring a driver with you .  It cannot be a taxi. 3. Come accompanied by an adult that can drive you back, and that is strong enough to help you if your legs get weak or numb from the local anesthetic. 4. Take all of your medicines the morning of the procedure with just enough water to swallow them. 5. If you have diabetes, make sure that you are scheduled to have your procedure done first thing in the morning, whenever possible. 6. If you have diabetes, take only half of your insulin dose and notify our nurse  that you have done so as soon as you arrive at the clinic. 7. If you are diabetic, but only take blood sugar pills (oral hypoglycemic), then do not take them on the morning of your procedure.  You may take them after you have had the procedure. 8. Do not take aspirin or any aspirin-containing medications, at least eleven (11) days prior to the procedure.  They may prolong bleeding. 9. Wear loose fitting clothing that may be easy to take off and that you would not mind if it got stained with Betadine or blood. 10. Do not wear any jewelry or perfume 11. Remove any nail coloring.  It will interfere with  some of our monitoring equipment. 12. If you take Metformin for your diabetes, stop it 48 hours prior to the procedure.  NOTE: Remember that this is not meant to be interpreted as a complete list of all possible complications.  Unforeseen problems may occur.  BLOOD THINNERS The following drugs contain aspirin or other products, which can cause increased bleeding during surgery and should not be taken for 2 weeks prior to and 1 week after surgery.  If you should need take something for relief of minor pain, you may take acetaminophen which is found in Tylenol,m Datril, Anacin-3 and Panadol. It is not blood thinner. The products listed below are.  Do not take any of the products listed below in addition to any listed on your instruction sheet.  A.P.C or A.P.C with Codeine Codeine Phosphate Capsules #3 Ibuprofen Ridaura  ABC compound Congesprin Imuran rimadil  Advil Cope Indocin Robaxisal  Alka-Seltzer Effervescent Pain Reliever and Antacid Coricidin or Coricidin-D  Indomethacin Rufen  Alka-Seltzer plus Cold Medicine Cosprin Ketoprofen S-A-C Tablets  Anacin Analgesic Tablets or Capsules Coumadin Korlgesic Salflex  Anacin Extra Strength Analgesic tablets or capsules CP-2 Tablets Lanoril Salicylate  Anaprox Cuprimine Capsules Levenox Salocol  Anexsia-D Dalteparin Magan Salsalate  Anodynos Darvon  compound Magnesium Salicylate Sine-off  Ansaid Dasin Capsules Magsal Sodium Salicylate  Anturane Depen Capsules Marnal Soma  APF Arthritis pain formula Dewitt's Pills Measurin Stanback  Argesic Dia-Gesic Meclofenamic Sulfinpyrazone  Arthritis Bayer Timed Release Aspirin Diclofenac Meclomen Sulindac  Arthritis pain formula Anacin Dicumarol Medipren Supac  Analgesic (Safety coated) Arthralgen Diffunasal Mefanamic Suprofen  Arthritis Strength Bufferin Dihydrocodeine Mepro Compound Suprol  Arthropan liquid Dopirydamole Methcarbomol with Aspirin Synalgos  ASA tablets/Enseals Disalcid Micrainin Tagament  Ascriptin Doan's Midol Talwin  Ascriptin A/D Dolene Mobidin Tanderil  Ascriptin Extra Strength Dolobid Moblgesic Ticlid  Ascriptin with Codeine Doloprin or Doloprin with Codeine Momentum Tolectin  Asperbuf Duoprin Mono-gesic Trendar  Aspergum Duradyne Motrin or Motrin IB Triminicin  Aspirin plain, buffered or enteric coated Durasal Myochrisine Trigesic  Aspirin Suppositories Easprin Nalfon Trillsate  Aspirin with Codeine Ecotrin Regular or Extra Strength Naprosyn Uracel  Atromid-S Efficin Naproxen Ursinus  Auranofin Capsules Elmiron Neocylate Vanquish  Axotal Emagrin Norgesic Verin  Azathioprine Empirin or Empirin with Codeine Normiflo Vitamin E  Azolid Emprazil Nuprin Voltaren  Bayer Aspirin plain, buffered or children's or timed BC Tablets or powders Encaprin Orgaran Warfarin Sodium  Buff-a-Comp Enoxaparin Orudis Zorpin  Buff-a-Comp with Codeine Equegesic Os-Cal-Gesic   Buffaprin Excedrin plain, buffered or Extra Strength Oxalid   Bufferin Arthritis Strength Feldene Oxphenbutazone   Bufferin plain or Extra Strength Feldene Capsules Oxycodone with Aspirin   Bufferin with Codeine Fenoprofen Fenoprofen Pabalate or Pabalate-SF   Buffets II Flogesic Panagesic   Buffinol plain or Extra Strength Florinal or Florinal with Codeine Panwarfarin   Buf-Tabs Flurbiprofen Penicillamine   Butalbital  Compound Four-way cold tablets Penicillin   Butazolidin Fragmin Pepto-Bismol   Carbenicillin Geminisyn Percodan   Carna Arthritis Reliever Geopen Persantine   Carprofen Gold's salt Persistin   Chloramphenicol Goody's Phenylbutazone   Chloromycetin Haltrain Piroxlcam   Clmetidine heparin Plaquenil   Cllnoril Hyco-pap Ponstel   Clofibrate Hydroxy chloroquine Propoxyphen         Before stopping any of these medications, be sure to consult the physician who ordered them.  Some, such as Coumadin (Warfarin) are ordered to prevent or treat serious conditions such as "deep thrombosis", "pumonary embolisms", and other heart problems.  The amount of time that you may need off of the medication  may also vary with the medication and the reason for which you were taking it.  If you are taking any of these medications, please make sure you notify your pain physician before you undergo any procedures.

## 2016-10-24 NOTE — Progress Notes (Signed)
Nursing Pain Medication Assessment:  Safety precautions to be maintained throughout the outpatient stay will include: orient to surroundings, keep bed in low position, maintain call bell within reach at all times, provide assistance with transfer out of bed and ambulation.  Medication Inspection Compliance: Pill count conducted under aseptic conditions, in front of the patient. Neither the pills nor the bottle was removed from the patient's sight at any time. Once count was completed pills were immediately returned to the patient in their original bottle. Pill Count: 7 of 90 pills remain Bottle Appearance: Standard pharmacy container. Clearly labeled. Medication: flexerill Filled Date: 09 / 20 / 2017 Medication Inspection Compliance: Pill count conducted under aseptic conditions, in front of the patient. Neither the pills nor the bottle was removed from the patient's sight at any time. Once count was completed pills were immediately returned to the patient in their original bottle. Pill Count:0 of 30 pills remain Bottle Appearance: Standard pharmacy container. Clearly labeled. Medication: meloxicam Filled Date: 09 / 20 / 2017

## 2017-01-07 ENCOUNTER — Ambulatory Visit: Payer: Worker's Compensation | Attending: Pain Medicine | Admitting: Pain Medicine

## 2017-01-07 ENCOUNTER — Encounter: Payer: Self-pay | Admitting: Pain Medicine

## 2017-01-07 VITALS — BP 127/71 | HR 70 | Temp 98.2°F | Resp 16 | Ht 67.0 in | Wt 140.0 lb

## 2017-01-07 DIAGNOSIS — G8929 Other chronic pain: Secondary | ICD-10-CM | POA: Diagnosis not present

## 2017-01-07 DIAGNOSIS — M542 Cervicalgia: Secondary | ICD-10-CM

## 2017-01-07 NOTE — Progress Notes (Signed)
Today we postponed the procedure because the patient did not keep her nothing by mouth orders. The patient was processed as a nurse visit.

## 2017-01-07 NOTE — Patient Instructions (Signed)
Epidural Steroid Injection Patient Information  Description: The epidural space surrounds the nerves as they exit the spinal cord.  In some patients, the nerves can be compressed and inflamed by a bulging disc or a tight spinal canal (spinal stenosis).  By injecting steroids into the epidural space, we can bring irritated nerves into direct contact with a potentially helpful medication.  These steroids act directly on the irritated nerves and can reduce swelling and inflammation which often leads to decreased pain.  Epidural steroids may be injected anywhere along the spine and from the neck to the low back depending upon the location of your pain.   After numbing the skin with local anesthetic (like Novocaine), a small needle is passed into the epidural space slowly.  You may experience a sensation of pressure while this is being done.  The entire block usually last less than 10 minutes.  Conditions which may be treated by epidural steroids:   Low back and leg pain  Neck and arm pain  Spinal stenosis  Post-laminectomy syndrome  Herpes zoster (shingles) pain  Pain from compression fractures  Preparation for the injection:  1. Do not eat any solid food or dairy products within 8 hours of your appointment.  2. You may drink clear liquids up to 3 hours before appointment.  Clear liquids include water, black coffee, juice or soda.  No milk or cream please. 3. You may take your regular medication, including pain medications, with a sip of water before your appointment  Diabetics should hold regular insulin (if taken separately) and take 1/2 normal NPH dos the morning of the procedure.  Carry some sugar containing items with you to your appointment. 4. A driver must accompany you and be prepared to drive you home after your procedure.  5. Bring all your current medications with your. 6. An IV may be inserted and sedation may be given at the discretion of the physician.   7. A blood pressure  cuff, EKG and other monitors will often be applied during the procedure.  Some patients may need to have extra oxygen administered for a short period. 8. You will be asked to provide medical information, including your allergies, prior to the procedure.  We must know immediately if you are taking blood thinners (like Coumadin/Warfarin)  Or if you are allergic to IV iodine contrast (dye). We must know if you could possible be pregnant.  Possible side-effects:  Bleeding from needle site  Infection (rare, may require surgery)  Nerve injury (rare)  Numbness & tingling (temporary)  Difficulty urinating (rare, temporary)  Spinal headache ( a headache worse with upright posture)  Light -headedness (temporary)  Pain at injection site (several days)  Decreased blood pressure (temporary)  Weakness in arm/leg (temporary)  Pressure sensation in back/neck (temporary)  Call if you experience:  Fever/chills associated with headache or increased back/neck pain.  Headache worsened by an upright position.  New onset weakness or numbness of an extremity below the injection site  Hives or difficulty breathing (go to the emergency room)  Inflammation or drainage at the infection site  Severe back/neck pain  Any new symptoms which are concerning to you  Please note:  Although the local anesthetic injected can often make your back or neck feel good for several hours after the injection, the pain will likely return.  It takes 3-7 days for steroids to work in the epidural space.  You may not notice any pain relief for at least that one week.    If effective, we will often do a series of three injections spaced 3-6 weeks apart to maximally decrease your pain.  After the initial series, we generally will wait several months before considering a repeat injection of the same type.  If you have any questions, please call [phone] [facility] 

## 2017-01-09 ENCOUNTER — Ambulatory Visit: Payer: Worker's Compensation | Attending: Pain Medicine | Admitting: Pain Medicine

## 2017-01-09 ENCOUNTER — Ambulatory Visit: Admission: RE | Admit: 2017-01-09 | Payer: Self-pay | Source: Ambulatory Visit

## 2017-01-09 ENCOUNTER — Encounter: Payer: Self-pay | Admitting: Pain Medicine

## 2017-01-09 ENCOUNTER — Ambulatory Visit
Admission: RE | Admit: 2017-01-09 | Discharge: 2017-01-09 | Disposition: A | Discharge status: Home / Self Care | Payer: Self-pay | Source: Ambulatory Visit | Attending: Pain Medicine | Admitting: Pain Medicine

## 2017-01-09 VITALS — BP 139/69 | HR 60 | Temp 98.4°F | Resp 18 | Ht 67.0 in | Wt 145.0 lb

## 2017-01-09 DIAGNOSIS — G894 Chronic pain syndrome: Secondary | ICD-10-CM | POA: Diagnosis not present

## 2017-01-09 DIAGNOSIS — M47812 Spondylosis without myelopathy or radiculopathy, cervical region: Secondary | ICD-10-CM | POA: Diagnosis not present

## 2017-01-09 DIAGNOSIS — M62831 Muscle spasm of calf: Secondary | ICD-10-CM | POA: Diagnosis not present

## 2017-01-09 DIAGNOSIS — M797 Fibromyalgia: Secondary | ICD-10-CM | POA: Diagnosis not present

## 2017-01-09 DIAGNOSIS — M4722 Other spondylosis with radiculopathy, cervical region: Secondary | ICD-10-CM | POA: Diagnosis not present

## 2017-01-09 DIAGNOSIS — M792 Neuralgia and neuritis, unspecified: Secondary | ICD-10-CM | POA: Diagnosis not present

## 2017-01-09 DIAGNOSIS — M5412 Radiculopathy, cervical region: Secondary | ICD-10-CM

## 2017-01-09 DIAGNOSIS — M62838 Other muscle spasm: Secondary | ICD-10-CM

## 2017-01-09 MED ORDER — FENTANYL CITRATE (PF) 100 MCG/2ML IJ SOLN
25.0000 ug | INTRAMUSCULAR | Status: AC | PRN
  Administered 2017-01-09 (×2): 50 ug via INTRAVENOUS

## 2017-01-09 MED ORDER — MIDAZOLAM HCL 5 MG/5ML IJ SOLN
INTRAMUSCULAR | Status: AC
  Administered 2017-01-09: 2 mg via INTRAVENOUS
  Filled 2017-01-09: qty 5

## 2017-01-09 MED ORDER — ROPIVACAINE HCL 2 MG/ML IJ SOLN
INTRAMUSCULAR | Status: AC
  Administered 2017-01-09: 12:00:00
  Filled 2017-01-09: qty 10

## 2017-01-09 MED ORDER — SODIUM CHLORIDE 0.9 % IJ SOLN
INTRAMUSCULAR | Status: AC
  Administered 2017-01-09: 12:00:00
  Filled 2017-01-09: qty 10

## 2017-01-09 MED ORDER — LIDOCAINE HCL (PF) 1 % IJ SOLN
INTRAMUSCULAR | Status: AC
  Filled 2017-01-09: qty 5

## 2017-01-09 MED ORDER — SODIUM CHLORIDE 0.9% FLUSH: 1.0000 mL | Freq: Once | INTRAVENOUS | Status: DC

## 2017-01-09 MED ORDER — DEXAMETHASONE SODIUM PHOSPHATE 4 MG/ML IJ SOLN
10.0000 mg | Freq: Once | INTRAMUSCULAR | Status: AC
  Administered 2017-01-09: 12:00:00

## 2017-01-09 MED ORDER — MIDAZOLAM HCL 5 MG/5ML IJ SOLN
1.0000 mg | INTRAMUSCULAR | Status: DC | PRN
  Administered 2017-01-09: 1 mg via INTRAVENOUS
  Administered 2017-01-09: 2 mg via INTRAVENOUS

## 2017-01-09 MED ORDER — IOPAMIDOL (ISOVUE-M 200) INJECTION 41%
10.0000 mL | Freq: Once | INTRAMUSCULAR | Status: AC
  Administered 2017-01-09: 10 mL via EPIDURAL
  Filled 2017-01-09: qty 10

## 2017-01-09 MED ORDER — LIDOCAINE HCL (PF) 1 % IJ SOLN
10.0000 mL | Freq: Once | INTRAMUSCULAR | Status: AC
Start: 2017-01-09 — End: 2017-01-09
  Administered 2017-01-09: 10 mL

## 2017-01-09 MED ORDER — DEXAMETHASONE SODIUM PHOSPHATE 4 MG/ML IJ SOLN
INTRAMUSCULAR | Status: AC
  Filled 2017-01-09: qty 3

## 2017-01-09 MED ORDER — GABAPENTIN 100 MG PO CAPS: 100.0000 mg | ORAL_CAPSULE | Freq: Every day | ORAL | 0 refills | Status: DC

## 2017-01-09 MED ORDER — MELOXICAM 15 MG PO TABS
15.0000 mg | ORAL_TABLET | Freq: Every day | ORAL | 5 refills | Status: DC
Start: 2017-01-09 — End: 2017-06-27

## 2017-01-09 MED ORDER — LACTATED RINGERS IV SOLN
1000.0000 mL | Freq: Once | INTRAVENOUS | Status: AC
  Administered 2017-01-09: 1000 mL via INTRAVENOUS

## 2017-01-09 MED ORDER — ROPIVACAINE HCL 2 MG/ML IJ SOLN: 1.0000 mL | Freq: Once | INTRAMUSCULAR | Status: DC

## 2017-01-09 MED ORDER — FENTANYL CITRATE (PF) 100 MCG/2ML IJ SOLN
INTRAMUSCULAR | Status: AC
  Administered 2017-01-09: 50 ug via INTRAVENOUS
  Filled 2017-01-09: qty 2

## 2017-01-09 MED ORDER — CYCLOBENZAPRINE HCL 5 MG PO TABS: 5.0000 mg | ORAL_TABLET | Freq: Two times a day (BID) | ORAL | 5 refills | Status: DC | PRN

## 2017-01-09 NOTE — Patient Instructions (Signed)
Pain Management Discharge Instructions  General Discharge Instructions :  If you need to reach your doctor call: Monday-Friday 8:00 am - 4:00 pm at [phone] or toll free [phone].  After clinic hours [phone] to have operator reach doctor.  Bring all of your medication bottles to all your appointments in the pain clinic.  To cancel or reschedule your appointment with Pain Management please remember to call 24 hours in advance to avoid a fee.  Refer to the educational materials which you have been given on: General Risks, I had my Procedure. Discharge Instructions, Post Sedation.  Post Procedure Instructions:  The drugs you were given will stay in your system until tomorrow, so for the next 24 hours you should not drive, make any legal decisions or drink any alcoholic beverages.  You may eat anything you prefer, but it is better to start with liquids then soups and crackers, and gradually work up to solid foods.  Please notify your doctor immediately if you have any unusual bleeding, trouble breathing or pain that is not related to your normal pain.  Depending on the type of procedure that was done, some parts of your body may feel week and/or numb.  This usually clears up by tonight or the next day.  Walk with the use of an assistive device or accompanied by an adult for the 24 hours.  You may use ice on the affected area for the first 24 hours.  Put ice in a Ziploc bag and cover with a towel and place against area 15 minutes on 15 minutes off.  You may switch to heat after 24 hours.Epidural Steroid Injection An epidural steroid injection is a shot of steroid medicine and numbing medicine that is given into the space between the spinal cord and the bones in your back (epidural space). The shot helps relieve pain caused by an irritated or swollen nerve root. The amount of pain relief you get from the injection depends on what is causing the nerve to be swollen and irritated,  and how long your pain lasts. You are more likely to benefit from this injection if your pain is strong and comes on suddenly rather than if you have had pain for a long time. Tell a health care provider about:  Any allergies you have.  All medicines you are taking, including vitamins, herbs, eye drops, creams, and over-the-counter medicines.  Any problems you or family members have had with anesthetic medicines.  Any blood disorders you have.  Any surgeries you have had.  Any medical conditions you have.  Whether you are pregnant or may be pregnant. What are the risks? Generally, this is a safe procedure. However, problems may occur, including:  Headache.  Bleeding.  Infection.  Allergic reaction to medicines.  Damage to your nerves. What happens before the procedure? Staying hydrated  Follow instructions from your health care provider about hydration, which may include:  Up to 2 hours before the procedure - you may continue to drink clear liquids, such as water, clear fruit juice, black coffee, and plain tea. Eating and drinking restrictions  Follow instructions from your health care provider about eating and drinking, which may include:  8 hours before the procedure - stop eating heavy meals or foods such as meat, fried foods, or fatty foods.  6 hours before the procedure - stop eating light meals or foods, such as toast or cereal.  6 hours before the procedure - stop drinking milk or drinks that contain milk.    2 hours before the procedure - stop drinking clear liquids. Medicine  You may be given medicines to lower anxiety.  Ask your health care provider about:  Changing or stopping your regular medicines. This is especially important if you are taking diabetes medicines or blood thinners.  Taking medicines such as aspirin and ibuprofen. These medicines can thin your blood. Do not take these medicines before your procedure if your health care provider instructs  you not to. General instructions  Plan to have someone take you home from the hospital or clinic. What happens during the procedure?  You may receive a medicine to help you relax (sedative).  You will be asked to lie on your abdomen.  The injection site will be cleaned.  A numbing medicine (local anesthetic) will be used to numb the injection site.  A needle will be inserted through your skin into the epidural space. You may feel some discomfort when this happens. An X-ray machine will be used to make sure the needle is put as close as possible to the affected nerve.  A steroid medicine and a local anesthetic will be injected into the epidural space.  The needle will be removed.  A bandage (dressing) will be put over the injection site. What happens after the procedure?  Your blood pressure, heart rate, breathing rate, and blood oxygen level will be monitored until the medicines you were given have worn off.  Your arm or leg may feel weak or numb for a few hours.  The injection site may feel sore.  Do not drive for 24 hours if you received a sedative. This information is not intended to replace advice given to you by your health care provider. Make sure you discuss any questions you have with your health care provider. Document Released: 03/25/2008 Document Revised: 05/30/2016 Document Reviewed: 04/03/2016 [employer] Interactive Patient Education  2017 [employer]  

## 2017-01-09 NOTE — Progress Notes (Signed)
Patient's Name: Teresa [name]  MRN: [id number]  Referring Provider: Lesli Albee, MD  DOB: Mar 07, 1961  PCP: Lesli Albee, MD  DOS: 01/09/2017  Note by: Sydnee Levans. Laban Emperor, MD  Service setting: Ambulatory outpatient  Location: [facility] (AMB) Pain Management Facility  Visit type: Procedure  Specialty: Interventional Pain Management  Patient type: Established   Primary Reason for Visit: Interventional Pain Management Treatment. CC: Back Pain (upper mid lower) and Neck Pain (Up back of head down left arm)  Procedure:  Anesthesia, Analgesia, Anxiolysis:  Type: Diagnostic, Inter-Laminar, Epidural Steroid Injection Region: Posterior Cervico-thoracic Region Level: C7-T1 Laterality: Left-Sided Paramedial  Type: Local Anesthesia with Moderate (Conscious) Sedation Local Anesthetic: Lidocaine 1% Route: Intravenous (IV) IV Access: Secured Sedation: Meaningful verbal contact was maintained at all times during the procedure  Indication(s): Analgesia and Anxiety  Indications: 1. Cervical spondylosis (WC)   2. Cervical radicular pain (WC) (Left)   3. Neurogenic pain   4. Muscle spasms of both lower extremities   5. Fibromyalgia    Pain Score: Pre-procedure: 5 /10 Post-procedure: 3 /10  Pre-Procedure Assessment:  Teresa [name] is a 56 y.o. (year old), female patient, seen today for interventional treatment. She  has a past surgical history that includes Neck surgery and btl.. Her primarily concern today is the Back Pain (upper mid lower) and Neck Pain (Up back of head down left arm) The primary encounter diagnosis was Cervical spondylosis (WC). Diagnoses of Cervical radicular pain (WC) (Left), Neurogenic pain, Muscle spasms of both lower extremities, and Fibromyalgia were also pertinent to this visit.  Pain Type: Chronic pain Pain Location: Back Pain Orientation: Mid, Lower, Upper Pain Descriptors / Indicators: Aching, Constant, Dull, Discomfort, Grimacing Pain Frequency: Constant  Date of Last  Visit: 09/19/16 Service Provided on Last Visit: Med Refill  Coagulation Parameters No results found for: INR, LABPROT, APTT, PLT Verification of the correct person, correct site (including marking of site), and correct procedure were performed and confirmed by the patient.  Consent: Before the procedure and under the influence of no sedative(s), amnesic(s), or anxiolytics, the patient was informed of the treatment options, risks and possible complications. To fulfill our ethical and legal obligations, as recommended by the American Medical Association's Code of Ethics, I have informed the patient of my clinical impression; the nature and purpose of the treatment or procedure; the risks, benefits, and possible complications of the intervention; the alternatives, including doing nothing; the risk(s) and benefit(s) of the alternative treatment(s) or procedure(s); and the risk(s) and benefit(s) of doing nothing. The patient was provided information about the general risks and possible complications associated with the procedure. These may include, but are not limited to: failure to achieve desired goals, infection, bleeding, organ or nerve damage, allergic reactions, paralysis, and death. In addition, the patient was informed of those risks and complications associated to Spine-related procedures, such as failure to decrease pain; infection (i.e.: Meningitis, epidural or intraspinal abscess); bleeding (i.e.: epidural hematoma, subarachnoid hemorrhage, or any other type of intraspinal or peri-dural bleeding); organ or nerve damage (i.e.: Any type of peripheral nerve, nerve root, or spinal cord injury) with subsequent damage to sensory, motor, and/or autonomic systems, resulting in permanent pain, numbness, and/or weakness of one or several areas of the body; allergic reactions; (i.e.: anaphylactic reaction); and/or death. Furthermore, the patient was informed of those risks and complications associated with  the medications. These include, but are not limited to: allergic reactions (i.e.: anaphylactic or anaphylactoid reaction(s)); adrenal axis suppression; blood sugar elevation that in diabetics  may result in ketoacidosis or comma; water retention that in patients with history of congestive heart failure may result in shortness of breath, pulmonary edema, and decompensation with resultant heart failure; weight gain; swelling or edema; medication-induced neural toxicity; particulate matter embolism and blood vessel occlusion with resultant organ, and/or nervous system infarction; and/or aseptic necrosis of one or more joints. Finally, the patient was informed that Medicine is not an exact science; therefore, there is also the possibility of unforeseen or unpredictable risks and/or possible complications that may result in a catastrophic outcome. The patient indicated having understood very clearly. We have given the patient no guarantees and we have made no promises. Enough time was given to the patient to ask questions, all of which were answered to the patient's satisfaction. Teresa [name] has indicated that she wanted to continue with the procedure.  Consent Attestation: I, the ordering provider, attest that I have discussed with the patient the benefits, risks, side-effects, alternatives, likelihood of achieving goals, and potential problems during recovery for the procedure that I have provided informed consent.  Pre-Procedure Preparation:  Safety Precautions: Allergies reviewed. The patient was asked about blood thinners, or active infections, both of which were denied. The patient was asked to confirm the procedure and laterality, before marking the site, and again before commencing the procedure. Appropriate site, procedure, and patient were confirmed by following the Joint Commission's Universal Protocol (UP.01.01.01), in the form of a "Time Out". The patient was asked to participate by confirming the  accuracy of the "Time Out" information. Patient was assessed for positional comfort and pressure points before starting the procedure. Allergies: She has no active allergies. Allergy Precautions: None required Infection Control Precautions: Sterile technique used. Standard Universal Precautions were taken as recommended by the Department of [facility] for Disease Control and Prevention (CDC). Standard pre-surgical skin prep was conducted. Respiratory hygiene and cough etiquette was practiced. Hand hygiene observed. Safe injection practices and needle disposal techniques followed. SDV (single dose vial) medications used. Medications properly checked for expiration dates and contaminants. Personal protective equipment (PPE) used as per protocol. Monitoring:  As per clinic protocol. Vitals:   01/09/17 1211 01/09/17 1214 01/09/17 1224 01/09/17 1235  BP: 125/65 125/65 (!) 148/76 139/69  Pulse: 75 65 (!) 59 60  Resp: (!) 24 18 16 18   Temp:  98.4 F (36.9 C)    TempSrc:      SpO2: 100% 100% 100% 99%  Weight:      Height:      Calculated BMI: Body mass index is 22.71 kg/m. Time-out: "Time-out" completed before starting procedure, as per protocol.  Imaging Review  Cervical Imaging: Cervical DG complete:  Results for orders placed in visit on 09/29/14  DG Cervical Spine Complete   Narrative * PRIOR REPORT IMPORTED FROM AN EXTERNAL SYSTEM *   CLINICAL DATA:  Injury 07/20/2010 resulting in the cervical spine  surgery. Now with regular headaches, neck pain and LEFT shoulder  pain, numbness in LEFT arm and LEFT hand, unable to grasp objects  for long periods of time, pain radiating to shoulder and up to eyes   EXAM:  CERVICAL SPINE  4+ VIEWS   COMPARISON:  MRI cervical spine 08/31/2010, radiographs 04/22/2013   FINDINGS:  Prevertebral soft tissues normal thickness.   Prior anterior fusion C4-C7 with anterior plate and screws.   Hardware intact.   Disc prostheses at C4-C5,  C5-C6 and C6-C7 disc spaces unchanged  versus prior radiographs.   Disc space narrowing with endplate  spur formation C3-C4.   No acute fracture, subluxation or bone destruction.   Foramina patent.   Lung apices clear.   IMPRESSION:  No acute abnormalities.   Prior C4-C7 anterior fusion.    Electronically Signed    By: Ulyses SouthwardMark  Boles M.D.    On: 09/29/2014 16:34       Description of Procedure Process:   Time-out: "Time-out" completed before starting procedure, as per protocol. Position: Prone with head of the table was raised to facilitate breathing. Target Area: For Epidural Steroid injections the target is the interlaminar space, initially targeting the lower border of the superior vertebral body lamina. Approach: Paramedial approach. Area Prepped: Entire PosteriorCervical Region Prepping solution: ChloraPrep (2% chlorhexidine gluconate and 70% isopropyl alcohol) Safety Precautions: Aspiration looking for blood return was conducted prior to all injections. At no point did we inject any substances, as a needle was being advanced. No attempts were made at seeking any paresthesias. Safe injection practices and needle disposal techniques used. Medications properly checked for expiration dates. SDV (single dose vial) medications used. Description of the Procedure: Protocol guidelines were followed. The procedure needle was introduced through the skin, ipsilateral to the reported pain, and advanced to the target area. Bone was contacted and the needle walked caudad, until the lamina was cleared. The epidural space was identified using "loss-of-resistance technique" with 2-3 ml of PF-NaCl (0.9% NSS), in a 5cc LOR glass syringe. EBL: None Materials & Medications:  Needle(s) Used: 20g - 10cm, Tuohy-style epidural needle Medication(s): see below.  Imaging Guidance (Spinal):  Type of Imaging Technique: Fluoroscopy Guidance (Spinal) Indication(s): Assistance in needle guidance and placement  for procedures requiring needle placement in or near specific anatomical locations not easily accessible without such assistance. Exposure Time: Please see nurses notes. Contrast: Before injecting any contrast, we confirmed that the patient did not have an allergy to iodine, shellfish, or radiological contrast. Once satisfactory needle placement was completed at the desired level, radiological contrast was injected. Contrast injected under live fluoroscopy. No contrast complications. See chart for type and volume of contrast used. Fluoroscopic Guidance: I was personally present during the use of fluoroscopy. "Tunnel Vision Technique" used to obtain the best possible view of the target area. Parallax error corrected before commencing the procedure. "Direction-depth-direction" technique used to introduce the needle under continuous pulsed fluoroscopy. Once target was reached, antero-posterior, oblique, and lateral fluoroscopic projection used confirm needle placement in all planes. Images permanently stored in EMR. Interpretation: I personally interpreted the imaging intraoperatively. Adequate needle placement confirmed in multiple planes. Appropriate spread of contrast into desired area was observed. No evidence of afferent or efferent intravascular uptake. No intrathecal or subarachnoid spread observed. Permanent images saved into the patient's record. Flow of contrast in the oblique view is observed to travel to cervical vertebral bodies downward as well as to cervical vertebral bodies off toward with no restriction of flow in the posterior aspect of the epidural space.  Antibiotic Prophylaxis:  Indication(s): No indications identified. Type:  Antibiotics Given (last 72 hours)    None      Post-operative Assessment:  Complications: No immediate post-treatment complications observed by team, or reported by patient. Disposition: The patient tolerated the entire procedure well. A repeat set of vitals  were taken after the procedure and the patient was kept under observation following institutional policy, for this type of procedure. Post-procedural neurological assessment was performed, showing return to baseline, prior to discharge. The patient was provided with post-procedure discharge instructions, including a section on how  to identify potential problems. Should any problems arise concerning this procedure, the patient was given instructions to immediately contact us, at any time, without hesitation. In any case, we plan to contact the patient by telephone for a follow-up status report regarding this interventional procedure. Comments:  No additional relevant information.  Plan of Care  Discharge to: Discharge home  Medications ordered for procedure: Meds ordered this encounter  Medications  . meloxicam (MOBIC) 15 MG tablet    Sig: Take 1 tablet (15 mg total) by mouth daily.    Dispense:  30 tablet    Refill:  5    Do not place this medication, or any other prescription from our practice, on "Automatic Refill". Patient may have prescription filled one day early if pharmacy is closed on scheduled refill date.  . cyclobenzaprine (FLEXERIL) 5 MG tablet    Sig: Take 1 tablet (5 mg total) by mouth 2 (two) times daily as needed for muscle spasms.    Dispense:  60 tablet    Refill:  5    Do not place this medication, or any other prescription from our practice, on "Automatic Refill". Patient may have prescription filled one day early if pharmacy is closed on scheduled refill date.  . gabapentin (NEURONTIN) 100 MG capsule    Sig: Take 1-3 capsules (100-300 mg total) by mouth at bedtime. Follow titration schedule.    Dispense:  90 capsule    Refill:  0    Do not place this medication, or any other prescription from our practice, on "Automatic Refill". Patient may have prescription filled one day early if pharmacy is closed on scheduled refill date.  Marland Kitchen dexamethasone (DECADRON) injection 10 mg    . iopamidol (ISOVUE-M) 41 % intrathecal injection 10 mL  . lidocaine (PF) (XYLOCAINE) 1 % injection 10 mL  . sodium chloride flush (NS) 0.9 % injection 1 mL  . ropivacaine (PF) 2 mg/mL (0.2%) (NAROPIN) injection 1 mL  . sodium chloride 0.9 % injection    Lowell Guitar, Patti: cabinet override  . ropivacaine (PF) 2 mg/mL (0.2%) (NAROPIN) 2 MG/ML injection    Lowell Guitar, Patti: cabinet override  . dexamethasone (DECADRON) 4 MG/ML injection    Lowell Guitar, Patti: cabinet override  . lidocaine (PF) (XYLOCAINE) 1 % injection    Lowell Guitar, Patti: cabinet override  . fentaNYL (SUBLIMAZE) 100 MCG/2ML injection    Lowell Guitar, Patti: cabinet override  . midazolam (VERSED) 5 MG/5ML injection    Lowell Guitar, Patti: cabinet override  . fentaNYL (SUBLIMAZE) injection 25-50 mcg    Make sure Narcan is available in the pyxis when using this medication. In the event of respiratory depression (RR< 8/min): Titrate NARCAN (naloxone) in increments of 0.1 to 0.2 mg IV at 2-3 minute intervals, until desired degree of reversal.  . lactated ringers infusion 1,000 mL  . midazolam (VERSED) 5 MG/5ML injection 1-2 mg    Make sure Flumazenil is available in the pyxis when using this medication. If oversedation occurs, administer 0.2 mg IV over 15 sec. If after 45 sec no response, administer 0.2 mg again over 1 min; may repeat at 1 min intervals; not to exceed 4 doses (1 mg)   Medications administered: (For more details, see medical record) We administered dexamethasone, iopamidol, lidocaine (PF), sodium chloride, ropivacaine (PF) 2 mg/mL (0.2%), dexamethasone, fentaNYL, midazolam, fentaNYL, lactated ringers, and midazolam. Lab-work, Procedure(s), & Referral(s) Ordered: Orders Placed This Encounter  Procedures  . Cervical Epidural Injection  . DG C-Arm 1-60 Min-No Report  . Discharge instructions  .  Follow-up  . Informed Consent Details: Transcribe to consent form and obtain patient signature  . Provider attestation of informed consent for  procedure/surgical case  . Verify informed consent   Imaging Ordered: No results found for this or any previous visit. New Prescriptions   No medications on file   Physician-requested Follow-up:  Return in about 2 weeks (around 01/23/2017) for Post-Procedure evaluation.  Future Appointments Date Time Provider Department Center  02/13/2017 1:30 PM Delano Metz, MD [facility] None  04/04/2017 10:30 AM Delano Metz, MD [facility] None   Primary Care Physician: Lesli Albee, MD Location: [facility] Note by: Donnisha Besecker A. Laban Emperor, M.D, DABA, DABAPM, DABPM, DABIPP, FIPP Date: 01/09/17; Time: 5:15 PM  Disclaimer:  Medicine is not an Visual merchandiser. The only guarantee in medicine is that nothing is guaranteed. It is important to note that the decision to proceed with this intervention was based on the information collected from the patient. The Data and conclusions were drawn from the patient's questionnaire, the interview, and the physical examination. Because the information was provided in large part by the patient, it cannot be guaranteed that it has not been purposely or unconsciously manipulated. Every effort has been made to obtain as much relevant data as possible for this evaluation. It is important to note that the conclusions that lead to this procedure are derived in large part from the available data. Always take into account that the treatment will also be dependent on availability of resources and existing treatment guidelines, considered by other Pain Management Practitioners as being common knowledge and practice, at the time of the intervention. For Medico-Legal purposes, it is also important to point out that variation in procedural techniques and pharmacological choices are the acceptable norm. The indications, contraindications, technique, and results of the above procedure should only be interpreted and judged by a Board-Certified Interventional  Pain Specialist with extensive familiarity and expertise in the same exact procedure and technique. Attempts at providing opinions without similar or greater experience and expertise than that of the treating physician will be considered as inappropriate and unethical, and shall result in a formal complaint to the state medical board and applicable specialty societies.  Instructions provided at this appointment: Patient Instructions  Pain Management Discharge Instructions  General Discharge Instructions :  If you need to reach your doctor call: Monday-Friday 8:00 am - 4:00 pm at [phone] or toll free [phone].  After clinic hours [phone] to have operator reach doctor.  Bring all of your medication bottles to all your appointments in the pain clinic.  To cancel or reschedule your appointment with Pain Management please remember to call 24 hours in advance to avoid a fee.  Refer to the educational materials which you have been given on: General Risks, I had my Procedure. Discharge Instructions, Post Sedation.  Post Procedure Instructions:  The drugs you were given will stay in your system until tomorrow, so for the next 24 hours you should not drive, make any legal decisions or drink any alcoholic beverages.  You may eat anything you prefer, but it is better to start with liquids then soups and crackers, and gradually work up to solid foods.  Please notify your doctor immediately if you have any unusual bleeding, trouble breathing or pain that is not related to your normal pain.  Depending on the type of procedure that was done, some parts of your body may feel week and/or numb.  This usually clears up by tonight or the next day.  Walk with the use of an assistive device or accompanied by an adult for the 24 hours.  You may use ice on the affected area for the first 24 hours.  Put ice in a Ziploc bag and cover with a towel and place against area 15 minutes on 15 minutes off.   You may switch to heat after 24 hours.Epidural Steroid Injection An epidural steroid injection is a shot of steroid medicine and numbing medicine that is given into the space between the spinal cord and the bones in your back (epidural space). The shot helps relieve pain caused by an irritated or swollen nerve root. The amount of pain relief you get from the injection depends on what is causing the nerve to be swollen and irritated, and how long your pain lasts. You are more likely to benefit from this injection if your pain is strong and comes on suddenly rather than if you have had pain for a long time. Tell a health care provider about:  Any allergies you have.  All medicines you are taking, including vitamins, herbs, eye drops, creams, and over-the-counter medicines.  Any problems you or family members have had with anesthetic medicines.  Any blood disorders you have.  Any surgeries you have had.  Any medical conditions you have.  Whether you are pregnant or may be pregnant. What are the risks? Generally, this is a safe procedure. However, problems may occur, including:  Headache.  Bleeding.  Infection.  Allergic reaction to medicines.  Damage to your nerves. What happens before the procedure? Staying hydrated  Follow instructions from your health care provider about hydration, which may include:  Up to 2 hours before the procedure - you may continue to drink clear liquids, such as water, clear fruit juice, black coffee, and plain tea. Eating and drinking restrictions  Follow instructions from your health care provider about eating and drinking, which may include:  8 hours before the procedure - stop eating heavy meals or foods such as meat, fried foods, or fatty foods.  6 hours before the procedure - stop eating light meals or foods, such as toast or cereal.  6 hours before the procedure - stop drinking milk or drinks that contain milk.  2 hours before the procedure -  stop drinking clear liquids. Medicine  You may be given medicines to lower anxiety.  Ask your health care provider about:  Changing or stopping your regular medicines. This is especially important if you are taking diabetes medicines or blood thinners.  Taking medicines such as aspirin and ibuprofen. These medicines can thin your blood. Do not take these medicines before your procedure if your health care provider instructs you not to. General instructions  Plan to have someone take you home from the hospital or clinic. What happens during the procedure?  You may receive a medicine to help you relax (sedative).  You will be asked to lie on your abdomen.  The injection site will be cleaned.  A numbing medicine (local anesthetic) will be used to numb the injection site.  A needle will be inserted through your skin into the epidural space. You may feel some discomfort when this happens. An X-ray machine will be used to make sure the needle is put as close as possible to the affected nerve.  A steroid medicine and a local anesthetic will be injected into the epidural space.  The needle will be removed.  A bandage (dressing) will be put over the injection site. What happens  after the procedure?  Your blood pressure, heart rate, breathing rate, and blood oxygen level will be monitored until the medicines you were given have worn off.  Your arm or leg may feel weak or numb for a few hours.  The injection site may feel sore.  Do not drive for 24 hours if you received a sedative. This information is not intended to replace advice given to you by your health care provider. Make sure you discuss any questions you have with your health care provider. Document Released: 03/25/2008 Document Revised: 05/30/2016 Document Reviewed: 04/03/2016 Elsevier Interactive Patient Education  2017 [employer].

## 2017-01-09 NOTE — Progress Notes (Signed)
Safety precautions to be maintained throughout the outpatient stay will include: orient to surroundings, keep bed in low position, maintain call bell within reach at all times, provide assistance with transfer out of bed and ambulation.  

## 2017-01-10 ENCOUNTER — Telehealth: Payer: Self-pay

## 2017-01-10 NOTE — Telephone Encounter (Signed)
Post procedure phone call.  Left message.  

## 2017-02-13 ENCOUNTER — Encounter (INDEPENDENT_AMBULATORY_CARE_PROVIDER_SITE_OTHER): Payer: Self-pay

## 2017-02-13 ENCOUNTER — Encounter: Payer: Self-pay | Admitting: Pain Medicine

## 2017-02-13 ENCOUNTER — Ambulatory Visit: Payer: Worker's Compensation | Attending: Pain Medicine | Admitting: Pain Medicine

## 2017-02-13 VITALS — BP 141/83 | HR 85 | Temp 98.3°F | Resp 14 | Ht 67.0 in | Wt 150.0 lb

## 2017-02-13 DIAGNOSIS — F329 Major depressive disorder, single episode, unspecified: Secondary | ICD-10-CM | POA: Insufficient documentation

## 2017-02-13 DIAGNOSIS — F1320 Sedative, hypnotic or anxiolytic dependence, uncomplicated: Secondary | ICD-10-CM | POA: Diagnosis not present

## 2017-02-13 DIAGNOSIS — G894 Chronic pain syndrome: Secondary | ICD-10-CM | POA: Diagnosis not present

## 2017-02-13 DIAGNOSIS — K219 Gastro-esophageal reflux disease without esophagitis: Secondary | ICD-10-CM | POA: Insufficient documentation

## 2017-02-13 DIAGNOSIS — M47812 Spondylosis without myelopathy or radiculopathy, cervical region: Secondary | ICD-10-CM | POA: Diagnosis not present

## 2017-02-13 DIAGNOSIS — Z79891 Long term (current) use of opiate analgesic: Secondary | ICD-10-CM | POA: Diagnosis not present

## 2017-02-13 DIAGNOSIS — M47892 Other spondylosis, cervical region: Secondary | ICD-10-CM | POA: Insufficient documentation

## 2017-02-13 DIAGNOSIS — Z79899 Other long term (current) drug therapy: Secondary | ICD-10-CM | POA: Diagnosis not present

## 2017-02-13 DIAGNOSIS — M542 Cervicalgia: Secondary | ICD-10-CM | POA: Diagnosis not present

## 2017-02-13 DIAGNOSIS — M5412 Radiculopathy, cervical region: Secondary | ICD-10-CM | POA: Diagnosis not present

## 2017-02-13 DIAGNOSIS — M25511 Pain in right shoulder: Secondary | ICD-10-CM | POA: Insufficient documentation

## 2017-02-13 DIAGNOSIS — M25562 Pain in left knee: Secondary | ICD-10-CM | POA: Insufficient documentation

## 2017-02-13 DIAGNOSIS — F17200 Nicotine dependence, unspecified, uncomplicated: Secondary | ICD-10-CM | POA: Diagnosis not present

## 2017-02-13 DIAGNOSIS — F411 Generalized anxiety disorder: Secondary | ICD-10-CM | POA: Diagnosis not present

## 2017-02-13 DIAGNOSIS — G8929 Other chronic pain: Secondary | ICD-10-CM

## 2017-02-13 DIAGNOSIS — M25512 Pain in left shoulder: Secondary | ICD-10-CM | POA: Diagnosis not present

## 2017-02-13 DIAGNOSIS — R209 Unspecified disturbances of skin sensation: Secondary | ICD-10-CM | POA: Diagnosis not present

## 2017-02-13 DIAGNOSIS — M1288 Other specific arthropathies, not elsewhere classified, other specified site: Secondary | ICD-10-CM

## 2017-02-13 DIAGNOSIS — M25561 Pain in right knee: Secondary | ICD-10-CM | POA: Insufficient documentation

## 2017-02-13 NOTE — Patient Instructions (Signed)
____________________________________________________________________________________________  Gabapentin Titration  Medication used: Gabapentin (Generic Name) or Neurontin (Brand Name) 100 mg tablets/capsules  Reasons to stop increasing the dose:  Reason 1: You get good relief of symptoms, in which case there is no need to increase the daily dose any further.    Reason 2: You develop some side effects, such as sleeping all of the time, difficulty concentrating, or becoming disoriented, in which case you need to go down on the dose, to the prior level, where you were not experiencing any side effects. Stay on that dose longer, to allow more time for your body to get use it, before attempting to increase it again.   Steps to increase medication: Step 1: Start by taking 1 (one) tablet at bedtime x 7 (seven) days.  Step 2: Increase dose to 2 (two) tablets at bedtime. Stay on this dose x 7 (seven) days.  Step 3: Next increase it to 3 (three) tablets at bedtime. Stay on this dose x another 7 (seven) days.  Step 4: Next, add 1 (one) tablet at noon with lunch. Continue this dose x another 7 (seven) days.  Step 5: Add 1 (one) tablet in the afternoon with dinner. Stay on this dose x another 7 (seven) days.  Step 6: At this point you should be taking the medicine 4 (four) times a day. This daily regimen of taking the medicine 4 (four) times a day, will be maintained from now on. You should not take any doses any sooner than every 6 (six) hours.  Step 7: After 7 (seven) days of taking 3 (three) tablet at bedtime, 1 (one) tablet at noon, 1 (one) tablet in the afternoon, and 1 (one) tablet in the morning, begin taking 2 (two) tablets at noon with lunch. Stay on this dose x another 7 (seven) days.   Step 8: After 7 (seven) days of taking 3 (three) tablet at bedtime, 2 (two) tablets at noon, 1 (one) tablet in the afternoon, and 1 (one) tablet in the morning, begin taking 2 (two) tablets in the afternoon  with dinner. Stay on this dose x another 7 (seven) days.   Step 9: After 7 (seven) days of taking 3 (three) tablet at bedtime, 2 (two) tablets at noon, 2 (two) tablets in the afternoon, and 1 (one) tablet in the morning, begin taking 2 (two) tablets in the morning with breakfast. Stay on this dose x another 7 (seven) days. At this point you should be taking the medicine 4 (four) times a day, or about every 6 (six) hours. This daily regimen of taking the medicine 4 (four) times a day, will be maintained from now on. You should not take any doses any sooner than every 6 (six) hours.  Step 10: After 7 (seven) days of taking 3 (three) tablet at bedtime, 2 (two) tablets at noon, 2 (two) tablets in the afternoon, and 2 (two) tablets in the morning, begin taking 3 (three) tablets at noon with lunch. Stay on this dose x another 7 (seven) days.   Step 11: After 7 (seven) days of taking 3 (three) tablet at bedtime, 3 (three) tablets at noon, 2 (two) tablets in the afternoon, and 2 (two) tablets in the morning, begin taking 3 (three) tablets in the afternoon with dinner. Stay on this dose x another 7 (seven) days.   Step 12: After 7 (seven) days of taking 3 (three) tablet at bedtime, 3 (three) tablets at noon, 3 (three) tablets in the afternoon, and 2 (two)   tablet in the morning, begin taking 3 (three) tablets in the morning with breakfast. Stay on this dose x another 7 (seven) days. At this point you should be taking the medicine 4 (four) times a day, or about every 6 (six) hours. This daily regimen of taking the medicine 4 (four) times a day, will be maintained from now on.   Endpoint: Once you have reached the maximum dose you can tolerate without side-effects, contact your physician so as to evaluate the results of the regimen.   Questions: Feel free to contact us for any questions or problems at [phone] ____________________________________________________________________________________________  

## 2017-02-13 NOTE — Progress Notes (Signed)
Patient's Name: Teresa [name]  MRN: [id number]  Referring Provider: Lesli AlbeeLeighton, Stephen, MD  DOB: 07/05/1961  PCP: Lesli AlbeeStephen Leighton, MD  DOS: 02/13/2017  Note by: Sydnee LevansFrancisco A. Laban EmperorNaveira, MD  Service setting: Ambulatory outpatient  Specialty: Interventional Pain Management  Location: [facility] (AMB) Pain Management Facility    Patient type: Established   Primary Reason(s) for Visit: Encounter for post-procedure evaluation of chronic illness with mild to moderate exacerbation CC: Neck Pain and Shoulder Pain  HPI  Teresa [name] is a [age] y.o. year old, female patient, who comes today for a post-procedure evaluation. She has History of panic attacks; Encounter for therapeutic drug level monitoring; Long term current use of opiate analgesic; Benzodiazepine dependence (HCC); Uncomplicated opioid dependence (HCC); Opiate use; Chronic pain syndrome (WC); Chronic upper back pain (WC); Thoracic back pain (WC); Hand pain; H/O cervical spine surgery; Chronic knee pain (Bilateral); Chronic low back pain; Cervical spondylosis (WC); Generalized anxiety disorder; Cervical facet syndrome (WC) (Bilateral) (L>R); Diffuse arthralgia; Tobacco use disorder; Fibromyalgia;  History of anterior cervical discectomy and fusion (ACDF) (left side)  (WC); Cervical radicular pain (WC) (1ry) (Left); Long term prescription opiate use; Encounter for chronic pain management; 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 Her primarily concern today is the Neck Pain and Shoulder Pain  Pain Assessment: Self-Reported Pain Score: 5 /10 Clinically the patient looks  like a 1/10 Reported level is inconsistent with clinical observations. Information on the proper use of the pain scale provided to the patient today Pain Type: Chronic pain Pain Location: Neck Pain Orientation: Left Pain Descriptors / Indicators: Aching, Constant, Dull, Sharp, Discomfort, Grimacing Pain Frequency: Constant  Teresa [name] comes in today for post-procedure evaluation after the treatment done on 01/09/2017.  Further details on both, my assessment(s), as well as the proposed treatment plan, please see below.  Post-Procedure Assessment  01/09/2017 Procedure: Diagnostic left-sided cervical epidural steroid injection under fluoroscopic guidance and IV sedation Post-procedure pain score: 0/10 (100% relief) Influential Factors: BMI: 23.49 kg/m Intra-procedural challenges: None observed Assessment challenges: None detected         Post-procedural side-effects, adverse reactions, or complications: None reported Reported issues: None  Sedation: Sedation provided. When no sedatives are used, the analgesic levels obtained are directly associated to the effectiveness of the local anesthetics. However, when sedation is provided, the level of analgesia obtained during the initial 1 hour following the intervention, is believed to be the result of a combination of factors. These factors may include, but are not limited to: 1. The effectiveness of the local anesthetics used. 2. The effects of the analgesic(s) and/or anxiolytic(s) used. 3. The degree of discomfort experienced by the patient at the time of the procedure. 4. The patients ability and reliability in recalling and recording the events. 5. The presence and influence of possible secondary gains and/or psychosocial factors. Reported result: Relief experienced during the 1st hour after the procedure: 100 % (Ultra-Short Term Relief) Interpretative annotation: Analgesia during this period is likely to be Local Anesthetic and/or IV Sedative  (Analgesic/Anxiolitic) related.          Effects of local anesthetic: The analgesic effects attained during this period are directly associated  to the localized infiltration of local anesthetics and therefore cary significant diagnostic value as to the etiological location, or anatomical origin, of the pain. Expected duration of relief is directly dependent on the pharmacodynamics of the local anesthetic used. Long-acting (4-6 hours) anesthetics used.  Reported result: Relief during the next 4 to 6 hour after the procedure: 100 % (Short-Term Relief) Interpretative annotation: Complete relief would suggest area to be the source of the pain.          Long-term benefit: Defined as the period of time past the expected duration of local anesthetics. With the possible exception of prolonged sympathetic blockade from the local anesthetics, benefits during this period are typically attributed to, or associated with, other factors such as analgesic sensory neuropraxia, antiinflammatory effects, or beneficial biochemical changes provided by agents other than the local anesthetics Reported result: Extended relief following procedure: 0 % (Long-Term Relief) Interpretative annotation: No long-term benefit. This could suggest limited inflammatory component to the pain with possible mechanical aggravating factors.          Current benefits: Defined as persistent relief that continues at this point in time.   Reported results: Treated area: 0 %       Interpretative annotation: Recurrance of symptoms. This would suggest persistent aggravating factors  Interpretation: Results would suggest a successful diagnostic intervention.          Laboratory Chemistry  Inflammation Markers Lab Results  Component Value Date   ESRSEDRATE 4 09/29/2014   Renal Function Lab Results  Component Value Date   BUN 14 09/29/2014   CREATININE 0.95 09/29/2014   GFRAA >60 09/29/2014   GFRNONAA >60 09/29/2014   Hepatic  Function Lab Results  Component Value Date   AST 13 (L) 09/29/2014   ALT 21 09/29/2014   ALBUMIN 4.6 09/29/2014   Electrolytes Lab Results  Component Value Date   NA 137 09/29/2014   K 4.3 09/29/2014   CL 104 09/29/2014   CALCIUM 9.5 09/29/2014   MG 1.9 09/29/2014   Pain Modulating Vitamins No results found for: VD25OH, VD125OH2TOT, WU9811BJ4, NW2956OZ3, 25OHVITD1, 25OHVITD2, 25OHVITD3, VITAMINB12 Coagulation Parameters No results found for: INR, LABPROT, APTT, PLT Cardiovascular No results found for: BNP, HGB, HCT Note: Lab results reviewed.  Recent Diagnostic Imaging Review  Dg C-arm 1-60 Min-no Report  Result Date: 01/09/2017 There is no Radiologist interpretation  for this exam.  Note: Imaging results reviewed.          Meds  The patient has a current medication list which includes the following prescription(s): alprazolam, cyclobenzaprine, fluoxetine, gabapentin, ibuprofen, and meloxicam.  Current Outpatient Prescriptions on File Prior to Visit  Medication Sig  . alprazolam (XANAX) 2 MG tablet Take 2 mg by mouth 2 (two) times daily as needed.   . cyclobenzaprine (FLEXERIL) 5 MG tablet Take 1 tablet (5 mg total) by mouth 2 (two) times daily as needed for muscle spasms.  Marland Kitchen FLUoxetine (PROZAC) 20 MG tablet Take 20 mg by mouth daily.  Marland Kitchen gabapentin (NEURONTIN) 100 MG capsule Take 1-3 capsules (100-300 mg total) by mouth at bedtime. Follow titration schedule.  Marland Kitchen ibuprofen (ADVIL,MOTRIN) 200 MG tablet Take 200 mg by mouth every 6 (six) hours as needed.  . meloxicam (MOBIC) 15 MG tablet Take 1 tablet (15 mg total) by mouth daily.   No current facility-administered medications on file prior to visit.    ROS  Constitutional: Denies any fever or chills Gastrointestinal: No reported hemesis, hematochezia, vomiting, or acute GI distress Musculoskeletal: Denies any  acute onset joint swelling, redness, loss of ROM, or weakness Neurological: No reported episodes of acute onset  apraxia, aphasia, dysarthria, agnosia, amnesia, paralysis, loss of coordination, or loss of consciousness  Allergies  Ms. Minetti has no active allergies.  PFSH  Drug: Ms. Durio  reports that she does not use drugs. Alcohol:  reports that she does not drink alcohol. Tobacco:  reports that she has been smoking.  She has never used smokeless tobacco. Medical:  has a past medical history of  History of anterior cervical discectomy and fusion (ACDF) (left side). (10/18/2015); Bilateral hip pain (10/18/2015); Cervical spine pain (WC) (10/18/2015); Cervical spondylosis with myelopathy; Chronic cervical radiculopathy (WC) (10/18/2015); Chronic constipation; Chronic pain syndrome; Depression; Fibromyalgia; GERD (gastroesophageal reflux disease); Insomnia; Migraine; Neck pain; and Panic attack. Family: family history is not on file.  Past Surgical History:  Procedure Laterality Date  . btl    . NECK SURGERY     Constitutional Exam  General appearance: Well nourished, well developed, and well hydrated. In no apparent acute distress. She also presents with left eye signs of slow eyelid movement when compared to the right. She indicates this to have started after the accident. She is having abnormal skin sensations over the left side of the face and around the ear. She refers also having difficulty finding words and being irritated and depressed.  Vitals:   02/13/17 1344  BP: (!) 141/83  Pulse: 85  Resp: 14  Temp: 98.3 F (36.8 C)  TempSrc: Oral  SpO2: 100%  Weight: 150 lb (68 kg)  Height: 5\' 7"  (1.702 m)   BMI Assessment: Estimated body mass index is 23.49 kg/m as calculated from the following:   Height as of this encounter: 5\' 7"  (1.702 m).   Weight as of this encounter: 150 lb (68 kg).  BMI interpretation table: BMI level Category Range association with higher incidence of chronic pain  <18 kg/m2 Underweight   18.5-24.9 kg/m2 Ideal body weight   25-29.9 kg/m2 Overweight Increased  incidence by 20%  30-34.9 kg/m2 Obese (Class I) Increased incidence by 68%  35-39.9 kg/m2 Severe obesity (Class II) Increased incidence by 136%  >40 kg/m2 Extreme obesity (Class III) Increased incidence by 254%   BMI Readings from Last 4 Encounters:  02/13/17 23.49 kg/m  01/09/17 22.71 kg/m  01/07/17 21.93 kg/m  10/24/16 21.14 kg/m   Wt Readings from Last 4 Encounters:  02/13/17 150 lb (68 kg)  01/09/17 145 lb (65.8 kg)  01/07/17 140 lb (63.5 kg)  10/24/16 135 lb (61.2 kg)  Psych/Mental status: Alert, oriented x 3 (person, place, & time)       Eyes: PERLA Respiratory: No evidence of acute respiratory distress  Cervical Spine Exam  Inspection: Well healed scar from previous spine surgery detected Alignment: Symmetrical Functional ROM: Minimal ROM Stability: No instability detected Muscle strength & Tone: Functionally intact Sensory: Movement-associated pain Palpation: Complains of area being tender to palpation  Upper Extremity (UE) Exam    Side: Right upper extremity  Side: Left upper extremity  Inspection: No masses, redness, swelling, or asymmetry. No contractures  Inspection: No masses, redness, swelling, or asymmetry. No contractures  Functional ROM: Unrestricted ROM          Functional ROM: Unrestricted ROM          Muscle strength & Tone: Functionally intact  Muscle strength & Tone: Functionally intact  Sensory: Unimpaired  Sensory: Dermatomal pain pattern  Palpation: Euthermic  Palpation: Euthermic  Specialized Test(s): Deferred  Specialized Test(s): Deferred          Thoracic Spine Exam  Inspection: No masses, redness, or swelling Alignment: Symmetrical Functional ROM: Unrestricted ROM Stability: No instability detected Sensory: Unimpaired Muscle strength & Tone: Functionally intact Palpation: Non-contributory  Lumbar Spine Exam  Inspection: No masses, redness, or swelling Alignment: Symmetrical Functional ROM: Unrestricted ROM Stability: No  instability detected Muscle strength & Tone: Functionally intact Sensory: Unimpaired Palpation: Non-contributory Provocative Tests: Lumbar Hyperextension and rotation test: evaluation deferred today       Patrick's Maneuver: evaluation deferred today              Gait & Posture Assessment  Ambulation: Unassisted Gait: Relatively normal for age and body habitus Posture: WNL   Lower Extremity Exam    Side: Right lower extremity  Side: Left lower extremity  Inspection: No masses, redness, swelling, or asymmetry. No contractures  Inspection: No masses, redness, swelling, or asymmetry. No contractures  Functional ROM: Unrestricted ROM          Functional ROM: Unrestricted ROM          Muscle strength & Tone: Functionally intact  Muscle strength & Tone: Functionally intact  Sensory: Unimpaired  Sensory: Unimpaired  Palpation: No palpable anomalies  Palpation: No palpable anomalies   Assessment  Primary Diagnosis & Pertinent Problem List: The primary encounter diagnosis was Chronic neck pain (WC) (Location of Primary Source of Pain) (Bilateral) (L>R). Diagnoses of Cervical radicular pain (WC) (Left), Cervical facet syndrome (WC) (Bilateral) (L>R), Cervical spondylosis (WC), Chronic pain syndrome (WC), Chronic pain of both shoulders (2ry) (B) (L>R), and Disturbance of skin sensation were also pertinent to this visit.  Status Diagnosis  Controlled Controlled Controlled 1. Chronic neck pain (WC) (Location of Primary Source of Pain) (Bilateral) (L>R)   2. Cervical radicular pain (WC) (Left)   3. Cervical facet syndrome (WC) (Bilateral) (L>R)   4. Cervical spondylosis (WC)   5. Chronic pain syndrome (WC)   6. Chronic pain of both shoulders (2ry) (B) (L>R)   7. Disturbance of skin sensation      Plan of Care  Pharmacotherapy (Medications Ordered): No orders of the defined types were placed in this encounter.  New Prescriptions   No medications on file   Medications administered  today: Ms. Welsch had no medications administered during this visit. Lab-work, procedure(s), and/or referral(s): Orders Placed This Encounter  Procedures  . Cervical Epidural Injection  . CERVICAL FACET (MEDIAL BRANCH NERVE BLOCK)   . Cervical Epidural Injection  . MR CERVICAL SPINE W WO CONTRAST  . Comprehensive metabolic panel  . C-reactive protein  . Magnesium  . Sedimentation rate  . Vitamin B12  . 25-Hydroxyvitamin D Lcms D2+D3   Imaging and/or referral(s): MR CERVICAL SPINE W WO CONTRAST  Interventional therapies: Planned, scheduled, and/or pending:   Not at this time.   Considering:   Diagnostic Left Cervical Facet Block  Left CESI under fluoro and IV sedation.   Palliative PRN treatment(s):   Diagnostic Left Cervical Facet Block  Left CESI under fluoro and IV sedation.   Provider-requested follow-up: Return for (PRN) procedure.  Future Appointments Date Time Provider Department Center  04/04/2017 10:30 AM Delano Metz, MD [facility] None   Primary Care Physician: Lesli Albee, MD Location: [facility] Note by: Sweta Halseth A. Laban Emperor, M.D, DABA, DABAPM, DABPM, DABIPP, FIPP Date: 02/13/2017; Time: 9:16 AM  Pain Score Disclaimer: We use the NRS-11 scale. This is a self-reported, subjective measurement of pain severity with only modest  accuracy. It is used primarily to identify changes within a particular patient. It must be understood that outpatient pain scales are significantly less accurate that those used for research, where they can be applied under ideal controlled circumstances with minimal exposure to variables. In reality, the score is likely to be a combination of pain intensity and pain affect, where pain affect describes the degree of emotional arousal or changes in action readiness caused by the sensory experience of pain. Factors such as social and work situation, setting, emotional state, anxiety levels, expectation, and  prior pain experience may influence pain perception and show large inter-individual differences that may also be affected by time variables.  Patient instructions provided during this appointment: Patient Instructions   Gabapentin Titration  Medication used: Gabapentin (Generic Name) or Neurontin (Brand Name) 100 mg tablets/capsules  Reasons to stop increasing the dose:  Reason 1: You get good relief of symptoms, in which case there is no need to increase the daily dose any further.    Reason 2: You develop some side effects, such as sleeping all of the time, difficulty concentrating, or becoming disoriented, in which case you need to go down on the dose, to the prior level, where you were not experiencing any side effects. Stay on that dose longer, to allow more time for your body to get use it, before attempting to increase it again.   Steps to increase medication: Step 1: Start by taking 1 (one) tablet at bedtime x 7 (seven) days.  Step 2: Increase dose to 2 (two) tablets at bedtime. Stay on this dose x 7 (seven) days.  Step 3: Next increase it to 3 (three) tablets at bedtime. Stay on this dose x another 7 (seven) days.  Step 4: Next, add 1 (one) tablet at noon with lunch. Continue this dose x another 7 (seven) days.  Step 5: Add 1 (one) tablet in the afternoon with dinner. Stay on this dose x another 7 (seven) days.  Step 6: At this point you should be taking the medicine 4 (four) times a day. This daily regimen of taking the medicine 4 (four) times a day, will be maintained from now on. You should not take any doses any sooner than every 6 (six) hours.  Step 7: After 7 (seven) days of taking 3 (three) tablet at bedtime, 1 (one) tablet at noon, 1 (one) tablet in the afternoon, and 1 (one) tablet in the morning, begin taking 2 (two) tablets at noon with lunch. Stay on this dose x another 7 (seven) days.   Step 8: After 7 (seven) days of taking 3 (three) tablet at bedtime, 2 (two)  tablets at noon, 1 (one) tablet in the afternoon, and 1 (one) tablet in the morning, begin taking 2 (two) tablets in the afternoon with dinner. Stay on this dose x another 7 (seven) days.   Step 9: After 7 (seven) days of taking 3 (three) tablet at bedtime, 2 (two) tablets at noon, 2 (two) tablets in the afternoon, and 1 (one) tablet in the morning, begin taking 2 (two) tablets in the morning with breakfast. Stay on this dose x another 7 (seven) days. At this point you should be taking the medicine 4 (four) times a day, or about every 6 (six) hours. This daily regimen of taking the medicine 4 (four) times a day, will be maintained from now on. You should not take any doses any sooner than every 6 (six) hours.  Step 10: After 7 (seven) days  of taking 3 (three) tablet at bedtime, 2 (two) tablets at noon, 2 (two) tablets in the afternoon, and 2 (two) tablets in the morning, begin taking 3 (three) tablets at noon with lunch. Stay on this dose x another 7 (seven) days.   Step 11: After 7 (seven) days of taking 3 (three) tablet at bedtime, 3 (three) tablets at noon, 2 (two) tablets in the afternoon, and 2 (two) tablets in the morning, begin taking 3 (three) tablets in the afternoon with dinner. Stay on this dose x another 7 (seven) days.   Step 12: After 7 (seven) days of taking 3 (three) tablet at bedtime, 3 (three) tablets at noon, 3 (three) tablets in the afternoon, and 2 (two) tablet in the morning, begin taking 3 (three) tablets in the morning with breakfast. Stay on this dose x another 7 (seven) days. At this point you should be taking the medicine 4 (four) times a day, or about every 6 (six) hours. This daily regimen of taking the medicine 4 (four) times a day, will be maintained from now on.   Endpoint: Once you have reached the maximum dose you can tolerate without side-effects, contact your physician so as to evaluate the results of the regimen.   Questions: Feel free to contact us for any questions  or problems at [phone]

## 2017-02-13 NOTE — Progress Notes (Signed)
Safety precautions to be maintained throughout the outpatient stay will include: orient to surroundings, keep bed in low position, maintain call bell within reach at all times, provide assistance with transfer out of bed and ambulation.  

## 2017-04-04 ENCOUNTER — Ambulatory Visit: Payer: Worker's Compensation | Attending: Pain Medicine | Admitting: Pain Medicine

## 2017-04-04 ENCOUNTER — Encounter: Payer: Self-pay | Admitting: Pain Medicine

## 2017-04-04 VITALS — BP 158/90 | HR 94 | Temp 98.4°F | Resp 14 | Ht 67.0 in | Wt 140.0 lb

## 2017-04-04 DIAGNOSIS — M5412 Radiculopathy, cervical region: Secondary | ICD-10-CM | POA: Diagnosis not present

## 2017-04-04 DIAGNOSIS — Z9889 Other specified postprocedural states: Secondary | ICD-10-CM

## 2017-04-04 DIAGNOSIS — F32A Depression, unspecified: Secondary | ICD-10-CM

## 2017-04-04 DIAGNOSIS — F17210 Nicotine dependence, cigarettes, uncomplicated: Secondary | ICD-10-CM | POA: Insufficient documentation

## 2017-04-04 DIAGNOSIS — M961 Postlaminectomy syndrome, not elsewhere classified: Secondary | ICD-10-CM | POA: Diagnosis not present

## 2017-04-04 DIAGNOSIS — Z79899 Other long term (current) drug therapy: Secondary | ICD-10-CM | POA: Diagnosis not present

## 2017-04-04 DIAGNOSIS — M542 Cervicalgia: Secondary | ICD-10-CM | POA: Diagnosis not present

## 2017-04-04 DIAGNOSIS — M1288 Other specific arthropathies, not elsewhere classified, other specified site: Secondary | ICD-10-CM

## 2017-04-04 DIAGNOSIS — M25552 Pain in left hip: Secondary | ICD-10-CM | POA: Insufficient documentation

## 2017-04-04 DIAGNOSIS — F411 Generalized anxiety disorder: Secondary | ICD-10-CM | POA: Insufficient documentation

## 2017-04-04 DIAGNOSIS — M797 Fibromyalgia: Secondary | ICD-10-CM | POA: Insufficient documentation

## 2017-04-04 DIAGNOSIS — F1320 Sedative, hypnotic or anxiolytic dependence, uncomplicated: Secondary | ICD-10-CM | POA: Insufficient documentation

## 2017-04-04 DIAGNOSIS — M25511 Pain in right shoulder: Secondary | ICD-10-CM | POA: Diagnosis not present

## 2017-04-04 DIAGNOSIS — Z7982 Long term (current) use of aspirin: Secondary | ICD-10-CM | POA: Insufficient documentation

## 2017-04-04 DIAGNOSIS — R209 Unspecified disturbances of skin sensation: Secondary | ICD-10-CM

## 2017-04-04 DIAGNOSIS — Z79891 Long term (current) use of opiate analgesic: Secondary | ICD-10-CM

## 2017-04-04 DIAGNOSIS — M25551 Pain in right hip: Secondary | ICD-10-CM | POA: Diagnosis not present

## 2017-04-04 DIAGNOSIS — F329 Major depressive disorder, single episode, unspecified: Secondary | ICD-10-CM | POA: Insufficient documentation

## 2017-04-04 DIAGNOSIS — G894 Chronic pain syndrome: Secondary | ICD-10-CM | POA: Diagnosis not present

## 2017-04-04 DIAGNOSIS — M25512 Pain in left shoulder: Secondary | ICD-10-CM

## 2017-04-04 DIAGNOSIS — M47812 Spondylosis without myelopathy or radiculopathy, cervical region: Secondary | ICD-10-CM

## 2017-04-04 DIAGNOSIS — F1120 Opioid dependence, uncomplicated: Secondary | ICD-10-CM | POA: Insufficient documentation

## 2017-04-04 DIAGNOSIS — G8929 Other chronic pain: Secondary | ICD-10-CM

## 2017-04-04 NOTE — Progress Notes (Signed)
Safety precautions to be maintained throughout the outpatient stay will include: orient to surroundings, keep bed in low position, maintain call bell within reach at all times, provide assistance with transfer out of bed and ambulation.  

## 2017-04-04 NOTE — Patient Instructions (Addendum)
Preparing for Procedure with Sedation Instructions: . Oral Intake: Do not eat or drink anything for at least 8 hours prior to your procedure. . Transportation: Public transportation is not allowed. Bring an adult driver. The driver must be physically present in our waiting room before any procedure can be started. . Physical Assistance: Bring an adult physically capable of assisting you, in the event you need help. This adult should keep you company at home for at least 6 hours after the procedure. . Blood Pressure Medicine: Take your blood pressure medicine with a sip of water the morning of the procedure. . Blood thinners:  . Diabetics on insulin: Notify the staff so that you can be scheduled 1st case in the morning. If your diabetes requires high dose insulin, take only  of your normal insulin dose the morning of the procedure and notify the staff that you have done so. . Preventing infections: Shower with an antibacterial soap the morning of your procedure. . Build-up your immune system: Take 1000 mg of Vitamin C with every meal (3 times a day) the day prior to your procedure. . Antibiotics: Inform the staff if you have a condition or reason that requires you to take antibiotics before dental procedures. . Pregnancy: If you are pregnant, call and cancel the procedure. . Sickness: If you have a cold, fever, or any active infections, call and cancel the procedure. . Arrival: You must be in the facility at least 30 minutes prior to your scheduled procedure. . Children: Do not bring children with you. . Dress appropriately: Bring dark clothing that you would not mind if they get stained. . Valuables: Do not bring any jewelry or valuables. Procedure appointments are reserved for interventional treatments only. . No Prescription Refills. . No medication changes will be discussed during procedure appointments. . No disability issues will be  discussed.  ____________________________________________________________________________________________   Facet Joint Block The facet joints connect the bones of the spine (vertebrae). They make it possible for you to bend, twist, and make other movements with your spine. They also keep you from bending too far, twisting too far, and making other excessive movements. A facet joint block is a procedure where a numbing medicine (anesthetic) is injected into a facet joint. Often, a type of anti-inflammatory medicine called a steroid is also injected. A facet joint block may be done to diagnose neck or back pain. If the pain gets better after a facet joint block, it means the pain is probably coming from the facet joint. If the pain does not get better, it means the pain is probably not coming from the facet joint. A facet joint block may also be done to relieve neck or back pain caused by an inflamed facet joint. A facet joint block is only done to relieve pain if the pain does not improve with other methods, such as medicine, exercise programs, and physical therapy. Tell a health care provider about:  Any allergies you have.  All medicines you are taking, including vitamins, herbs, eye drops, creams, and over-the-counter medicines.  Any problems you or family members have had with anesthetic medicines.  Any blood disorders you have.  Any surgeries you have had.  Any medical conditions you have.  Whether you are pregnant or may be pregnant. What are the risks? Generally, this is a safe procedure. However, problems may occur, including:  Bleeding.  Injury to a nerve near the injection site.  Pain at the injection site.  Weakness or numbness in   areas controlled by nerves near the injection site.  Infection.  Temporary fluid retention.  Allergic reactions to medicines or dyes.  Injury to other structures or organs near the injection site. What happens before the  procedure?  Follow instructions from your health care provider about eating or drinking restrictions.  Ask your health care provider about:  Changing or stopping your regular medicines. This is especially important if you are taking diabetes medicines or blood thinners.  Taking medicines such as aspirin and ibuprofen. These medicines can thin your blood. Do not take these medicines before your procedure if your health care provider instructs you not to.  Do not take any new dietary supplements or medicines without asking your health care provider first.  Plan to have someone take you home after the procedure. What happens during the procedure?  You may need to remove your clothing and dress in an open-back gown.  The procedure will be done while you are lying on an X-ray table. You will most likely be asked to lie on your stomach, but you may be asked to lie in a different position if an injection will be made in your neck.  Machines will be used to monitor your oxygen levels, heart rate, and blood pressure.  If an injection will be made in your neck, an IV tube will be inserted into one of your veins. Fluids and medicine will flow directly into your body through the IV tube.  The area over the facet joint where the injection will be made will be cleaned with soap. The surrounding skin will be covered with clean drapes.  A numbing medicine (local anesthetic) will be applied to your skin. Your skin may sting or burn for a moment.  A video X-ray machine (fluoroscopy) will be used to locate the joint. In some cases, a CT scan may be used.  A contrast dye may be injected into the facet joint area to help locate the joint.  When the joint is located, an anesthetic will be injected into the joint through the needle.  Your health care provider will ask you whether you feel pain relief. If you do feel relief, a steroid may be injected to provide pain relief for a longer period of time. If  you do not feel relief or feel only partial relief, additional injections of an anesthetic may be made in other facet joints.  The needle will be removed.  Your skin will be cleaned.  A bandage (dressing) will be applied over each injection site. The procedure may vary among health care providers and hospitals. What happens after the procedure?  You will be observed for 15-30 minutes before being allowed to go home. This information is not intended to replace advice given to you by your health care provider. Make sure you discuss any questions you have with your health care provider. Document Released: 05/08/2007 Document Revised: 01/18/2016 Document Reviewed: 09/12/2015 Else[employer] Interactive Patient Education  2017 [employer] GENERAL RISKS AND COMPLICATIONS  What are the risk, side effects and possible complications? Generally speaking, most procedures are safe.  However, with any procedure there are risks, side effects, and the possibility of complications.  The risks and complications are dependent upon the sites that are lesioned, or the type of nerve block to be performed.  The closer the procedure is to the spine, the more serious the risks are.  Great care is taken when placing the radio frequency needles, block needles or lesioning probes, but sometimes complications   can occur. 1. Infection: Any time there is an injection through the skin, there is a risk of infection.  This is why sterile conditions are used for these blocks.  There are four possible types of infection. 1. Localized skin infection. 2. Central Nervous System Infection-This can be in the form of Meningitis, which can be deadly. 3. Epidural Infections-This can be in the form of an epidural abscess, which can cause pressure inside of the spine, causing compression of the spinal cord with subsequent paralysis. This would require an emergency surgery to decompress, and there are no guarantees that the patient would recover  from the paralysis. 4. Discitis-This is an infection of the intervertebral discs.  It occurs in about 1% of discography procedures.  It is difficult to treat and it may lead to surgery.        2. Pain: the needles have to go through skin and soft tissues, will cause soreness.       3. Damage to internal structures:  The nerves to be lesioned may be near blood vessels or    other nerves which can be potentially damaged.       4. Bleeding: Bleeding is more common if the patient is taking blood thinners such as  aspirin, Coumadin, Ticiid, Plavix, etc., or if he/she have some genetic predisposition  such as hemophilia. Bleeding into the spinal canal can cause compression of the spinal  cord with subsequent paralysis.  This would require an emergency surgery to  decompress and there are no guarantees that the patient would recover from the  paralysis.       5. Pneumothorax:  Puncturing of a lung is a possibility, every time a needle is introduced in  the area of the chest or upper back.  Pneumothorax refers to free air around the  collapsed lung(s), inside of the thoracic cavity (chest cavity).  Another two possible  complications related to a similar event would include: Hemothorax and Chylothorax.   These are variations of the Pneumothorax, where instead of air around the collapsed  lung(s), you may have blood or chyle, respectively.       6. Spinal headaches: They may occur with any procedures in the area of the spine.       7. Persistent CSF (Cerebro-Spinal Fluid) leakage: This is a rare problem, but may occur  with prolonged intrathecal or epidural catheters either due to the formation of a fistulous  track or a dural tear.       8. Nerve damage: By working so close to the spinal cord, there is always a possibility of  nerve damage, which could be as serious as a permanent spinal cord injury with  paralysis.       9. Death:  Although rare, severe deadly allergic reactions known as "Anaphylactic  reaction"  can occur to any of the medications used.      10. Worsening of the symptoms:  We can always make thing worse.  What are the chances of something like this happening? Chances of any of this occuring are extremely low.  By statistics, you have more of a chance of getting killed in a motor vehicle accident: while driving to the hospital than any of the above occurring .  Nevertheless, you should be aware that they are possibilities.  In general, it is similar to taking a shower.  Everybody knows that you can slip, hit your head and get killed.  Does that mean that you should not shower   again?  Nevertheless always keep in mind that statistics do not mean anything if you happen to be on the wrong side of them.  Even if a procedure has a 1 (one) in a 1,000,000 (million) chance of going wrong, it you happen to be that one..Also, keep in mind that by statistics, you have more of a chance of having something go wrong when taking medications.  Who should not have this procedure? If you are on a blood thinning medication (e.g. Coumadin, Plavix, see list of "Blood Thinners"), or if you have an active infection going on, you should not have the procedure.  If you are taking any blood thinners, please inform your physician.  How should I prepare for this procedure?  Do not eat or drink anything at least six hours prior to the procedure.  Bring a driver with you .  It cannot be a taxi.  Come accompanied by an adult that can drive you back, and that is strong enough to help you if your legs get weak or numb from the local anesthetic.  Take all of your medicines the morning of the procedure with just enough water to swallow them.  If you have diabetes, make sure that you are scheduled to have your procedure done first thing in the morning, whenever possible.  If you have diabetes, take only half of your insulin dose and notify our nurse that you have done so as soon as you arrive at the clinic.  If you are  diabetic, but only take blood sugar pills (oral hypoglycemic), then do not take them on the morning of your procedure.  You may take them after you have had the procedure.  Do not take aspirin or any aspirin-containing medications, at least eleven (11) days prior to the procedure.  They may prolong bleeding.  Wear loose fitting clothing that may be easy to take off and that you would not mind if it got stained with Betadine or blood.  Do not wear any jewelry or perfume  Remove any nail coloring.  It will interfere with some of our monitoring equipment.  NOTE: Remember that this is not meant to be interpreted as a complete list of all possible complications.  Unforeseen problems may occur.  BLOOD THINNERS The following drugs contain aspirin or other products, which can cause increased bleeding during surgery and should not be taken for 2 weeks prior to and 1 week after surgery.  If you should need take something for relief of minor pain, you may take acetaminophen which is found in Tylenol,m Datril, Anacin-3 and Panadol. It is not blood thinner. The products listed below are.  Do not take any of the products listed below in addition to any listed on your instruction sheet.  A.P.C or A.P.C with Codeine Codeine Phosphate Capsules #3 Ibuprofen Ridaura  ABC compound Congesprin Imuran rimadil  Advil Cope Indocin Robaxisal  Alka-Seltzer Effervescent Pain Reliever and Antacid Coricidin or Coricidin-D  Indomethacin Rufen  Alka-Seltzer plus Cold Medicine Cosprin Ketoprofen S-A-C Tablets  Anacin Analgesic Tablets or Capsules Coumadin Korlgesic Salflex  Anacin Extra Strength Analgesic tablets or capsules CP-2 Tablets Lanoril Salicylate  Anaprox Cuprimine Capsules Levenox Salocol  Anexsia-D Dalteparin Magan Salsalate  Anodynos Darvon compound Magnesium Salicylate Sine-off  Ansaid Dasin Capsules Magsal Sodium Salicylate  Anturane Depen Capsules Marnal Soma  APF Arthritis pain formula Dewitt's Pills  Measurin Stanback  Argesic Dia-Gesic Meclofenamic Sulfinpyrazone  Arthritis Bayer Timed Release Aspirin Diclofenac Meclomen Sulindac  Arthritis pain formula Anacin Dicumarol  Medipren Supac  Analgesic (Safety coated) Arthralgen Diffunasal Mefanamic Suprofen  Arthritis Strength Bufferin Dihydrocodeine Mepro Compound Suprol  Arthropan liquid Dopirydamole Methcarbomol with Aspirin Synalgos  ASA tablets/Enseals Disalcid Micrainin Tagament  Ascriptin Doan's Midol Talwin  Ascriptin A/D Dolene Mobidin Tanderil  Ascriptin Extra Strength Dolobid Moblgesic Ticlid  Ascriptin with Codeine Doloprin or Doloprin with Codeine Momentum Tolectin  Asperbuf Duoprin Mono-gesic Trendar  Aspergum Duradyne Motrin or Motrin IB Triminicin  Aspirin plain, buffered or enteric coated Durasal Myochrisine Trigesic  Aspirin Suppositories Easprin Nalfon Trillsate  Aspirin with Codeine Ecotrin Regular or Extra Strength Naprosyn Uracel  Atromid-S Efficin Naproxen Ursinus  Auranofin Capsules Elmiron Neocylate Vanquish  Axotal Emagrin Norgesic Verin  Azathioprine Empirin or Empirin with Codeine Normiflo Vitamin E  Azolid Emprazil Nuprin Voltaren  Bayer Aspirin plain, buffered or children's or timed BC Tablets or powders Encaprin Orgaran Warfarin Sodium  Buff-a-Comp Enoxaparin Orudis Zorpin  Buff-a-Comp with Codeine Equegesic Os-Cal-Gesic   Buffaprin Excedrin plain, buffered or Extra Strength Oxalid   Bufferin Arthritis Strength Feldene Oxphenbutazone   Bufferin plain or Extra Strength Feldene Capsules Oxycodone with Aspirin   Bufferin with Codeine Fenoprofen Fenoprofen Pabalate or Pabalate-SF   Buffets II Flogesic Panagesic   Buffinol plain or Extra Strength Florinal or Florinal with Codeine Panwarfarin   Buf-Tabs Flurbiprofen Penicillamine   Butalbital Compound Four-way cold tablets Penicillin   Butazolidin Fragmin Pepto-Bismol   Carbenicillin Geminisyn Percodan   Carna Arthritis Reliever Geopen Persantine    Carprofen Gold's salt Persistin   Chloramphenicol Goody's Phenylbutazone   Chloromycetin Haltrain Piroxlcam   Clmetidine heparin Plaquenil   Cllnoril Hyco-pap Ponstel   Clofibrate Hydroxy chloroquine Propoxyphen         Before stopping any of these medications, be sure to consult the physician who ordered them.  Some, such as Coumadin (Warfarin) are ordered to prevent or treat serious conditions such as "deep thrombosis", "pumonary embolisms", and other heart problems.  The amount of time that you may need off of the medication may also vary with the medication and the reason for which you were taking it.  If you are taking any of these medications, please make sure you notify your pain physician before you undergo any procedures.          Facet Joint Block The facet joints connect the bones of the spine (vertebrae). They make it possible for you to bend, twist, and make other movements with your spine. They also keep you from bending too far, twisting too far, and making other excessive movements. A facet joint block is a procedure where a numbing medicine (anesthetic) is injected into a facet joint. Often, a type of anti-inflammatory medicine called a steroid is also injected. A facet joint block may be done to diagnose neck or back pain. If the pain gets better after a facet joint block, it means the pain is probably coming from the facet joint. If the pain does not get better, it means the pain is probably not coming from the facet joint. A facet joint block may also be done to relieve neck or back pain caused by an inflamed facet joint. A facet joint block is only done to relieve pain if the pain does not improve with other methods, such as medicine, exercise programs, and physical therapy. Tell a health care provider about:  Any allergies you have.  All medicines you are taking, including vitamins, herbs, eye drops, creams, and over-the-counter medicines.  Any problems you or  family members have had with anesthetic  medicines.  Any blood disorders you have.  Any surgeries you have had.  Any medical conditions you have.  Whether you are pregnant or may be pregnant. What are the risks? Generally, this is a safe procedure. However, problems may occur, including:  Bleeding.  Injury to a nerve near the injection site.  Pain at the injection site.  Weakness or numbness in areas controlled by nerves near the injection site.  Infection.  Temporary fluid retention.  Allergic reactions to medicines or dyes.  Injury to other structures or organs near the injection site. What happens before the procedure?  Follow instructions from your health care provider about eating or drinking restrictions.  Ask your health care provider about:  Changing or stopping your regular medicines. This is especially important if you are taking diabetes medicines or blood thinners.  Taking medicines such as aspirin and ibuprofen. These medicines can thin your blood. Do not take these medicines before your procedure if your health care provider instructs you not to.  Do not take any new dietary supplements or medicines without asking your health care provider first.  Plan to have someone take you home after the procedure. What happens during the procedure?  You may need to remove your clothing and dress in an open-back gown.  The procedure will be done while you are lying on an X-ray table. You will most likely be asked to lie on your stomach, but you may be asked to lie in a different position if an injection will be made in your neck.  Machines will be used to monitor your oxygen levels, heart rate, and blood pressure.  If an injection will be made in your neck, an IV tube will be inserted into one of your veins. Fluids and medicine will flow directly into your body through the IV tube.  The area over the facet joint where the injection will be made will be cleaned with  soap. The surrounding skin will be covered with clean drapes.  A numbing medicine (local anesthetic) will be applied to your skin. Your skin may sting or burn for a moment.  A video X-ray machine (fluoroscopy) will be used to locate the joint. In some cases, a CT scan may be used.  A contrast dye may be injected into the facet joint area to help locate the joint.  When the joint is located, an anesthetic will be injected into the joint through the needle.  Your health care provider will ask you whether you feel pain relief. If you do feel relief, a steroid may be injected to provide pain relief for a longer period of time. If you do not feel relief or feel only partial relief, additional injections of an anesthetic may be made in other facet joints.  The needle will be removed.  Your skin will be cleaned.  A bandage (dressing) will be applied over each injection site. The procedure may vary among health care providers and hospitals. What happens after the procedure?  You will be observed for 15-30 minutes before being allowed to go home. This information is not intended to replace advice given to you by your health care provider. Make sure you discuss any questions you have with your health care provider. Document Released: 05/08/2007 Document Revised: 01/18/2016 Document Reviewed: 09/12/2015 Elsevier Interactive Patient Education  2017 [employer].

## 2017-04-04 NOTE — Progress Notes (Signed)
Patient's Name: Teresa [name]  MRN: [id number]  Referring Provider: Suzanna Obey, MD  DOB: 07-12-1961  PCP: Suzanna Obey, MD  DOS: 04/04/2017  Note by: Kathlen Brunswick. Dossie Arbour, MD  Service setting: Ambulatory outpatient  Specialty: Interventional Pain Management  Location: [facility] (AMB) Pain Management Facility    Patient type: Established   Primary Reason(s) for Visit: Evaluation of chronic illnesses with exacerbation, or progression (Level of risk: moderate) CC: Neck Pain  HPI  Teresa [name] is a 56 y.o. year old, female patient, who comes today for a follow-up evaluation. She has History of panic attacks; Encounter for therapeutic drug level monitoring; Long term current use of opiate analgesic; Benzodiazepine dependence ([place]); Uncomplicated opioid dependence ([place]); Opiate use; Chronic pain syndrome (WC); Chronic upper back pain (WC); Thoracic back pain (WC); Hand pain; H/O cervical spine surgery; Chronic knee pain (Bilateral); Chronic low back pain; Cervical spondylosis (WC); Generalized anxiety disorder; Cervical facet syndrome (WC) (Bilateral) (L>R); Diffuse arthralgia; Tobacco use disorder; Fibromyalgia;  History of anterior cervical discectomy and fusion (ACDF) (left side)  (WC); Cervical radicular pain (WC) (1ry) (Left); Long term prescription opiate use; Encounter for chronic pain management; 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 Teresa [name] was last seen on 02/13/2017. Her primarily concern today is the Neck Pain  Pain Assessment: Self-Reported Pain Score: 8 /10 Clinically the patient  looks like a 4/10 Reported level is inconsistent with clinical observations. Information on the proper use of the pain scale provided to the patient today Pain Type: Chronic pain Pain Location: Neck Pain Descriptors / Indicators: Sharp Pain Frequency: Constant  Further details on both, my assessment(s), as well as the proposed treatment plan, please see below.  Laboratory Chemistry  Inflammation Markers Lab Results  Component Value Date   ESRSEDRATE 4 09/29/2014   (CRP: Acute Phase) (ESR: Chronic Phase) Renal Function Markers Lab Results  Component Value Date   BUN 14 09/29/2014   CREATININE 0.95 09/29/2014   GFRAA >60 09/29/2014   GFRNONAA >60 09/29/2014   Hepatic Function Markers Lab Results  Component Value Date   AST 13 (L) 09/29/2014   ALT 21 09/29/2014   ALBUMIN 4.6 09/29/2014   ALKPHOS 67 09/29/2014   Electrolytes Lab Results  Component Value Date   NA 137 09/29/2014   K 4.3 09/29/2014   CL 104 09/29/2014   CALCIUM 9.5 09/29/2014   MG 1.9 09/29/2014   Neuropathy Markers No results found for: [id number] Bone Pathology Markers Lab Results  Component Value Date   ALKPHOS 67 09/29/2014   CALCIUM 9.5 09/29/2014   Coagulation Parameters No results found for: INR, LABPROT, APTT, PLT Cardiovascular Markers No results found for: BNP, HGB, HCT Note: Lab results reviewed.  Recent Diagnostic Imaging Review  Dg C-arm 1-60 Min-no Report  Result Date: 01/09/2017 There is no Radiologist interpretation  for this exam.  Cervical Imaging: Cervical DG complete:  Results for orders placed in visit on 09/29/14  DG Cervical Spine Complete   Narrative * PRIOR REPORT IMPORTED FROM AN EXTERNAL SYSTEM *   CLINICAL DATA:  Injury 07/20/2010 resulting in the cervical spine  surgery. Now with regular headaches, neck pain and LEFT  shoulder  pain, numbness in LEFT arm and LEFT hand, unable to grasp objects  for long periods of time, pain radiating to shoulder and up to eyes    EXAM:  CERVICAL SPINE  4+ VIEWS   COMPARISON:  MRI cervical spine 08/31/2010, radiographs 04/22/2013   FINDINGS:  Prevertebral soft tissues normal thickness.   Prior anterior fusion C4-C7 with anterior plate and screws.   Hardware intact.   Disc prostheses at C4-C5, C5-C6 and C6-C7 disc spaces unchanged  versus prior radiographs.   Disc space narrowing with endplate spur formation C3-C4.   No acute fracture, subluxation or bone destruction.   Foramina patent.   Lung apices clear.   IMPRESSION:  No acute abnormalities.   Prior C4-C7 anterior fusion.    Electronically Signed    By: Lavonia Dana M.D.    On: 09/29/2014 16:34       Note: Results of ordered imaging test(s) reviewed and explained to patient in Layman's terms. Copy of results provided to patient  Meds  The patient has a current medication list which includes the following prescription(s): alprazolam, aspirin, cyclobenzaprine, gabapentin, ibuprofen, and meloxicam.  Current Outpatient Prescriptions on File Prior to Visit  Medication Sig  . alprazolam (XANAX) 2 MG tablet Take 2 mg by mouth 2 (two) times daily as needed.   . cyclobenzaprine (FLEXERIL) 5 MG tablet Take 1 tablet (5 mg total) by mouth 2 (two) times daily as needed for muscle spasms.  Marland Kitchen gabapentin (NEURONTIN) 100 MG capsule Take 1-3 capsules (100-300 mg total) by mouth at bedtime. Follow titration schedule.  Marland Kitchen ibuprofen (ADVIL,MOTRIN) 200 MG tablet Take 200 mg by mouth every 6 (six) hours as needed.  . meloxicam (MOBIC) 15 MG tablet Take 1 tablet (15 mg total) by mouth daily.   No current facility-administered medications on file prior to visit.    ROS  Constitutional: Denies any fever or chills Gastrointestinal: No reported hemesis, hematochezia, vomiting, or acute GI distress Musculoskeletal: Denies any acute onset joint swelling, redness, loss of ROM, or weakness Neurological: No reported episodes of acute onset apraxia, aphasia,  dysarthria, agnosia, amnesia, paralysis, loss of coordination, or loss of consciousness  Allergies  Teresa [name] has no active allergies.  [place]  Drug: Teresa [name]  reports that she does not use drugs. Alcohol:  reports that she does not drink alcohol. Tobacco:  reports that she has been smoking.  She has never used smokeless tobacco. Medical:  has a past medical history of  History of anterior cervical discectomy and fusion (ACDF) (left side). (10/18/2015); Bilateral hip pain (10/18/2015); Cervical spine pain (WC) (10/18/2015); Cervical spondylosis with myelopathy; Chronic cervical radiculopathy (WC) (10/18/2015); Chronic constipation; Chronic pain syndrome; Depression; Fibromyalgia; GERD (gastroesophageal reflux disease); Insomnia; Migraine; Neck pain; and Panic attack. Family: family history is not on file.  Past Surgical History:  Procedure Laterality Date  . btl    . NECK SURGERY     Constitutional Exam  General appearance: Well nourished, well developed, and well hydrated. In no apparent acute distress Vitals:   04/04/17 1037  BP: (!) 158/90  Pulse: 94  Resp: 14  Temp: 98.4 F (36.9 C)  TempSrc: Oral  SpO2: 98%  Weight: 140 lb (63.5 kg)  Height: _0  (1.702 m)   BMI Assessment: Estimated body mass index is 21.93 kg/m as calculated from the following:   Height as of this encounter: _1  (1.702 m).   Weight as of this encounter: 140 lb (63.5 kg).  BMI interpretation table:  BMI level Category Range association with higher incidence of chronic pain  <18 kg/m2 Underweight   18.5-24.9 kg/m2 Ideal body weight   25-29.9 kg/m2 Overweight Increased incidence by 20%  30-34.9 kg/m2 Obese (Class I) Increased incidence by 68%  35-39.9 kg/m2 Severe obesity (Class II) Increased incidence by 136%  >40 kg/m2 Extreme obesity (Class III) Increased incidence by 254%   BMI Readings from Last 4 Encounters:  04/04/17 21.93 kg/m  02/13/17 23.49 kg/m  01/09/17 22.71 kg/m  01/07/17 21.93  kg/m   Wt Readings from Last 4 Encounters:  04/04/17 140 lb (63.5 kg)  02/13/17 150 lb (68 kg)  01/09/17 145 lb (65.8 kg)  01/07/17 140 lb (63.5 kg)  Psych/Mental status: Alert, oriented x 3 (person, place, & time)       Eyes: PERLA Respiratory: No evidence of acute respiratory distress  Cervical Spine Exam  Inspection: No masses, redness, or swelling Alignment: Symmetrical Functional ROM: Decreased ROM Stability: No instability detected Muscle strength & Tone: Functionally intact Sensory: Movement-associated pain Palpation: Complains of area being tender to palpation  Upper Extremity (UE) Exam    Side: Right upper extremity  Side: Left upper extremity  Inspection: No masses, redness, swelling, or asymmetry. No contractures  Inspection: No masses, redness, swelling, or asymmetry. No contractures  Functional ROM: Unrestricted ROM          Functional ROM: Unrestricted ROM          Muscle strength & Tone: Functionally intact  Muscle strength & Tone: Functionally intact  Sensory: Unimpaired  Sensory: Unimpaired  Palpation: No palpable anomalies  Palpation: No palpable anomalies  Specialized Test(s): Deferred         Specialized Test(s): Deferred          Thoracic Spine Exam  Inspection: No masses, redness, or swelling Alignment: Symmetrical Functional ROM: Unrestricted ROM Stability: No instability detected Sensory: Unimpaired Muscle strength & Tone: No palpable anomalies  Lumbar Spine Exam  Inspection: No masses, redness, or swelling Alignment: Symmetrical Functional ROM: Unrestricted ROM Stability: No instability detected Muscle strength & Tone: Functionally intact Sensory: Unimpaired Palpation: No palpable anomalies Provocative Tests: Lumbar Hyperextension and rotation test: evaluation deferred today       Patrick's Maneuver: evaluation deferred today              Gait & Posture Assessment  Ambulation: Unassisted Gait: Relatively normal for age and body  habitus Posture: WNL   Lower Extremity Exam    Side: Right lower extremity  Side: Left lower extremity  Inspection: No masses, redness, swelling, or asymmetry. No contractures  Inspection: No masses, redness, swelling, or asymmetry. No contractures  Functional ROM: Unrestricted ROM          Functional ROM: Unrestricted ROM          Muscle strength & Tone: Functionally intact  Muscle strength & Tone: Functionally intact  Sensory: Unimpaired  Sensory: Unimpaired  Palpation: No palpable anomalies  Palpation: No palpable anomalies   Assessment  Primary Diagnosis & Pertinent Problem List: The primary encounter diagnosis was Chronic neck pain (WC) (2ry) (Bilateral) (L>R). 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  Status Diagnosis  Controlled Controlled Controlled 1. Chronic neck pain (WC) (2ry) (Bilateral) (L>R)   2. Chronic pain of both shoulders (2ry) (B) (L>R)   3. Cervical radicular pain (WC) (1ry) (Left)   4. Cervical spondylosis (WC)   5. 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 Cervical facet syndrome (WC) (Bilateral) (L>R)   7. Chronic neck pain (WC) (Location of Primary Source of Pain) (Bilateral) (L>R)   8. Cervical radicular pain (WC) (Left)   9. Generalized anxiety disorder   10. Depression,  unspecified depression type   11. Benzodiazepine dependence ([place])   12. Long term current use of opiate analgesic   13. Uncomplicated opioid dependence ([place])   14.  History of anterior cervical discectomy and fusion (ACDF) (left side)  (WC)   15. Chronic pain syndrome (WC)   16. Disturbance of skin sensation      Plan of Care  Pharmacotherapy (Medications Ordered): No orders of the defined types were placed in this encounter.  New Prescriptions   No medications on file   Medications administered today: Ms. Bernales had no medications administered during this visit. Lab-work, procedure(s), and/or referral(s): Orders Placed This Encounter  Procedures  . CERVICAL FACET (MEDIAL BRANCH NERVE BLOCK)   . Ambulatory referral to Psychology  . Ambulatory referral to Psychiatry   Imaging and/or referral(s): MR CERVICAL SPINE W WO CONTRAST AMB REFERRAL TO PSYCHOLOGY AMB REFERRAL TO PSYCHIATRY  Interventional therapies: Planned, scheduled, and/or pending:   Not at this time.   Considering:   Diagnostic Left Cervical Facet Block  Left CESI under fluoro and IV sedation.   Palliative PRN treatment(s):   Diagnostic Left Cervical Facet Block  Left CESI under fluoro and IV sedation.   Provider-requested follow-up: Return for procedure (ASAP).  No future appointments. Primary Care Physician: Suzanna Obey, MD Location: [facility] Outpatient Pain Management Facility Note by: Tyrisha Benninger A. Dossie Arbour, M.D, DABA, DABAPM, DABPM, DABIPP, FIPP Date: 04/04/2017; Time: 12:24 PM  Pain Score Disclaimer: We use the NRS-11 scale. This is a self-reported, subjective measurement of pain severity with only modest accuracy. It is used primarily to identify changes within a particular patient. It must be understood that outpatient pain scales are significantly less accurate that those used for research, where they can be applied under ideal controlled circumstances with minimal exposure to variables. In reality,  the score is likely to be a combination of pain intensity and pain affect, where pain affect describes the degree of emotional arousal or changes in action readiness caused by the sensory experience of pain. Factors such as social and work situation, setting, emotional state, anxiety levels, expectation, and prior pain experience may influence pain perception and show large inter-individual differences that may also be affected by time variables.  Patient instructions provided during this appointment: Patient Instructions   Preparing for Procedure with Sedation Instructions: . Oral Intake: Do not eat or drink anything for at least 8 hours prior to your procedure. . Transportation: Public transportation is not allowed. Bring an adult driver. The driver must be physically present in our waiting room before any procedure can be started. Marland Kitchen Physical Assistance: Bring an adult physically capable of assisting you, in the event you need help. This adult should keep you company at home for at least 6 hours after the procedure. Marland Kitchen  Blood Pressure Medicine: Take your blood pressure medicine with a sip of water the morning of the procedure. . Blood thinners:  . Diabetics on insulin: Notify the staff so that you can be scheduled 1st case in the morning. If your diabetes requires high dose insulin, take only  of your normal insulin dose the morning of the procedure and notify the staff that you have done so. . Preventing infections: Shower with an antibacterial soap the morning of your procedure. . Build-up your immune system: Take 1000 mg of Vitamin C with every meal (3 times a day) the day prior to your procedure. Marland Kitchen Antibiotics: Inform the staff if you have a condition or reason that requires you to take antibiotics before dental procedures. . Pregnancy: If you are pregnant, call and cancel the procedure. . Sickness: If you have a cold, fever, or any active infections, call and cancel the procedure. . Arrival:  You must be in the facility at least 30 minutes prior to your scheduled procedure. . Children: Do not bring children with you. . Dress appropriately: Bring dark clothing that you would not mind if they get stained. . Valuables: Do not bring any jewelry or valuables. Procedure appointments are reserved for interventional treatments only. Marland Kitchen No Prescription Refills. . No medication changes will be discussed during procedure appointments. . No disability issues will be discussed.  ____________________________________________________________________________________________   Facet Joint Block The facet joints connect the bones of the spine (vertebrae). They make it possible for you to bend, twist, and make other movements with your spine. They also keep you from bending too far, twisting too far, and making other excessive movements. A facet joint block is a procedure where a numbing medicine (anesthetic) is injected into a facet joint. Often, a type of anti-inflammatory medicine called a steroid is also injected. A facet joint block may be done to diagnose neck or back pain. If the pain gets better after a facet joint block, it means the pain is probably coming from the facet joint. If the pain does not get better, it means the pain is probably not coming from the facet joint. A facet joint block may also be done to relieve neck or back pain caused by an inflamed facet joint. A facet joint block is only done to relieve pain if the pain does not improve with other methods, such as medicine, exercise programs, and physical therapy. Tell a health care provider about:  Any allergies you have.  All medicines you are taking, including vitamins, herbs, eye drops, creams, and over-the-counter medicines.  Any problems you or family members have had with anesthetic medicines.  Any blood disorders you have.  Any surgeries you have had.  Any medical conditions you have.  Whether you are pregnant or may  be pregnant. What are the risks? Generally, this is a safe procedure. However, problems may occur, including:  Bleeding.  Injury to a nerve near the injection site.  Pain at the injection site.  Weakness or numbness in areas controlled by nerves near the injection site.  Infection.  Temporary fluid retention.  Allergic reactions to medicines or dyes.  Injury to other structures or organs near the injection site. What happens before the procedure?  Follow instructions from your health care provider about eating or drinking restrictions.  Ask your health care provider about:  Changing or stopping your regular medicines. This is especially important if you are taking diabetes medicines or blood thinners.  Taking medicines such as aspirin and  ibuprofen. These medicines can thin your blood. Do not take these medicines before your procedure if your health care provider instructs you not to.  Do not take any new dietary supplements or medicines without asking your health care provider first.  Plan to have someone take you home after the procedure. What happens during the procedure?  You may need to remove your clothing and dress in an open-back gown.  The procedure will be done while you are lying on an X-ray table. You will most likely be asked to lie on your stomach, but you may be asked to lie in a different position if an injection will be made in your neck.  Machines will be used to monitor your oxygen levels, heart rate, and blood pressure.  If an injection will be made in your neck, an IV tube will be inserted into one of your veins. Fluids and medicine will flow directly into your body through the IV tube.  The area over the facet joint where the injection will be made will be cleaned with soap. The surrounding skin will be covered with clean drapes.  A numbing medicine (local anesthetic) will be applied to your skin. Your skin may sting or burn for a moment.  A video  X-ray machine (fluoroscopy) will be used to locate the joint. In some cases, a CT scan may be used.  A contrast dye may be injected into the facet joint area to help locate the joint.  When the joint is located, an anesthetic will be injected into the joint through the needle.  Your health care provider will ask you whether you feel pain relief. If you do feel relief, a steroid may be injected to provide pain relief for a longer period of time. If you do not feel relief or feel only partial relief, additional injections of an anesthetic may be made in other facet joints.  The needle will be removed.  Your skin will be cleaned.  A bandage (dressing) will be applied over each injection site. The procedure may vary among health care providers and hospitals. What happens after the procedure?  You will be observed for 15-30 minutes before being allowed to go home. This information is not intended to replace advice given to you by your health care provider. Make sure you discuss any questions you have with your health care provider. Document Released: 05/08/2007 Document Revised: 01/18/2016 Document Reviewed: 09/12/2015 Elsevier Interactive Patient Education  2017 [place]  What are the risk, side effects and possible complications? Generally speaking, most procedures are safe.  However, with any procedure there are risks, side effects, and the possibility of complications.  The risks and complications are dependent upon the sites that are lesioned, or the type of nerve block to be performed.  The closer the procedure is to the spine, the more serious the risks are.  Great care is taken when placing the radio frequency needles, block needles or lesioning probes, but sometimes complications can occur. 1. Infection: Any time there is an injection through the skin, there is a risk of infection.  This is why sterile conditions are used for these blocks.  There are  four possible types of infection. 1. Localized skin infection. 2. Central Nervous System Infection-This can be in the form of Meningitis, which can be deadly. 3. Epidural Infections-This can be in the form of an epidural abscess, which can cause pressure inside of the spine, causing compression of the spinal cord  with subsequent paralysis. This would require an emergency surgery to decompress, and there are no guarantees that the patient would recover from the paralysis. 4. Discitis-This is an infection of the intervertebral discs.  It occurs in about 1% of discography procedures.  It is difficult to treat and it may lead to surgery.        2. Pain: the needles have to go through skin and soft tissues, will cause soreness.       3. Damage to internal structures:  The nerves to be lesioned may be near blood vessels or    other nerves which can be potentially damaged.       4. Bleeding: Bleeding is more common if the patient is taking blood thinners such as  aspirin, Coumadin, Ticiid, Plavix, etc., or if he/she have some genetic predisposition  such as hemophilia. Bleeding into the spinal canal can cause compression of the spinal  cord with subsequent paralysis.  This would require an emergency surgery to  decompress and there are no guarantees that the patient would recover from the  paralysis.       5. Pneumothorax:  Puncturing of a lung is a possibility, every time a needle is introduced in  the area of the chest or upper back.  Pneumothorax refers to free air around the  collapsed lung(s), inside of the thoracic cavity (chest cavity).  Another two possible  complications related to a similar event would include: Hemothorax and Chylothorax.   These are variations of the Pneumothorax, where instead of air around the collapsed  lung(s), you may have blood or chyle, respectively.       6. Spinal headaches: They may occur with any procedures in the area of the spine.       7. Persistent CSF (Cerebro-Spinal  Fluid) leakage: This is a rare problem, but may occur  with prolonged intrathecal or epidural catheters either due to the formation of a fistulous  track or a dural tear.       8. Nerve damage: By working so close to the spinal cord, there is always a possibility of  nerve damage, which could be as serious as a permanent spinal cord injury with  paralysis.       9. Death:  Although rare, severe deadly allergic reactions known as "Anaphylactic  reaction" can occur to any of the medications used.      10. Worsening of the symptoms:  We can always make thing worse.  What are the chances of something like this happening? Chances of any of this occuring are extremely low.  By statistics, you have more of a chance of getting killed in a motor vehicle accident: while driving to the hospital than any of the above occurring .  Nevertheless, you should be aware that they are possibilities.  In general, it is similar to taking a shower.  Everybody knows that you can slip, hit your head and get killed.  Does that mean that you should not shower again?  Nevertheless always keep in mind that statistics do not mean anything if you happen to be on the wrong side of them.  Even if a procedure has a 1 (one) in a 1,000,000 (million) chance of going wrong, it you happen to be that one..Also, keep in mind that by statistics, you have more of a chance of having something go wrong when taking medications.  Who should not have this procedure? If you are on a blood thinning medication (e.g. Coumadin,  Plavix, see list of "Blood Thinners"), or if you have an active infection going on, you should not have the procedure.  If you are taking any blood thinners, please inform your physician.  How should I prepare for this procedure?  Do not eat or drink anything at least six hours prior to the procedure.  Bring a driver with you .  It cannot be a taxi.  Come accompanied by an adult that can drive you back, and that is strong  enough to help you if your legs get weak or numb from the local anesthetic.  Take all of your medicines the morning of the procedure with just enough water to swallow them.  If you have diabetes, make sure that you are scheduled to have your procedure done first thing in the morning, whenever possible.  If you have diabetes, take only half of your insulin dose and notify our nurse that you have done so as soon as you arrive at the clinic.  If you are diabetic, but only take blood sugar pills (oral hypoglycemic), then do not take them on the morning of your procedure.  You may take them after you have had the procedure.  Do not take aspirin or any aspirin-containing medications, at least eleven (11) days prior to the procedure.  They may prolong bleeding.  Wear loose fitting clothing that may be easy to take off and that you would not mind if it got stained with Betadine or blood.  Do not wear any jewelry or perfume  Remove any nail coloring.  It will interfere with some of our monitoring equipment.  NOTE: Remember that this is not meant to be interpreted as a complete list of all possible complications.  Unforeseen problems may occur.  BLOOD THINNERS The following drugs contain aspirin or other products, which can cause increased bleeding during surgery and should not be taken for 2 weeks prior to and 1 week after surgery.  If you should need take something for relief of minor pain, you may take acetaminophen which is found in Tylenol,m Datril, Anacin-3 and Panadol. It is not blood thinner. The products listed below are.  Do not take any of the products listed below in addition to any listed on your instruction sheet.  A.P.C or A.P.C with Codeine Codeine Phosphate Capsules #3 Ibuprofen Ridaura  ABC compound Congesprin Imuran rimadil  Advil Cope Indocin Robaxisal  Alka-Seltzer Effervescent Pain Reliever and Antacid Coricidin or Coricidin-D  Indomethacin Rufen  Alka-Seltzer plus Cold  Medicine Cosprin Ketoprofen S-A-C Tablets  Anacin Analgesic Tablets or Capsules Coumadin Korlgesic Salflex  Anacin Extra Strength Analgesic tablets or capsules CP-2 Tablets Lanoril Salicylate  Anaprox Cuprimine Capsules Levenox Salocol  Anexsia-D Dalteparin Magan Salsalate  Anodynos Darvon compound Magnesium Salicylate Sine-off  Ansaid Dasin Capsules Magsal Sodium Salicylate  Anturane Depen Capsules Marnal Soma  APF Arthritis pain formula Dewitt's Pills Measurin Stanback  Argesic Dia-Gesic Meclofenamic Sulfinpyrazone  Arthritis Bayer Timed Release Aspirin Diclofenac Meclomen Sulindac  Arthritis pain formula Anacin Dicumarol Medipren Supac  Analgesic (Safety coated) Arthralgen Diffunasal Mefanamic Suprofen  Arthritis Strength Bufferin Dihydrocodeine Mepro Compound Suprol  Arthropan liquid Dopirydamole Methcarbomol with Aspirin Synalgos  ASA tablets/Enseals Disalcid Micrainin Tagament  Ascriptin Doan's Midol Talwin  Ascriptin A/D Dolene Mobidin Tanderil  Ascriptin Extra Strength Dolobid Moblgesic Ticlid  Ascriptin with Codeine Doloprin or Doloprin with Codeine Momentum Tolectin  Asperbuf Duoprin Mono-gesic Trendar  Aspergum Duradyne Motrin or Motrin IB Triminicin  Aspirin plain, buffered or enteric coated Durasal Myochrisine Trigesic  Aspirin Suppositories Easprin Nalfon Trillsate  Aspirin with Codeine Ecotrin Regular or Extra Strength Naprosyn Uracel  Atromid-S Efficin Naproxen Ursinus  Auranofin Capsules Elmiron Neocylate Vanquish  Axotal Emagrin Norgesic Verin  Azathioprine Empirin or Empirin with Codeine Normiflo Vitamin E  Azolid Emprazil Nuprin Voltaren  Bayer Aspirin plain, buffered or children's or timed BC Tablets or powders Encaprin Orgaran Warfarin Sodium  Buff-a-Comp Enoxaparin Orudis Zorpin  Buff-a-Comp with Codeine Equegesic Os-Cal-Gesic   Buffaprin Excedrin plain, buffered or Extra Strength Oxalid   Bufferin Arthritis Strength Feldene Oxphenbutazone   Bufferin plain or  Extra Strength Feldene Capsules Oxycodone with Aspirin   Bufferin with Codeine Fenoprofen Fenoprofen Pabalate or Pabalate-SF   Buffets II Flogesic Panagesic   Buffinol plain or Extra Strength Florinal or Florinal with Codeine Panwarfarin   Buf-Tabs Flurbiprofen Penicillamine   Butalbital Compound Four-way cold tablets Penicillin   Butazolidin Fragmin Pepto-Bismol   Carbenicillin Geminisyn Percodan   Carna Arthritis Reliever Geopen Persantine   Carprofen Gold's salt Persistin   Chloramphenicol Goody's Phenylbutazone   Chloromycetin Haltrain Piroxlcam   Clmetidine heparin Plaquenil   Cllnoril Hyco-pap Ponstel   Clofibrate Hydroxy chloroquine Propoxyphen         Before stopping any of these medications, be sure to consult the physician who ordered them.  Some, such as Coumadin (Warfarin) are ordered to prevent or treat serious conditions such as "deep thrombosis", "pumonary embolisms", and other heart problems.  The amount of time that you may need off of the medication may also vary with the medication and the reason for which you were taking it.  If you are taking any of these medications, please make sure you notify your pain physician before you undergo any procedures.          Facet Joint Block The facet joints connect the bones of the spine (vertebrae). They make it possible for you to bend, twist, and make other movements with your spine. They also keep you from bending too far, twisting too far, and making other excessive movements. A facet joint block is a procedure where a numbing medicine (anesthetic) is injected into a facet joint. Often, a type of anti-inflammatory medicine called a steroid is also injected. A facet joint block may be done to diagnose neck or back pain. If the pain gets better after a facet joint block, it means the pain is probably coming from the facet joint. If the pain does not get better, it means the pain is probably not coming from the facet joint. A  facet joint block may also be done to relieve neck or back pain caused by an inflamed facet joint. A facet joint block is only done to relieve pain if the pain does not improve with other methods, such as medicine, exercise programs, and physical therapy. Tell a health care provider about:  Any allergies you have.  All medicines you are taking, including vitamins, herbs, eye drops, creams, and over-the-counter medicines.  Any problems you or family members have had with anesthetic medicines.  Any blood disorders you have.  Any surgeries you have had.  Any medical conditions you have.  Whether you are pregnant or may be pregnant. What are the risks? Generally, this is a safe procedure. However, problems may occur, including:  Bleeding.  Injury to a nerve near the injection site.  Pain at the injection site.  Weakness or numbness in areas controlled by nerves near the injection site.  Infection.  Temporary fluid retention.  Allergic reactions to medicines or  dyes.  Injury to other structures or organs near the injection site. What happens before the procedure?  Follow instructions from your health care provider about eating or drinking restrictions.  Ask your health care provider about:  Changing or stopping your regular medicines. This is especially important if you are taking diabetes medicines or blood thinners.  Taking medicines such as aspirin and ibuprofen. These medicines can thin your blood. Do not take these medicines before your procedure if your health care provider instructs you not to.  Do not take any new dietary supplements or medicines without asking your health care provider first.  Plan to have someone take you home after the procedure. What happens during the procedure?  You may need to remove your clothing and dress in an open-back gown.  The procedure will be done while you are lying on an X-ray table. You will most likely be asked to lie on your  stomach, but you may be asked to lie in a different position if an injection will be made in your neck.  Machines will be used to monitor your oxygen levels, heart rate, and blood pressure.  If an injection will be made in your neck, an IV tube will be inserted into one of your veins. Fluids and medicine will flow directly into your body through the IV tube.  The area over the facet joint where the injection will be made will be cleaned with soap. The surrounding skin will be covered with clean drapes.  A numbing medicine (local anesthetic) will be applied to your skin. Your skin may sting or burn for a moment.  A video X-ray machine (fluoroscopy) will be used to locate the joint. In some cases, a CT scan may be used.  A contrast dye may be injected into the facet joint area to help locate the joint.  When the joint is located, an anesthetic will be injected into the joint through the needle.  Your health care provider will ask you whether you feel pain relief. If you do feel relief, a steroid may be injected to provide pain relief for a longer period of time. If you do not feel relief or feel only partial relief, additional injections of an anesthetic may be made in other facet joints.  The needle will be removed.  Your skin will be cleaned.  A bandage (dressing) will be applied over each injection site. The procedure may vary among health care providers and hospitals. What happens after the procedure?  You will be observed for 15-30 minutes before being allowed to go home. This information is not intended to replace advice given to you by your health care provider. Make sure you discuss any questions you have with your health care provider. Document Released: 05/08/2007 Document Revised: 01/18/2016 Document Reviewed: 09/12/2015 Elsevier Interactive Patient Education  2017 [employer].

## 2017-05-06 ENCOUNTER — Telehealth: Payer: Self-pay

## 2017-05-06 NOTE — Telephone Encounter (Signed)
Has Dr. Laban EmperorNaveira received the results of her MRI done at [facility] through [employer] and workers comp? If so, may I go ahead and request a follow up appointment from her adjuster? I wanted to make sure you had the MRI results before requesting the follow up. Thank you

## 2017-05-06 NOTE — Telephone Encounter (Signed)
Spoke with Alona BeneJoyce. She is going to call and request report from adjuster. We do not have the report as of today 05/06/2017.

## 2017-05-08 NOTE — Telephone Encounter (Signed)
MRI results obtained and given to Dr. Laban EmperorNaveira to review. Request sent to adjuster for a follow up visit and/or a facet block that was ordered last visit.

## 2017-06-11 ENCOUNTER — Other Ambulatory Visit: Payer: Self-pay

## 2017-06-11 DIAGNOSIS — G894 Chronic pain syndrome: Secondary | ICD-10-CM

## 2017-06-12 ENCOUNTER — Other Ambulatory Visit
Admission: RE | Admit: 2017-06-12 | Discharge: 2017-06-12 | Disposition: A | Discharge status: Home / Self Care | Payer: Worker's Compensation | Source: Ambulatory Visit | Attending: Pain Medicine | Admitting: Pain Medicine

## 2017-06-12 DIAGNOSIS — G894 Chronic pain syndrome: Secondary | ICD-10-CM | POA: Insufficient documentation

## 2017-06-12 LAB — SEDIMENTATION RATE: Sed Rate: 6 mm/hr (ref 0–30)

## 2017-06-12 LAB — C-REACTIVE PROTEIN: CRP: <0.8 mg/dL (ref <1.0)

## 2017-06-12 LAB — COMPREHENSIVE METABOLIC PANEL
ALBUMIN: 4.5 g/dL (ref 3.5–5.0)
ALT: 18 U/L (ref 14–54)
ANION GAP: 7 (ref 5–15)
AST: 21 U/L (ref 15–41)
Alkaline Phosphatase: 54 U/L (ref 38–126)
BUN: 10 mg/dL (ref 6–20)
CO2: 27 mmol/L (ref 22–32)
Calcium: 9.6 mg/dL (ref 8.9–10.3)
Chloride: 106 mmol/L (ref 101–111)
Creatinine, Ser: 0.71 mg/dL (ref 0.44–1.00)
GFR calc Af Amer: >60 mL/min (ref >60)
GFR calc non Af Amer: >60 mL/min (ref >60)
GLUCOSE: 103 mg/dL — AB (ref 65–99)
POTASSIUM: 4.2 mmol/L (ref 3.5–5.1)
SODIUM: 140 mmol/L (ref 135–145)
Total Bilirubin: 0.7 mg/dL (ref 0.3–1.2)
Total Protein: 7.3 g/dL (ref 6.5–8.1)

## 2017-06-12 LAB — MAGNESIUM: MAGNESIUM: 1.9 mg/dL (ref 1.7–2.4)

## 2017-06-13 LAB — VITAMIN D 25 HYDROXY (VIT D DEFICIENCY, FRACTURES): VIT D 25 HYDROXY: 22.2 ng/mL — AB (ref 30.0–100.0)

## 2017-06-14 LAB — VITAMIN B1: Vitamin B1 (Thiamine): 131.6 nmol/L (ref 66.5–200.0)

## 2017-06-27 ENCOUNTER — Ambulatory Visit: Payer: Worker's Compensation | Attending: Pain Medicine | Admitting: Pain Medicine

## 2017-06-27 ENCOUNTER — Telehealth: Payer: Self-pay | Admitting: *Deleted

## 2017-06-27 ENCOUNTER — Encounter: Payer: Self-pay | Admitting: Pain Medicine

## 2017-06-27 VITALS — BP 146/71 | HR 73 | Temp 98.3°F | Resp 16 | Ht 67.0 in | Wt 145.0 lb

## 2017-06-27 DIAGNOSIS — G8929 Other chronic pain: Secondary | ICD-10-CM | POA: Diagnosis not present

## 2017-06-27 DIAGNOSIS — Z79891 Long term (current) use of opiate analgesic: Secondary | ICD-10-CM | POA: Diagnosis not present

## 2017-06-27 DIAGNOSIS — M797 Fibromyalgia: Secondary | ICD-10-CM | POA: Diagnosis not present

## 2017-06-27 DIAGNOSIS — F411 Generalized anxiety disorder: Secondary | ICD-10-CM | POA: Insufficient documentation

## 2017-06-27 DIAGNOSIS — M5412 Radiculopathy, cervical region: Secondary | ICD-10-CM | POA: Diagnosis not present

## 2017-06-27 DIAGNOSIS — M25552 Pain in left hip: Secondary | ICD-10-CM | POA: Diagnosis not present

## 2017-06-27 DIAGNOSIS — K219 Gastro-esophageal reflux disease without esophagitis: Secondary | ICD-10-CM | POA: Insufficient documentation

## 2017-06-27 DIAGNOSIS — M4692 Unspecified inflammatory spondylopathy, cervical region: Secondary | ICD-10-CM

## 2017-06-27 DIAGNOSIS — G894 Chronic pain syndrome: Secondary | ICD-10-CM | POA: Insufficient documentation

## 2017-06-27 DIAGNOSIS — R937 Abnormal findings on diagnostic imaging of other parts of musculoskeletal system: Secondary | ICD-10-CM | POA: Diagnosis not present

## 2017-06-27 DIAGNOSIS — M25511 Pain in right shoulder: Secondary | ICD-10-CM | POA: Insufficient documentation

## 2017-06-27 DIAGNOSIS — M961 Postlaminectomy syndrome, not elsewhere classified: Secondary | ICD-10-CM | POA: Diagnosis not present

## 2017-06-27 DIAGNOSIS — M47812 Spondylosis without myelopathy or radiculopathy, cervical region: Secondary | ICD-10-CM | POA: Diagnosis not present

## 2017-06-27 DIAGNOSIS — Z5181 Encounter for therapeutic drug level monitoring: Secondary | ICD-10-CM | POA: Diagnosis not present

## 2017-06-27 DIAGNOSIS — M25551 Pain in right hip: Secondary | ICD-10-CM | POA: Insufficient documentation

## 2017-06-27 DIAGNOSIS — R938 Abnormal findings on diagnostic imaging of other specified body structures: Secondary | ICD-10-CM | POA: Diagnosis not present

## 2017-06-27 DIAGNOSIS — M542 Cervicalgia: Secondary | ICD-10-CM | POA: Diagnosis not present

## 2017-06-27 DIAGNOSIS — R29898 Other symptoms and signs involving the musculoskeletal system: Secondary | ICD-10-CM | POA: Diagnosis not present

## 2017-06-27 DIAGNOSIS — F329 Major depressive disorder, single episode, unspecified: Secondary | ICD-10-CM | POA: Insufficient documentation

## 2017-06-27 DIAGNOSIS — M549 Dorsalgia, unspecified: Secondary | ICD-10-CM | POA: Diagnosis not present

## 2017-06-27 DIAGNOSIS — F1320 Sedative, hypnotic or anxiolytic dependence, uncomplicated: Secondary | ICD-10-CM | POA: Diagnosis not present

## 2017-06-27 DIAGNOSIS — M25512 Pain in left shoulder: Secondary | ICD-10-CM | POA: Insufficient documentation

## 2017-06-27 DIAGNOSIS — M792 Neuralgia and neuritis, unspecified: Secondary | ICD-10-CM

## 2017-06-27 DIAGNOSIS — M62838 Other muscle spasm: Secondary | ICD-10-CM | POA: Insufficient documentation

## 2017-06-27 DIAGNOSIS — Z7982 Long term (current) use of aspirin: Secondary | ICD-10-CM | POA: Insufficient documentation

## 2017-06-27 DIAGNOSIS — M47892 Other spondylosis, cervical region: Secondary | ICD-10-CM | POA: Insufficient documentation

## 2017-06-27 DIAGNOSIS — Z79899 Other long term (current) drug therapy: Secondary | ICD-10-CM | POA: Insufficient documentation

## 2017-06-27 MED ORDER — CYCLOBENZAPRINE HCL 5 MG PO TABS: 5.0000 mg | ORAL_TABLET | Freq: Two times a day (BID) | ORAL | 5 refills | Status: DC | PRN

## 2017-06-27 MED ORDER — MELOXICAM 15 MG PO TABS: 15.0000 mg | ORAL_TABLET | Freq: Every day | ORAL | 5 refills | Status: DC

## 2017-06-27 MED ORDER — GABAPENTIN 100 MG PO CAPS: 100.0000 mg | ORAL_CAPSULE | Freq: Every day | ORAL | 0 refills | Status: DC

## 2017-06-27 NOTE — Progress Notes (Signed)
Safety precautions to be maintained throughout the outpatient stay will include: orient to surroundings, keep bed in low position, maintain call bell within reach at all times, provide assistance with transfer out of bed and ambulation.  

## 2017-06-27 NOTE — Patient Instructions (Addendum)
____________________________________________________________________________________________  Preparing for Procedure with Sedation Instructions: . Oral Intake: Do not eat or drink anything for at least 8 hours prior to your procedure. . Transportation: Public transportation is not allowed. Bring an adult driver. The driver must be physically present in our waiting room before any procedure can be started. . Physical Assistance: Bring an adult physically capable of assisting you, in the event you need help. This adult should keep you company at home for at least 6 hours after the procedure. . Blood Pressure Medicine: Take your blood pressure medicine with a sip of water the morning of the procedure. . Blood thinners:  . Diabetics on insulin: Notify the staff so that you can be scheduled 1st case in the morning. If your diabetes requires high dose insulin, take only  of your normal insulin dose the morning of the procedure and notify the staff that you have done so. . Preventing infections: Shower with an antibacterial soap the morning of your procedure. . Build-up your immune system: Take 1000 mg of Vitamin C with every meal (3 times a day) the day prior to your procedure. . Antibiotics: Inform the staff if you have a condition or reason that requires you to take antibiotics before dental procedures. . Pregnancy: If you are pregnant, call and cancel the procedure. . Sickness: If you have a cold, fever, or any active infections, call and cancel the procedure. . Arrival: You must be in the facility at least 30 minutes prior to your scheduled procedure. . Children: Do not bring children with you. . Dress appropriately: Bring dark clothing that you would not mind if they get stained. . Valuables: Do not bring any jewelry or valuables. Procedure appointments are reserved for interventional treatments only. . No Prescription Refills. . No medication changes will be discussed during procedure  appointments. . No disability issues will be discussed. ____________________________________________________________________________________________  Preparing for Procedure with Sedation Instructions: . Oral Intake: Do not eat or drink anything for at least 8 hours prior to your procedure. . Transportation: Public transportation is not allowed. Bring an adult driver. The driver must be physically present in our waiting room before any procedure can be started. . Physical Assistance: Bring an adult capable of physically assisting you, in the event you need help. . Blood Pressure Medicine: Take your blood pressure medicine with a sip of water the morning of the procedure. . Insulin: Take only  of your normal insulin dose. . Preventing infections: Shower with an antibacterial soap the morning of your procedure. . Build-up your immune system: Take 1000 mg of Vitamin C with every meal (3 times a day) the day prior to your procedure. . Pregnancy: If you are pregnant, call and cancel the procedure. . Sickness: If you have a cold, fever, or any active infections, call and cancel the procedure. . Arrival: You must be in the facility at least 30 minutes prior to your scheduled procedure. . Children: Do not bring children with you. . Dress appropriately: Bring dark clothing that you would not mind if they get stained. . Valuables: Do not bring any jewelry or valuables. Procedure appointments are reserved for interventional treatments only. . No Prescription Refills. . No medication changes will be discussed during procedure appointments. No disability issues will be discussed.Facet Blocks Patient Information  Description: The facets are joints in the spine between the vertebrae.  Like any joints in the body, facets can become irritated and painful.  Arthritis can also effect the facets.  By injecting   steroids and local anesthetic in and around these joints, we can temporarily block the nerve supply  to them.  Steroids act directly on irritated nerves and tissues to reduce selling and inflammation which often leads to decreased pain.  Facet blocks may be done anywhere along the spine from the neck to the low back depending upon the location of your pain.   After numbing the skin with local anesthetic (like Novocaine), a small needle is passed onto the facet joints under x-ray guidance.  You may experience a sensation of pressure while this is being done.  The entire block usually lasts about 15-25 minutes.   Conditions which may be treated by facet blocks:   Low back/buttock pain  Neck/shoulder pain  Certain types of headaches  Preparation for the injection:  1. Do not eat any solid food or dairy products within 8 hours of your appointment. 2. You may drink clear liquid up to 3 hours before appointment.  Clear liquids include water, black coffee, juice or soda.  No milk or cream please. 3. You may take your regular medication, including pain medications, with a sip of water before your appointment.  Diabetics should hold regular insulin (if taken separately) and take 1/2 normal NPH dose the morning of the procedure.  Carry some sugar containing items with you to your appointment. 4. A driver must accompany you and be prepared to drive you home after your procedure. 5. Bring all your current medications with you. 6. An IV may be inserted and sedation may be given at the discretion of the physician. 7. A blood pressure cuff, EKG and other monitors will often be applied during the procedure.  Some patients may need to have extra oxygen administered for a short period. 8. You will be asked to provide medical information, including your allergies and medications, prior to the procedure.  We must know immediately if you are taking blood thinners (like Coumadin/Warfarin) or if you are allergic to IV iodine contrast (dye).  We must know if you could possible be pregnant.  Possible  side-effects:   Bleeding from needle site  Infection (rare, may require surgery)  Nerve injury (rare)  Numbness & tingling (temporary)  Difficulty urinating (rare, temporary)  Spinal headache (a headache worse with upright posture)  Light-headedness (temporary)  Pain at injection site (serveral days)  Decreased blood pressure (rare, temporary)  Weakness in arm/leg (temporary)  Pressure sensation in back/neck (temporary)   Call if you experience:   Fever/chills associated with headache or increased back/neck pain  Headache worsened by an upright position  New onset, weakness or numbness of an extremity below the injection site  Hives or difficulty breathing (go to the emergency room)  Inflammation or drainage at the injection site(s)  Severe back/neck pain greater than usual  New symptoms which are concerning to you  Please note:  Although the local anesthetic injected can often make your back or neck feel good for several hours after the injection, the pain will likely return. It takes 3-7 days for steroids to work.  You may not notice any pain relief for at least one week.  If effective, we will often do a series of 2-3 injections spaced 3-6 weeks apart to maximally decrease your pain.  After the initial series, you may be a candidate for a more permanent nerve block of the facets.  If you have any questions, please call [phone] [facility] 

## 2017-06-27 NOTE — Progress Notes (Addendum)
Patient's Name: Teresa [name]  MRN: [id number]  Referring Provider: Suzanna Obey, MD  DOB: December 04, 1961  PCP: Suzanna Obey, MD  DOS: 06/27/2017  Note by: Kathlen Brunswick. Dossie Arbour, MD  Service setting: Ambulatory outpatient  Specialty: Interventional Pain Management  Location: [facility] (AMB) Pain Management Facility    Patient type: Established   Primary Reason(s) for Visit: Evaluation of chronic illnesses with exacerbation, or progression (Level of risk: moderate) CC: Neck Pain (left) and Hip Pain (left)  HPI  Teresa [name] is a 56 y.o. year old, female patient, who comes today for a follow-up evaluation. She has History of panic attacks; Encounter for therapeutic drug level monitoring; Long term current use of opiate analgesic; Benzodiazepine dependence ([place]); Uncomplicated opioid dependence ([place]); Opiate use; Chronic pain syndrome (WC); Chronic upper back pain (WC); Thoracic back pain (WC); Hand pain; H/O cervical spine surgery; Chronic knee pain (Bilateral); Chronic low back pain; Cervical spondylosis (WC); Generalized anxiety disorder; Cervical facet syndrome (WC) (Bilateral) (L>R); Diffuse arthralgia; Tobacco use disorder; Fibromyalgia;  History of anterior cervical discectomy and fusion (ACDF) (left side)  (WC); Cervical radicular pain (WC) (1ry) (Left); Long term prescription opiate use; Encounter for chronic pain management; 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 Ms. Ken was last seen on 04/04/2017. Her primarily concern today  is the Neck Pain (left) and Hip Pain (left)  Pain Assessment: Location: Left, Posterior Neck Radiating: goes up the left side of head(headache) and down to the left hip and down the lateral outside  leg to the mid calf "pulling sensation,sharp pain" Onset: More than a month ago Duration: Chronic pain Quality: Aching, Constant, Radiating, Sharp, Dull, Headache, Tender (pulling pain) Severity: 3 /10 (self-reported pain score)  Note: Reported level is compatible with observation.                   Effect on ADL: pace self Timing: Constant Modifying factors: rest helps, heat and ice, lidocaine cream  Further details on both, my assessment(s), as well as the proposed treatment plan, please see below.  Laboratory Chemistry  Inflammation Markers Lab Results  Component Value Date   CRP <0.8 06/12/2017   ESRSEDRATE 6 06/12/2017   (CRP: Acute Phase) (ESR: Chronic Phase) Renal Function Markers Lab Results  Component Value Date   BUN 10 06/12/2017   CREATININE 0.71 06/12/2017   GFRAA >60 06/12/2017   GFRNONAA >60 06/12/2017   Hepatic Function Markers Lab Results  Component Value Date   AST 21 06/12/2017   ALT 18 06/12/2017   ALBUMIN 4.5 06/12/2017   ALKPHOS 54 06/12/2017   Electrolytes Lab Results  Component Value Date   NA 140 06/12/2017   K 4.2 06/12/2017   CL 106 06/12/2017   CALCIUM 9.6 06/12/2017   MG 1.9 06/12/2017   Neuropathy Markers No results found for: [id number] Bone Pathology Markers Lab Results  Component Value Date   ALKPHOS 54 06/12/2017   VD25OH 22.2 (L) 06/12/2017   CALCIUM 9.6 06/12/2017   Coagulation Parameters No results found for: INR, LABPROT, APTT, PLT Cardiovascular Markers No results found for: BNP, HGB, HCT Note: Lab  results reviewed.  Recent Diagnostic Imaging Review  Cervical Imaging: Cervical DG complete:  Results for orders placed in visit on 09/29/14  DG Cervical Spine Complete   Narrative * PRIOR REPORT IMPORTED FROM AN  EXTERNAL SYSTEM *   CLINICAL DATA:  Injury 07/20/2010 resulting in the cervical spine  surgery. Now with regular headaches, neck pain and LEFT shoulder  pain, numbness in LEFT arm and LEFT hand, unable to grasp objects  for long periods of time, pain radiating to shoulder and up to eyes   EXAM:  CERVICAL SPINE  4+ VIEWS   COMPARISON:  MRI cervical spine 08/31/2010, radiographs 04/22/2013   FINDINGS:  Prevertebral soft tissues normal thickness.   Prior anterior fusion C4-C7 with anterior plate and screws.   Hardware intact.   Disc prostheses at C4-C5, C5-C6 and C6-C7 disc spaces unchanged  versus prior radiographs.   Disc space narrowing with endplate spur formation C3-C4.   No acute fracture, subluxation or bone destruction.   Foramina patent.   Lung apices clear.   IMPRESSION:  No acute abnormalities.   Prior C4-C7 anterior fusion.    Electronically Signed    By: Lavonia Dana M.D.    On: 09/29/2014 16:34       Note: Results of ordered imaging test(s) reviewed and explained to patient in Layman's terms. Copy of results provided to patient  Meds      Current Outpatient Prescriptions (Analgesics):  .  aspirin 325 MG tablet, Take 325 mg by mouth 2 (two) times daily. Marland Kitchen  ibuprofen (ADVIL,MOTRIN) 200 MG tablet, Take 200 mg by mouth every 6 (six) hours as needed. .  meloxicam (MOBIC) 15 MG tablet, Take 1 tablet (15 mg total) by mouth daily.   Current Outpatient Prescriptions (Other):  .  alprazolam (XANAX) 2 MG tablet, Take 2 mg by mouth 2 (two) times daily as needed.  .  cyclobenzaprine (FLEXERIL) 5 MG tablet, Take 1 tablet (5 mg total) by mouth 2 (two) times daily as needed for muscle spasms. Marland Kitchen  FLUoxetine (PROZAC) 20 MG tablet, Take 20 mg by mouth daily. Marland Kitchen  gabapentin (NEURONTIN) 100 MG capsule, Take 1-3 capsules (100-300 mg total) by mouth at bedtime. Follow titration schedule.  ROS  Constitutional: Denies any fever or chills Gastrointestinal: No reported  hemesis, hematochezia, vomiting, or acute GI distress Musculoskeletal: Denies any acute onset joint swelling, redness, loss of ROM, or weakness Neurological: No reported episodes of acute onset apraxia, aphasia, dysarthria, agnosia, amnesia, paralysis, loss of coordination, or loss of consciousness  Allergies  Ms. Gent has no active allergies.  [place]  Drug: Ms. Waldron  reports that she does not use drugs. Alcohol:  reports that she does not drink alcohol. Tobacco:  reports that she has been smoking.  She has never used smokeless tobacco. Medical:  has a past medical history of  History of anterior cervical discectomy and fusion (ACDF) (left side). (10/18/2015); Bilateral hip pain (10/18/2015); Cervical spine pain (WC) (10/18/2015); Cervical spondylosis with myelopathy; Chronic cervical radiculopathy (WC) (10/18/2015); Chronic constipation; Chronic pain syndrome; Depression; Fibromyalgia; GERD (gastroesophageal reflux disease); Insomnia; Migraine; Neck pain; and Panic attack. Surgical: Ms. Dina  has a past surgical history that includes Neck surgery and btl. Family: family history is not on file.  Constitutional Exam  General appearance: Well nourished, well developed, and well hydrated. In no apparent acute distress Vitals:   06/27/17 1018  BP: (!) 146/71  Pulse: 73  Resp: 16  Temp: 98.3 F (36.8 C)  SpO2: 100%  Weight: 145 lb (65.8 kg)  Height: _0  (1.702 m)   BMI Assessment: Estimated body mass index is 22.71 kg/m as calculated from the following:   Height as of this encounter: _1  (1.702 m).   Weight as of this encounter: 145 lb (65.8 kg).  BMI interpretation table: BMI level Category Range association with higher incidence of chronic pain  <18 kg/m2 Underweight   18.5-24.9 kg/m2 Ideal body weight   25-29.9 kg/m2 Overweight Increased incidence by 20%  30-34.9 kg/m2 Obese (Class I) Increased incidence by 68%  35-39.9 kg/m2 Severe obesity (Class II) Increased incidence by  136%  >40 kg/m2 Extreme obesity (Class III) Increased incidence by 254%   BMI Readings from Last 4 Encounters:  06/27/17 22.71 kg/m  04/04/17 21.93 kg/m  02/13/17 23.49 kg/m  01/09/17 22.71 kg/m   Wt Readings from Last 4 Encounters:  06/27/17 145 lb (65.8 kg)  04/04/17 140 lb (63.5 kg)  02/13/17 150 lb (68 kg)  01/09/17 145 lb (65.8 kg)  Psych/Mental status: Alert, oriented x 3 (person, place, & time)       Eyes: PERLA Respiratory: No evidence of acute respiratory distress  Cervical Spine Exam  Inspection: No masses, redness, or swelling Alignment: Symmetrical Functional ROM: Unrestricted ROM      Stability: No instability detected Muscle strength & Tone: Functionally intact Sensory: Unimpaired Palpation: No palpable anomalies              Upper Extremity (UE) Exam    Side: Right upper extremity  Side: Left upper extremity  Inspection: No masses, redness, swelling, or asymmetry. No contractures  Inspection: No masses, redness, swelling, or asymmetry. No contractures  Functional ROM: Unrestricted ROM          Functional ROM: Unrestricted ROM          Muscle strength & Tone: Functionally intact  Muscle strength & Tone: Functionally intact  Sensory: Unimpaired  Sensory: Unimpaired  Palpation: No palpable anomalies              Palpation: No palpable anomalies              Specialized Test(s): Deferred         Specialized Test(s): Deferred          Thoracic Spine Exam  Inspection: No masses, redness, or swelling Alignment: Symmetrical Functional ROM: Unrestricted ROM Stability: No instability detected Sensory: Unimpaired Muscle strength & Tone: No palpable anomalies  Lumbar Spine Exam  Inspection: No masses, redness, or swelling Alignment: Symmetrical Functional ROM: Unrestricted ROM      Stability: No instability detected Muscle strength & Tone: Functionally intact Sensory: Unimpaired Palpation: No palpable anomalies       Provocative Tests: Lumbar Hyperextension  and rotation test: evaluation deferred today       Patrick's Maneuver: evaluation deferred today                    Gait & Posture Assessment  Ambulation: Unassisted Gait: Relatively normal for age and body habitus Posture: WNL   Lower Extremity Exam    Side: Right lower extremity  Side: Left lower extremity  Inspection: No masses, redness, swelling, or asymmetry. No contractures  Inspection: No masses, redness, swelling, or asymmetry. No contractures  Functional ROM: Unrestricted ROM          Functional ROM: Unrestricted ROM          Muscle strength & Tone: Functionally intact  Muscle strength & Tone: Functionally intact  Sensory:  Unimpaired  Sensory: Unimpaired  Palpation: No palpable anomalies  Palpation: No palpable anomalies   Assessment  Primary Diagnosis & Pertinent Problem List: The primary encounter diagnosis was Cervical facet syndrome (WC) (Bilateral) (L>R). 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  Status Diagnosis  Persistent Worsening Worsening 1. Cervical facet syndrome (WC) (Bilateral) (L>R)   2. Chronic neck pain (WC) (2ry) (Bilateral) (L>R)   3. Chronic upper back pain (WC)   4. Chronic pain of both shoulders (2ry) (B) (L>R)   5. Cervical spondylosis (WC)   6. 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 Fibromyalgia   8. Neurogenic pain   9. Muscle spasms of both lower extremities   10. Weakness of left leg   11. Abnormal MRI, cervical spine (04/16/2017)     Problems updated and reviewed during this visit: Problem  Abnormal MRI, cervical spine (04/16/2017)   Solid appearing C4-C7 ACDF. No  residual stenosis or impingement. 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 This narrowing with central protrusion compounded by bone overgrowth and facet disease. Bilateral C4 foraminal narrowing. Mild stenosis without significant cord compression.    Plan of Care  Pharmacotherapy (Medications Ordered): Meds ordered this encounter  Medications  . meloxicam (MOBIC) 15 MG tablet    Sig: Take 1 tablet (15 mg total) by mouth daily.    Dispense:  30 tablet    Refill:  5    Do not place this medication, or any other prescription from our practice, on "Automatic Refill". Patient may have prescription filled one day early if pharmacy is closed on scheduled refill date.  . cyclobenzaprine (FLEXERIL) 5 MG tablet    Sig: Take 1 tablet (5 mg total) by mouth 2 (two) times daily as needed for muscle spasms.    Dispense:  60 tablet    Refill:  5    Do not place this medication, or any other prescription from our practice, on "Automatic Refill". Patient may have prescription filled one day early if pharmacy is closed on scheduled refill date.  . gabapentin (NEURONTIN) 100 MG capsule    Sig: Take 1-3 capsules (100-300 mg total) by mouth at bedtime. Follow titration schedule.    Dispense:  90 capsule    Refill:  0    Do not place this medication, or any other prescription from our practice, on "Automatic Refill". Patient may have prescription filled one day early if pharmacy is closed on scheduled refill date.   New Prescriptions   No medications on file   Medications administered today: Ms. Tisby had no medications administered during this visit.  Lab-work, procedure(s), and/or referral(s): Orders Placed This Encounter  Procedures  . CERVICAL FACET (MEDIAL BRANCH NERVE BLOCK)     Interventional therapies: Planned, scheduled, and/or pending:   Diagnostic Bilateral Cervical Facet Block    Considering:   Diagnostic Left Cervical Facet Block  Left CESI under fluoro and IV sedation.   Palliative PRN  treatment(s):   Diagnostic Left Cervical Facet Block  Left CESI under fluoro and IV sedation.   Provider-requested follow-up: Return for procedure (w/ sedation):, by MD.  Future Appointments Date Time Provider [place]  07/10/2017 9:30 AM Milinda Pointer, MD  [place] None   Primary Care Physician: Suzanna Obey, MD Location: [facility] Note by: Kathlen Brunswick. Dossie Arbour, M.D, DABA, DABAPM, DABPM, DABIPP, FIPP Date: 06/27/2017; Time: 11:43 AM  Patient Instructions  ____________________________________________________________________________________________  Preparing for Procedure with Sedation Instructions: . Oral Intake: Do not eat or drink anything for at least 8 hours prior to your procedure. . Transportation: Public transportation is not allowed. Bring an adult driver. The driver must be physically present in our waiting room before any procedure can be started. Marland Kitchen Physical Assistance: Bring an adult physically capable of assisting you, in the event you need help. This adult should keep you company at home for at least 6 hours after the procedure. . Blood Pressure Medicine: Take your blood pressure medicine with a sip of water the morning of the procedure. . Blood thinners:  . Diabetics on insulin: Notify the staff so that you can be scheduled 1st case in the morning. If your diabetes requires high dose insulin, take only  of your normal insulin dose the morning of the procedure and notify the staff that you have done so. . Preventing infections: Shower with an antibacterial soap the morning of your procedure. . Build-up your immune system: Take 1000 mg of Vitamin C with every meal (3 times a day) the day prior to your procedure. Marland Kitchen Antibiotics: Inform the staff if you have a condition or reason that requires you to take antibiotics before dental procedures. . Pregnancy: If you are pregnant, call and cancel the procedure. . Sickness: If you  have a cold, fever, or any active infections, call and cancel the procedure. . Arrival: You must be in the facility at least 30 minutes prior to your scheduled procedure. . Children: Do not bring children with you. . Dress appropriately: Bring dark clothing that you would not mind if they get stained. . Valuables: Do not bring any jewelry or valuables. Procedure appointments are reserved for interventional treatments only. Marland Kitchen No Prescription Refills. . No medication changes will be discussed during procedure appointments. . No disability issues will be discussed. ____________________________________________________________________________________________  Preparing for Procedure with Sedation Instructions: . Oral Intake: Do not eat or drink anything for at least 8 hours prior to your procedure. . Transportation: Public transportation is not allowed. Bring an adult driver. The driver must be physically present in our waiting room before any procedure can be started. Marland Kitchen Physical Assistance: Bring an adult capable of physically assisting you, in the event you need help. . Blood Pressure Medicine: Take your blood pressure medicine with a sip of water the morning of the procedure. . Insulin: Take only  of your normal insulin dose. . Preventing infections: Shower with an antibacterial soap the morning of your procedure. . Build-up your immune system: Take 1000 mg of Vitamin C with every meal (3 times a day) the day prior to your procedure. . Pregnancy: If you are pregnant, call and cancel the procedure. . Sickness: If you have a cold, fever, or any active infections, call and cancel the procedure. . Arrival: You must be in the facility at least 30 minutes prior to your scheduled procedure. . Children: Do not bring children with you. . Dress appropriately: Bring dark clothing that you would not mind if they get stained. . Valuables: Do not bring any jewelry or valuables. Procedure appointments are  reserved for interventional treatments only. Marland Kitchen No Prescription Refills. . No medication changes will be discussed during procedure appointments. No disability issues will be discussed.Facet Blocks  Patient Information  Description: The facets are joints in the spine between the vertebrae.  Like any joints in the body, facets can become irritated and painful.  Arthritis can also effect the facets.  By injecting steroids and local anesthetic in and around these joints, we can temporarily block the nerve supply to them.  Steroids act directly on irritated nerves and tissues to reduce selling and inflammation which often leads to decreased pain.  Facet blocks may be done anywhere along the spine from the neck to the low back depending upon the location of your pain.   After numbing the skin with local anesthetic (like Novocaine), a small needle is passed onto the facet joints under x-ray guidance.  You may experience a sensation of pressure while this is being done.  The entire block usually lasts about 15-25 minutes.   Conditions which may be treated by facet blocks:   Low back/buttock pain  Neck/shoulder pain  Certain types of headaches  Preparation for the injection:  1. Do not eat any solid food or dairy products within 8 hours of your appointment. 2. You may drink clear liquid up to 3 hours before appointment.  Clear liquids include water, black coffee, juice or soda.  No milk or cream please. 3. You may take your regular medication, including pain medications, with a sip of water before your appointment.  Diabetics should hold regular insulin (if taken separately) and take 1/2 normal NPH dose the morning of the procedure.  Carry some sugar containing items with you to your appointment. 4. A driver must accompany you and be prepared to drive you home after your procedure. 5. Bring all your current medications with you. 6. An IV may be inserted and sedation may be given at the discretion of  the physician. 7. A blood pressure cuff, EKG and other monitors will often be applied during the procedure.  Some patients may need to have extra oxygen administered for a short period. 8. You will be asked to provide medical information, including your allergies and medications, prior to the procedure.  We must know immediately if you are taking blood thinners (like Coumadin/Warfarin) or if you are allergic to IV iodine contrast (dye).  We must know if you could possible be pregnant.  Possible side-effects:   Bleeding from needle site  Infection (rare, may require surgery)  Nerve injury (rare)  Numbness & tingling (temporary)  Difficulty urinating (rare, temporary)  Spinal headache (a headache worse with upright posture)  Light-headedness (temporary)  Pain at injection site (serveral days)  Decreased blood pressure (rare, temporary)  Weakness in arm/leg (temporary)  Pressure sensation in back/neck (temporary)   Call if you experience:   Fever/chills associated with headache or increased back/neck pain  Headache worsened by an upright position  New onset, weakness or numbness of an extremity below the injection site  Hives or difficulty breathing (go to the emergency room)  Inflammation or drainage at the injection site(s)  Severe back/neck pain greater than usual  New symptoms which are concerning to you  Please note:  Although the local anesthetic injected can often make your back or neck feel good for several hours after the injection, the pain will likely return. It takes 3-7 days for steroids to work.  You may not notice any pain relief for at least one week.  If effective, we will often do a series of 2-3 injections spaced 3-6 weeks apart to maximally decrease your pain.  After the initial series, you  may be a candidate for a more permanent nerve block of the facets.  If you have any questions, please call #[phone]) [facility]

## 2017-07-02 DIAGNOSIS — R937 Abnormal findings on diagnostic imaging of other parts of musculoskeletal system: Secondary | ICD-10-CM | POA: Insufficient documentation

## 2017-07-10 ENCOUNTER — Ambulatory Visit: Payer: Worker's Compensation | Attending: Pain Medicine | Admitting: Pain Medicine

## 2017-07-10 ENCOUNTER — Encounter: Payer: Self-pay | Admitting: Pain Medicine

## 2017-07-10 ENCOUNTER — Ambulatory Visit
Admission: RE | Admit: 2017-07-10 | Discharge: 2017-07-10 | Disposition: A | Discharge status: Home / Self Care | Payer: Worker's Compensation | Source: Ambulatory Visit | Attending: Pain Medicine | Admitting: Pain Medicine

## 2017-07-10 VITALS — BP 122/64 | HR 66 | Temp 96.1°F | Resp 14 | Ht 67.0 in | Wt 145.0 lb

## 2017-07-10 DIAGNOSIS — M47812 Spondylosis without myelopathy or radiculopathy, cervical region: Secondary | ICD-10-CM

## 2017-07-10 DIAGNOSIS — G8929 Other chronic pain: Secondary | ICD-10-CM | POA: Insufficient documentation

## 2017-07-10 DIAGNOSIS — M542 Cervicalgia: Secondary | ICD-10-CM | POA: Insufficient documentation

## 2017-07-10 DIAGNOSIS — M488X2 Other specified spondylopathies, cervical region: Secondary | ICD-10-CM | POA: Diagnosis not present

## 2017-07-10 DIAGNOSIS — M4692 Unspecified inflammatory spondylopathy, cervical region: Secondary | ICD-10-CM | POA: Diagnosis not present

## 2017-07-10 DIAGNOSIS — Z79899 Other long term (current) drug therapy: Secondary | ICD-10-CM | POA: Insufficient documentation

## 2017-07-10 MED ORDER — DEXAMETHASONE SODIUM PHOSPHATE 10 MG/ML IJ SOLN
10.0000 mg | Freq: Once | INTRAMUSCULAR | Status: AC
  Administered 2017-07-10: 10 mg

## 2017-07-10 MED ORDER — LACTATED RINGERS IV SOLN
1000.0000 mL | Freq: Once | INTRAVENOUS | Status: AC
Start: 2017-07-10 — End: 2017-07-10
  Administered 2017-07-10: 1000 mL via INTRAVENOUS

## 2017-07-10 MED ORDER — MIDAZOLAM HCL 5 MG/5ML IJ SOLN
INTRAMUSCULAR | Status: AC
  Filled 2017-07-10: qty 5

## 2017-07-10 MED ORDER — ROPIVACAINE HCL 2 MG/ML IJ SOLN
9.0000 mL | Freq: Once | INTRAMUSCULAR | Status: AC
  Administered 2017-07-10: 10 mL via PERINEURAL

## 2017-07-10 MED ORDER — FENTANYL CITRATE (PF) 100 MCG/2ML IJ SOLN
25.0000 ug | INTRAMUSCULAR | Status: DC | PRN
  Administered 2017-07-10: 100 ug via INTRAVENOUS

## 2017-07-10 MED ORDER — FENTANYL CITRATE (PF) 100 MCG/2ML IJ SOLN
INTRAMUSCULAR | Status: AC
  Filled 2017-07-10: qty 2

## 2017-07-10 MED ORDER — ROPIVACAINE HCL 2 MG/ML IJ SOLN
INTRAMUSCULAR | Status: AC
  Filled 2017-07-10: qty 20

## 2017-07-10 MED ORDER — MIDAZOLAM HCL 5 MG/5ML IJ SOLN
1.0000 mg | INTRAMUSCULAR | Status: DC | PRN
  Administered 2017-07-10: 4 mg via INTRAVENOUS

## 2017-07-10 MED ORDER — DEXAMETHASONE SODIUM PHOSPHATE 10 MG/ML IJ SOLN
INTRAMUSCULAR | Status: AC
  Filled 2017-07-10: qty 2

## 2017-07-10 MED ORDER — LIDOCAINE HCL (PF) 1.5 % IJ SOLN
20.0000 mL | Freq: Once | INTRAMUSCULAR | Status: AC
Start: 2017-07-10 — End: 2017-07-10
  Administered 2017-07-10: 20 mL
  Filled 2017-07-10: qty 20

## 2017-07-10 NOTE — Patient Instructions (Addendum)
____________________________________________________________________________________________  Post-Procedure instructions Instructions:  Apply ice: Fill a plastic sandwich bag with crushed ice. Cover it with a small towel and apply to injection site. Apply for 15 minutes then remove x 15 minutes. Repeat sequence on day of procedure, until you go to bed. The purpose is to minimize swelling and discomfort after procedure.  Apply heat: Apply heat to procedure site starting the day following the procedure. The purpose is to treat any soreness and discomfort from the procedure.  Food intake: Start with clear liquids (like water) and advance to regular food, as tolerated.   Physical activities: Keep activities to a minimum for the first 8 hours after the procedure.   Driving: If you have received any sedation, you are not allowed to drive for 24 hours after your procedure.  Blood thinner: Restart your blood thinner 6 hours after your procedure. (Only for those taking blood thinners)  Insulin: As soon as you can eat, you may resume your normal dosing schedule. (Only for those taking insulin)  Infection prevention: Keep procedure site clean and dry.  Post-procedure Pain Diary: Extremely important that this be done correctly and accurately. Recorded information will be used to determine the next step in treatment.  Pain evaluated is that of treated area only. Do not include pain from an untreated area.  Complete every hour, on the hour, for the initial 8 hours. Set an alarm to help you do this part accurately.  Do not go to sleep and have it completed later. It will not be accurate.  Follow-up appointment: Keep your follow-up appointment after the procedure. Usually 2 weeks for most procedures. (6 weeks in the case of radiofrequency.) Bring you pain diary.  Expect:  From numbing medicine (AKA: Local Anesthetics): Numbness or decrease in pain.  Onset: Full effect within 15 minutes of  injected.  Duration: It will depend on the type of local anesthetic used. On the average, 1 to 8 hours.   From steroids: Decrease in swelling or inflammation. Once inflammation is improved, relief of the pain will follow.  Onset of benefits: Depends on the amount of swelling present. The more swelling, the longer it will take for the benefits to be seen. In some cases, up to 10 days.  Duration: Steroids will stay in the system x 2 weeks. Duration of benefits will depend on multiple posibilities including persistent irritating factors.  From procedure: Some discomfort is to be expected once the numbing medicine wears off. This should be minimal if ice and heat are applied as instructed. Call if:  You experience numbness and weakness that gets worse with time, as opposed to wearing off.  New onset bowel or bladder incontinence. (Spinal procedures only)  Emergency Numbers:  Durning business hours (Monday - Thursday, 8:00 AM - 4:00 PM) (Friday, 9:00 AM - 12:00 Noon): [phone]  After hours: [phone] ____________________________________________________________________________________________  Pain Management Discharge Instructions  General Discharge Instructions :  If you need to reach your doctor call: Monday-Friday 8:00 am - 4:00 pm at [phone] or toll free [phone].  After clinic hours [phone] to have operator reach doctor.  Bring all of your medication bottles to all your appointments in the pain clinic.  To cancel or reschedule your appointment with Pain Management please remember to call 24 hours in advance to avoid a fee.  Refer to the educational materials which you have been given on: General Risks, I had my Procedure. Discharge Instructions, Post Sedation.  Post Procedure Instructions:  The drugs you   were given will stay in your system until tomorrow, so for the next 24 hours you should not drive, make any legal decisions or drink any alcoholic  beverages.  You may eat anything you prefer, but it is better to start with liquids then soups and crackers, and gradually work up to solid foods.  Please notify your doctor immediately if you have any unusual bleeding, trouble breathing or pain that is not related to your normal pain.  Depending on the type of procedure that was done, some parts of your body may feel week and/or numb.  This usually clears up by tonight or the next day.  Walk with the use of an assistive device or accompanied by an adult for the 24 hours.  You may use ice on the affected area for the first 24 hours.  Put ice in a Ziploc bag and cover with a towel and place against area 15 minutes on 15 minutes off.  You may switch to heat after 24 hours. Facet Joint Block The facet joints connect the bones of the spine (vertebrae). They make it possible for you to bend, twist, and make other movements with your spine. They also keep you from bending too far, twisting too far, and making other excessive movements. A facet joint block is a procedure where a numbing medicine (anesthetic) is injected into a facet joint. Often, a type of anti-inflammatory medicine called a steroid is also injected. A facet joint block may be done to diagnose neck or back pain. If the pain gets better after a facet joint block, it means the pain is probably coming from the facet joint. If the pain does not get better, it means the pain is probably not coming from the facet joint. A facet joint block may also be done to relieve neck or back pain caused by an inflamed facet joint. A facet joint block is only done to relieve pain if the pain does not improve with other methods, such as medicine, exercise programs, and physical therapy. Tell a health care provider about:  Any allergies you have.  All medicines you are taking, including vitamins, herbs, eye drops, creams, and over-the-counter medicines.  Any problems you or family members have had with  anesthetic medicines.  Any blood disorders you have.  Any surgeries you have had.  Any medical conditions you have.  Whether you are pregnant or may be pregnant. What are the risks? Generally, this is a safe procedure. However, problems may occur, including:  Bleeding.  Injury to a nerve near the injection site.  Pain at the injection site.  Weakness or numbness in areas controlled by nerves near the injection site.  Infection.  Temporary fluid retention.  Allergic reactions to medicines or dyes.  Injury to other structures or organs near the injection site.  What happens before the procedure?  Follow instructions from your health care provider about eating or drinking restrictions.  Ask your health care provider about: ? Changing or stopping your regular medicines. This is especially important if you are taking diabetes medicines or blood thinners. ? Taking medicines such as aspirin and ibuprofen. These medicines can thin your blood. Do not take these medicines before your procedure if your health care provider instructs you not to.  Do not take any new dietary supplements or medicines without asking your health care provider first.  Plan to have someone take you home after the procedure. What happens during the procedure?  You may need to remove your   clothing and dress in an open-back gown.  The procedure will be done while you are lying on an X-ray table. You will most likely be asked to lie on your stomach, but you may be asked to lie in a different position if an injection will be made in your neck.  Machines will be used to monitor your oxygen levels, heart rate, and blood pressure.  If an injection will be made in your neck, an IV tube will be inserted into one of your veins. Fluids and medicine will flow directly into your body through the IV tube.  The area over the facet joint where the injection will be made will be cleaned with soap. The surrounding skin  will be covered with clean drapes.  A numbing medicine (local anesthetic) will be applied to your skin. Your skin may sting or burn for a moment.  A video X-ray machine (fluoroscopy) will be used to locate the joint. In some cases, a CT scan may be used.  A contrast dye may be injected into the facet joint area to help locate the joint.  When the joint is located, an anesthetic will be injected into the joint through the needle.  Your health care provider will ask you whether you feel pain relief. If you do feel relief, a steroid may be injected to provide pain relief for a longer period of time. If you do not feel relief or feel only partial relief, additional injections of an anesthetic may be made in other facet joints.  The needle will be removed.  Your skin will be cleaned.  A bandage (dressing) will be applied over each injection site. The procedure may vary among health care providers and hospitals. What happens after the procedure?  You will be observed for 15-30 minutes before being allowed to go home. This information is not intended to replace advice given to you by your health care provider. Make sure you discuss any questions you have with your health care provider. Document Released: 05/08/2007 Document Revised: 01/18/2016 Document Reviewed: 09/12/2015 Elsevier Interactive Patient Education  2018 [place]  Facet Joint Block, Care After Refer to this sheet in the next few weeks. These instructions provide you with information about caring for yourself after your procedure. Your health care provider may also give you more specific instructions. Your treatment has been planned according to current medical practices, but problems sometimes occur. Call your health care provider if you have any problems or questions after your procedure. What can I expect after the procedure? After the procedure, it is common to have: Some tenderness over the injection sites for 2 days  after the procedure. A temporary increase in blood sugar if you have diabetes.  Follow these instructions at home: Keep track of the amount of pain relief you feel and how long it lasts. Take over-the-counter and prescription medicines only as told by your health care provider. You may need to limit pain medicine within the first 4-6 hours after the procedure. Remove your bandages (dressings) the morning after the procedure. For the first 24 hours after the procedure: Do not apply heat near or over the injection sites. Do not take a bath or soak in water, such as in a pool or lake. Do not drive or operate heavy machinery unless approved by your health care provider. Avoid activities that require a lot of energy. If the injection site is tender, try applying ice to the area. To do this: Put ice in a plastic  bag. Place a towel between your skin and the bag. Leave the ice on for 20 minutes, 2-3 times a day. Keep all follow-up visits as told by your health care provider. This is important. Contact a health care provider if: Fluid is coming from an injection site. There is significant bleeding or swelling at an injection site. You have diabetes and your blood sugar is above 180 mg/dL. Get help right away if: You have a fever. You have worsening pain or swelling around an injection site. There are red streaks around an injection site. You develop severe pain that is not controlled by your medicines. You develop a headache, stiff neck, nausea, or vomiting. Your eyes become very sensitive to light. You have weakness, paralysis, or tingling in your arms or legs that was not present before the procedure. You have difficulty urinating or breathing. This information is not intended to replace advice given to you by your health care provider. Make sure you discuss any questions you have with your health care provider. Document Released: 12/03/2012 Document Revised: 05/02/2016 Document Reviewed:  09/12/2015 Elsevier Interactive Patient Education  2018 [employer] GENERAL RISKS AND COMPLICATIONS  What are the risk, side effects and possible complications? Generally speaking, most procedures are safe.  However, with any procedure there are risks, side effects, and the possibility of complications.  The risks and complications are dependent upon the sites that are lesioned, or the type of nerve block to be performed.  The closer the procedure is to the spine, the more serious the risks are.  Great care is taken when placing the radio frequency needles, block needles or lesioning probes, but sometimes complications can occur. Infection: Any time there is an injection through the skin, there is a risk of infection.  This is why sterile conditions are used for these blocks.  There are four possible types of infection. Localized skin infection. Central Nervous System Infection-This can be in the form of Meningitis, which can be deadly. Epidural Infections-This can be in the form of an epidural abscess, which can cause pressure inside of the spine, causing compression of the spinal cord with subsequent paralysis. This would require an emergency surgery to decompress, and there are no guarantees that the patient would recover from the paralysis. Discitis-This is an infection of the intervertebral discs.  It occurs in about 1% of discography procedures.  It is difficult to treat and it may lead to surgery.        2. Pain: the needles have to go through skin and soft tissues, will cause soreness.       3. Damage to internal structures:  The nerves to be lesioned may be near blood vessels or    other nerves which can be potentially damaged.       4. Bleeding: Bleeding is more common if the patient is taking blood thinners such as  aspirin, Coumadin, Ticiid, Plavix, etc., or if he/she have some genetic predisposition  such as hemophilia. Bleeding into the spinal canal can cause compression of the spinal   cord with subsequent paralysis.  This would require an emergency surgery to  decompress and there are no guarantees that the patient would recover from the  paralysis.       5. Pneumothorax:  Puncturing of a lung is a possibility, every time a needle is introduced in  the area of the chest or upper back.  Pneumothorax refers to free air around the  collapsed lung(s), inside of the thoracic cavity (  chest cavity).  Another two possible  complications related to a similar event would include: Hemothorax and Chylothorax.   These are variations of the Pneumothorax, where instead of air around the collapsed  lung(s), you may have blood or chyle, respectively.       6. Spinal headaches: They may occur with any procedures in the area of the spine.       7. Persistent CSF (Cerebro-Spinal Fluid) leakage: This is a rare problem, but may occur  with prolonged intrathecal or epidural catheters either due to the formation of a fistulous  track or a dural tear.       8. Nerve damage: By working so close to the spinal cord, there is always a possibility of  nerve damage, which could be as serious as a permanent spinal cord injury with  paralysis.       9. Death:  Although rare, severe deadly allergic reactions known as "Anaphylactic  reaction" can occur to any of the medications used.      10. Worsening of the symptoms:  We can always make thing worse.  What are the chances of something like this happening? Chances of any of this occuring are extremely low.  By statistics, you have more of a chance of getting killed in a motor vehicle accident: while driving to the hospital than any of the above occurring .  Nevertheless, you should be aware that they are possibilities.  In general, it is similar to taking a shower.  Everybody knows that you can slip, hit your head and get killed.  Does that mean that you should not shower again?  Nevertheless always keep in mind that statistics do not mean anything if you happen to be on  the wrong side of them.  Even if a procedure has a 1 (one) in a 1,000,000 (million) chance of going wrong, it you happen to be that one..Also, keep in mind that by statistics, you have more of a chance of having something go wrong when taking medications.  Who should not have this procedure? If you are on a blood thinning medication (e.g. Coumadin, Plavix, see list of "Blood Thinners"), or if you have an active infection going on, you should not have the procedure.  If you are taking any blood thinners, please inform your physician.  How should I prepare for this procedure? Do not eat or drink anything at least six hours prior to the procedure. Bring a driver with you .  It cannot be a taxi. Come accompanied by an adult that can drive you back, and that is strong enough to help you if your legs get weak or numb from the local anesthetic. Take all of your medicines the morning of the procedure with just enough water to swallow them. If you have diabetes, make sure that you are scheduled to have your procedure done first thing in the morning, whenever possible. If you have diabetes, take only half of your insulin dose and notify our nurse that you have done so as soon as you arrive at the clinic. If you are diabetic, but only take blood sugar pills (oral hypoglycemic), then do not take them on the morning of your procedure.  You may take them after you have had the procedure. Do not take aspirin or any aspirin-containing medications, at least eleven (11) days prior to the procedure.  They may prolong bleeding. Wear loose fitting clothing that may be easy to take off and that you would not  mind if it got stained with Betadine or blood. Do not wear any jewelry or perfume Remove any nail coloring.  It will interfere with some of our monitoring equipment.  NOTE: Remember that this is not meant to be interpreted as a complete list of all possible complications.  Unforeseen problems may occur.  BLOOD  THINNERS The following drugs contain aspirin or other products, which can cause increased bleeding during surgery and should not be taken for 2 weeks prior to and 1 week after surgery.  If you should need take something for relief of minor pain, you may take acetaminophen which is found in Tylenol,m Datril, Anacin-3 and Panadol. It is not blood thinner. The products listed below are.  Do not take any of the products listed below in addition to any listed on your instruction sheet.  A.P.C or A.P.C with Codeine Codeine Phosphate Capsules #3 Ibuprofen Ridaura  ABC compound Congesprin Imuran rimadil  Advil Cope Indocin Robaxisal  Alka-Seltzer Effervescent Pain Reliever and Antacid Coricidin or Coricidin-D  Indomethacin Rufen  Alka-Seltzer plus Cold Medicine Cosprin Ketoprofen S-A-C Tablets  Anacin Analgesic Tablets or Capsules Coumadin Korlgesic Salflex  Anacin Extra Strength Analgesic tablets or capsules CP-2 Tablets Lanoril Salicylate  Anaprox Cuprimine Capsules Levenox Salocol  Anexsia-D Dalteparin Magan Salsalate  Anodynos Darvon compound Magnesium Salicylate Sine-off  Ansaid Dasin Capsules Magsal Sodium Salicylate  Anturane Depen Capsules Marnal Soma  APF Arthritis pain formula Dewitt's Pills Measurin Stanback  Argesic Dia-Gesic Meclofenamic Sulfinpyrazone  Arthritis Bayer Timed Release Aspirin Diclofenac Meclomen Sulindac  Arthritis pain formula Anacin Dicumarol Medipren Supac  Analgesic (Safety coated) Arthralgen Diffunasal Mefanamic Suprofen  Arthritis Strength Bufferin Dihydrocodeine Mepro Compound Suprol  Arthropan liquid Dopirydamole Methcarbomol with Aspirin Synalgos  ASA tablets/Enseals Disalcid Micrainin Tagament  Ascriptin Doan's Midol Talwin  Ascriptin A/D Dolene Mobidin Tanderil  Ascriptin Extra Strength Dolobid Moblgesic Ticlid  Ascriptin with Codeine Doloprin or Doloprin with Codeine Momentum Tolectin  Asperbuf Duoprin Mono-gesic Trendar  Aspergum Duradyne Motrin or Motrin  IB Triminicin  Aspirin plain, buffered or enteric coated Durasal Myochrisine Trigesic  Aspirin Suppositories Easprin Nalfon Trillsate  Aspirin with Codeine Ecotrin Regular or Extra Strength Naprosyn Uracel  Atromid-S Efficin Naproxen Ursinus  Auranofin Capsules Elmiron Neocylate Vanquish  Axotal Emagrin Norgesic Verin  Azathioprine Empirin or Empirin with Codeine Normiflo Vitamin E  Azolid Emprazil Nuprin Voltaren  Bayer Aspirin plain, buffered or children's or timed BC Tablets or powders Encaprin Orgaran Warfarin Sodium  Buff-a-Comp Enoxaparin Orudis Zorpin  Buff-a-Comp with Codeine Equegesic Os-Cal-Gesic   Buffaprin Excedrin plain, buffered or Extra Strength Oxalid   Bufferin Arthritis Strength Feldene Oxphenbutazone   Bufferin plain or Extra Strength Feldene Capsules Oxycodone with Aspirin   Bufferin with Codeine Fenoprofen Fenoprofen Pabalate or Pabalate-SF   Buffets II Flogesic Panagesic   Buffinol plain or Extra Strength Florinal or Florinal with Codeine Panwarfarin   Buf-Tabs Flurbiprofen Penicillamine   Butalbital Compound Four-way cold tablets Penicillin   Butazolidin Fragmin Pepto-Bismol   Carbenicillin Geminisyn Percodan   Carna Arthritis Reliever Geopen Persantine   Carprofen Gold's salt Persistin   Chloramphenicol Goody's Phenylbutazone   Chloromycetin Haltrain Piroxlcam   Clmetidine heparin Plaquenil   Cllnoril Hyco-pap Ponstel   Clofibrate Hydroxy chloroquine Propoxyphen         Before stopping any of these medications, be sure to consult the physician who ordered them.  Some, such as Coumadin (Warfarin) are ordered to prevent or treat serious conditions such as "deep thrombosis", "pumonary embolisms", and other heart problems.  The amount of  time that you may need off of the medication may also vary with the medication and the reason for which you were taking it.  If you are taking any of these medications, please make sure you notify your pain physician before you  undergo any procedures.

## 2017-07-10 NOTE — Progress Notes (Signed)
Safety precautions to be maintained throughout the outpatient stay will include: orient to surroundings, keep bed in low position, maintain call bell within reach at all times, provide assistance with transfer out of bed and ambulation.  

## 2017-07-10 NOTE — Progress Notes (Addendum)
Patient's Name: Teresa [name]  MRN: [id number]  Referring Provider: Delano Metz, MD  DOB: [birth year]  PCP: Lesli Albee, MD  DOS: 07/10/2017  Note by: Oswaldo Done, MD  Service setting: Ambulatory outpatient  Specialty: Interventional Pain Management  Patient type: Established  Location: [facility] (AMB) Pain Management Facility  Visit type: Interventional Procedure   Primary Reason for Visit: Interventional Pain Management Treatment. CC: Neck Pain (left)  Procedure:  Anesthesia, Analgesia, Anxiolysis:  Type: Diagnostic Cervical Facet Medial Branch Block(s) #1 Region: Posterolateral cervical spine region Level: C3, C4, C5, C6, & C7 Medial Branch Level(s) Laterality: Bilateral Paraspinal  Type: Local Anesthesia with Moderate (Conscious) Sedation Local Anesthetic: Lidocaine 1% Route: Intravenous (IV) IV Access: Secured Sedation: Meaningful verbal contact was maintained at all times during the procedure  Indication(s): Analgesia and Anxiety  Indications: 1. Cervical facet syndrome (WC) (Bilateral) (L>R)   2. Chronic neck pain (WC) (Location of Primary Source of Pain) (Bilateral) (L>R)   3. Cervical spondylosis (WC)    Pain Score: Pre-procedure: 3 /10 Post-procedure: 0-No pain/10  Pre-op Assessment:  Previous date of service: 06/27/17 Service provided: Evaluation Teresa [name] is a 56 y.o. (year old), female patient, seen today for interventional treatment. She  has a past surgical history that includes Neck surgery and btl. Teresa [name] has a current medication list which includes the following prescription(s): alprazolam, cyclobenzaprine, fluoxetine, gabapentin, and meloxicam, and the following Facility-Administered Medications: fentanyl and midazolam. Her primarily concern today is the Neck Pain (left)  Initial Vital Signs: Blood pressure 122/81, pulse 66, temperature 98.3 F (36.8 C), temperature source Oral, resp. rate 18, height 5\' 7"  (1.702 m), weight 145 lb (65.8 kg), SpO2  100 %. BMI: 22.71 kg/m  Risk Assessment: Allergies: Reviewed. She has No Known Allergies.  Allergy Precautions: None required Coagulopathies: Reviewed. None identified.  Blood-thinner therapy: None at this time Active Infection(s): Reviewed. None identified. Teresa [name] is afebrile  Site Confirmation: Teresa [name] was asked to confirm the procedure and laterality before marking the site Procedure checklist: Completed Consent: Before the procedure and under the influence of no sedative(s), amnesic(s), or anxiolytics, the patient was informed of the treatment options, risks and possible complications. To fulfill our ethical and legal obligations, as recommended by the American Medical Association's Code of Ethics, I have informed the patient of my clinical impression; the nature and purpose of the treatment or procedure; the risks, benefits, and possible complications of the intervention; the alternatives, including doing nothing; the risk(s) and benefit(s) of the alternative treatment(s) or procedure(s); and the risk(s) and benefit(s) of doing nothing. The patient was provided information about the general risks and possible complications associated with the procedure. These may include, but are not limited to: failure to achieve desired goals, infection, bleeding, organ or nerve damage, allergic reactions, paralysis, and death. In addition, the patient was informed of those risks and complications associated to Spine-related procedures, such as failure to decrease pain; infection (i.e.: Meningitis, epidural or intraspinal abscess); bleeding (i.e.: epidural hematoma, subarachnoid hemorrhage, or any other type of intraspinal or peri-dural bleeding); organ or nerve damage (i.e.: Any type of peripheral nerve, nerve root, or spinal cord injury) with subsequent damage to sensory, motor, and/or autonomic systems, resulting in permanent pain, numbness, and/or weakness of one or several areas of the body; allergic  reactions; (i.e.: anaphylactic reaction); and/or death. Furthermore, the patient was informed of those risks and complications associated with the medications. These include, but are not limited to: allergic reactions (i.e.: anaphylactic or anaphylactoid reaction(s));  adrenal axis suppression; blood sugar elevation that in diabetics may result in ketoacidosis or comma; water retention that in patients with history of congestive heart failure may result in shortness of breath, pulmonary edema, and decompensation with resultant heart failure; weight gain; swelling or edema; medication-induced neural toxicity; particulate matter embolism and blood vessel occlusion with resultant organ, and/or nervous system infarction; and/or aseptic necrosis of one or more joints. Finally, the patient was informed that Medicine is not an exact science; therefore, there is also the possibility of unforeseen or unpredictable risks and/or possible complications that may result in a catastrophic outcome. The patient indicated having understood very clearly. We have given the patient no guarantees and we have made no promises. Enough time was given to the patient to ask questions, all of which were answered to the patient's satisfaction. Teresa [name] has indicated that she wanted to continue with the procedure. Attestation: I, the ordering provider, attest that I have discussed with the patient the benefits, risks, side-effects, alternatives, likelihood of achieving goals, and potential problems during recovery for the procedure that I have provided informed consent. Date: 07/10/2017; Time: 10:08 AM  Pre-Procedure Preparation:  Monitoring: As per clinic protocol. Respiration, ETCO2, SpO2, BP, heart rate and rhythm monitor placed and checked for adequate function Safety Precautions: Patient was assessed for positional comfort and pressure points before starting the procedure. Time-out: I initiated and conducted the "Time-out" before  starting the procedure, as per protocol. The patient was asked to participate by confirming the accuracy of the "Time Out" information. Verification of the correct person, site, and procedure were performed and confirmed by me, the nursing staff, and the patient. "Time-out" conducted as per Joint Commission's Universal Protocol (UP.01.01.01). "Time-out" Date & Time: 07/10/2017; 1038 hrs.  Description of Procedure Process:   Position: Prone with head of the table was raised to facilitate breathing. Target Area: For Cervical Facet blocks, the target is the postero-lateral waist of the articular pillars at the C3, C4, C5, C6, & C7 levels. Approach: Posterior approach. Area Prepped: Entire Posterior Cervico-thoracic Region Prepping solution: ChloraPrep (2% chlorhexidine gluconate and 70% isopropyl alcohol) Safety Precautions: Aspiration looking for blood return was conducted prior to all injections. At no point did we inject any substances, as a needle was being advanced. No attempts were made at seeking any paresthesias. Safe injection practices and needle disposal techniques used. Medications properly checked for expiration dates. SDV (single dose vial) medications used. Description of the Procedure: Protocol guidelines were followed. The patient was placed in position over the fluoroscopy table. The target area was identified and the area prepped in the usual manner. Skin desensitized using vapocoolant spray. Skin & deeper tissues infiltrated with local anesthetic. Appropriate amount of time allowed to pass for local anesthetics to take effect. The procedure needle was introduced through the skin, ipsilateral to the reported pain, and advanced to the target area. Bone was contacted on the posterior aspect of the articular pillars and the needle walked lateral, until the border was cleared. Lateral views taken to make sure the needle tip did not advance past the posterior third of the lateral mass of the  posterior columns. The procedure was repeated in identical fashion for each level. Negative aspiration confirmed. Solution injected in intermittent fashion, asking for systemic symptoms every 0.5cc of injectate. The needles were then removed and the area cleansed, making sure to leave some of the prepping solution back to take advantage of its long term bactericidal properties. Vitals:   07/10/17 1055 07/10/17  1105 07/10/17 1115 07/10/17 1125  BP: 115/87 139/70 123/64 122/64  Pulse:      Resp: 12 12 12 14   Temp:  (!) 96.1 F (35.6 C)    TempSrc:      SpO2: 98% 96% 100% 100%  Weight:      Height:        Start Time: 1038 hrs. End Time: 1054 hrs. Materials:  Needle(s) Type: Regular needle Gauge: 22G Length: 3.5-in Medication(s): We administered lactated ringers, midazolam, fentaNYL, lidocaine, dexamethasone, ropivacaine (PF) 2 mg/mL (0.2%), dexamethasone, and ropivacaine (PF) 2 mg/mL (0.2%). Please see chart orders for dosing details.  Imaging Guidance (Spinal):  Type of Imaging Technique: Fluoroscopy Guidance (Spinal) Indication(s): Assistance in needle guidance and placement for procedures requiring needle placement in or near specific anatomical locations not easily accessible without such assistance. Exposure Time: Please see nurses notes. Contrast: None used. Fluoroscopic Guidance: I was personally present during the use of fluoroscopy. "Tunnel Vision Technique" used to obtain the best possible view of the target area. Parallax error corrected before commencing the procedure. "Direction-depth-direction" technique used to introduce the needle under continuous pulsed fluoroscopy. Once target was reached, antero-posterior, oblique, and lateral fluoroscopic projection used confirm needle placement in all planes. Images permanently stored in EMR. Interpretation: No contrast injected. I personally interpreted the imaging intraoperatively. Adequate needle placement confirmed in multiple planes.  Permanent images saved into the patient's record.  Antibiotic Prophylaxis:  Indication(s): None identified Antibiotic given: None  Post-operative Assessment:  EBL: None Complications: No immediate post-treatment complications observed by team, or reported by patient. Note: The patient tolerated the entire procedure well. A repeat set of vitals were taken after the procedure and the patient was kept under observation following institutional policy, for this type of procedure. Post-procedural neurological assessment was performed, showing return to baseline, prior to discharge. The patient was provided with post-procedure discharge instructions, including a section on how to identify potential problems. Should any problems arise concerning this procedure, the patient was given instructions to immediately contact us, at any time, without hesitation. In any case, we plan to contact the patient by telephone for a follow-up status report regarding this interventional procedure. Comments:  No additional relevant information.  Plan of Care  Disposition: Discharge home  Discharge Date & Time: 07/10/2017; 1130 hrs.  Physician-requested Follow-up:  Return for post-procedure eval (in 2 wks), by MD.  Future Appointments Date Time Provider Department Center  08/06/2017 9:00 AM Delano MetzNaveira, Barrie Sigmund, MD [facility] None   Medications ordered for procedure: Meds ordered this encounter  Medications  . lactated ringers infusion 1,000 mL  . midazolam (VERSED) 5 MG/5ML injection 1-2 mg    Make sure Flumazenil is available in the pyxis when using this medication. If oversedation occurs, administer 0.2 mg IV over 15 sec. If after 45 sec no response, administer 0.2 mg again over 1 min; may repeat at 1 min intervals; not to exceed 4 doses (1 mg)  . fentaNYL (SUBLIMAZE) injection 25-50 mcg    Make sure Narcan is available in the pyxis when using this medication. In the event of respiratory depression (RR< 8/min):  Titrate NARCAN (naloxone) in increments of 0.1 to 0.2 mg IV at 2-3 minute intervals, until desired degree of reversal.  . lidocaine 1.5 % injection 20 mL    From block tray  . dexamethasone (DECADRON) injection 10 mg  . ropivacaine (PF) 2 mg/mL (0.2%) (NAROPIN) injection 9 mL  . dexamethasone (DECADRON) injection 10 mg  . ropivacaine (PF) 2 mg/mL (0.2%) (  NAROPIN) injection 9 mL   Medications administered: We administered lactated ringers, midazolam, fentaNYL, lidocaine, dexamethasone, ropivacaine (PF) 2 mg/mL (0.2%), dexamethasone, and ropivacaine (PF) 2 mg/mL (0.2%).  See the medical record for exact dosing, route, and time of administration.  Lab-work, Procedure(s), & Referral(s) Ordered: Orders Placed This Encounter  Procedures  . DG C-Arm 1-60 Min-No Report  . Informed Consent Details: Transcribe to consent form and obtain patient signature  . Provider attestation of informed consent for procedure/surgical case  . Verify informed consent  . Discharge instructions  . Follow-up   Imaging Ordered: Results for orders placed in visit on 01/09/17  DG C-Arm 1-60 Min-No Report   Narrative There is no Radiologist interpretation  for this exam.   New Prescriptions   No medications on file   Primary Care Physician: Lesli Albee, MD Location: [facility] Note by: Oswaldo Done, MD Date: 07/10/2017; Time: 1:00 PM  Disclaimer:  Medicine is not an exact science. The only guarantee in medicine is that nothing is guaranteed. It is important to note that the decision to proceed with this intervention was based on the information collected from the patient. The Data and conclusions were drawn from the patient's questionnaire, the interview, and the physical examination. Because the information was provided in large part by the patient, it cannot be guaranteed that it has not been purposely or unconsciously manipulated. Every effort has been made to obtain as  much relevant data as possible for this evaluation. It is important to note that the conclusions that lead to this procedure are derived in large part from the available data. Always take into account that the treatment will also be dependent on availability of resources and existing treatment guidelines, considered by other Pain Management Practitioners as being common knowledge and practice, at the time of the intervention. For Medico-Legal purposes, it is also important to point out that variation in procedural techniques and pharmacological choices are the acceptable norm. The indications, contraindications, technique, and results of the above procedure should only be interpreted and judged by a Board-Certified Interventional Pain Specialist with extensive familiarity and expertise in the same exact procedure and technique.  Instructions provided at this appointment: Patient Instructions   ____________________________________________________________________________________________  Post-Procedure instructions Instructions:  Apply ice: Fill a plastic sandwich bag with crushed ice. Cover it with a small towel and apply to injection site. Apply for 15 minutes then remove x 15 minutes. Repeat sequence on day of procedure, until you go to bed. The purpose is to minimize swelling and discomfort after procedure.  Apply heat: Apply heat to procedure site starting the day following the procedure. The purpose is to treat any soreness and discomfort from the procedure.  Food intake: Start with clear liquids (like water) and advance to regular food, as tolerated.   Physical activities: Keep activities to a minimum for the first 8 hours after the procedure.   Driving: If you have received any sedation, you are not allowed to drive for 24 hours after your procedure.  Blood thinner: Restart your blood thinner 6 hours after your procedure. (Only for those taking blood thinners)  Insulin: As soon as you can  eat, you may resume your normal dosing schedule. (Only for those taking insulin)  Infection prevention: Keep procedure site clean and dry.  Post-procedure Pain Diary: Extremely important that this be done correctly and accurately. Recorded information will be used to determine the next step in treatment.  Pain evaluated is that of treated  area only. Do not include pain from an untreated area.  Complete every hour, on the hour, for the initial 8 hours. Set an alarm to help you do this part accurately.  Do not go to sleep and have it completed later. It will not be accurate.  Follow-up appointment: Keep your follow-up appointment after the procedure. Usually 2 weeks for most procedures. (6 weeks in the case of radiofrequency.) Bring you pain diary.  Expect:  From numbing medicine (AKA: Local Anesthetics): Numbness or decrease in pain.  Onset: Full effect within 15 minutes of injected.  Duration: It will depend on the type of local anesthetic used. On the average, 1 to 8 hours.   From steroids: Decrease in swelling or inflammation. Once inflammation is improved, relief of the pain will follow.  Onset of benefits: Depends on the amount of swelling present. The more swelling, the longer it will take for the benefits to be seen. In some cases, up to 10 days.  Duration: Steroids will stay in the system x 2 weeks. Duration of benefits will depend on multiple posibilities including persistent irritating factors.  From procedure: Some discomfort is to be expected once the numbing medicine wears off. This should be minimal if ice and heat are applied as instructed. Call if:  You experience numbness and weakness that gets worse with time, as opposed to wearing off.  New onset bowel or bladder incontinence. (Spinal procedures only)  Emergency Numbers:  Durning business hours (Monday - Thursday, 8:00 AM - 4:00 PM) (Friday, 9:00 AM - 12:00 Noon): [phone]  After hours: [phone]  [phone] ____________________________________________________________________________________________  Pain Management Discharge Instructions  General Discharge Instructions :  If you need to reach your doctor call: Monday-Friday 8:00 am - 4:00 pm at [phone] or toll free [phone].  After clinic hours [phone] to have operator reach doctor.  Bring all of your medication bottles to all your appointments in the pain clinic.  To cancel or reschedule your appointment with Pain Management please remember to call 24 hours in advance to avoid a fee.  Refer to the educational materials which you have been given on: General Risks, I had my Procedure. Discharge Instructions, Post Sedation.  Post Procedure Instructions:  The drugs you were given will stay in your system until tomorrow, so for the next 24 hours you should not drive, make any legal decisions or drink any alcoholic beverages.  You may eat anything you prefer, but it is better to start with liquids then soups and crackers, and gradually work up to solid foods.  Please notify your doctor immediately if you have any unusual bleeding, trouble breathing or pain that is not related to your normal pain.  Depending on the type of procedure that was done, some parts of your body may feel week and/or numb.  This usually clears up by tonight or the next day.  Walk with the use of an assistive device or accompanied by an adult for the 24 hours.  You may use ice on the affected area for the first 24 hours.  Put ice in a Ziploc bag and cover with a towel and place against area 15 minutes on 15 minutes off.  You may switch to heat after 24 hours. Facet Joint Block The facet joints connect the bones of the spine (vertebrae). They make it possible for you to bend, twist, and make other movements with your spine. They also keep you from bending too far, twisting too far, and making other excessive  movements. A facet joint block is a  procedure where a numbing medicine (anesthetic) is injected into a facet joint. Often, a type of anti-inflammatory medicine called a steroid is also injected. A facet joint block may be done to diagnose neck or back pain. If the pain gets better after a facet joint block, it means the pain is probably coming from the facet joint. If the pain does not get better, it means the pain is probably not coming from the facet joint. A facet joint block may also be done to relieve neck or back pain caused by an inflamed facet joint. A facet joint block is only done to relieve pain if the pain does not improve with other methods, such as medicine, exercise programs, and physical therapy. Tell a health care provider about:  Any allergies you have.  All medicines you are taking, including vitamins, herbs, eye drops, creams, and over-the-counter medicines.  Any problems you or family members have had with anesthetic medicines.  Any blood disorders you have.  Any surgeries you have had.  Any medical conditions you have.  Whether you are pregnant or may be pregnant. What are the risks? Generally, this is a safe procedure. However, problems may occur, including:  Bleeding.  Injury to a nerve near the injection site.  Pain at the injection site.  Weakness or numbness in areas controlled by nerves near the injection site.  Infection.  Temporary fluid retention.  Allergic reactions to medicines or dyes.  Injury to other structures or organs near the injection site.  What happens before the procedure?  Follow instructions from your health care provider about eating or drinking restrictions.  Ask your health care provider about: ? Changing or stopping your regular medicines. This is especially important if you are taking diabetes medicines or blood thinners. ? Taking medicines such as aspirin and ibuprofen. These medicines can thin your blood. Do not take these medicines before your procedure if  your health care provider instructs you not to.  Do not take any new dietary supplements or medicines without asking your health care provider first.  Plan to have someone take you home after the procedure. What happens during the procedure?  You may need to remove your clothing and dress in an open-back gown.  The procedure will be done while you are lying on an X-ray table. You will most likely be asked to lie on your stomach, but you may be asked to lie in a different position if an injection will be made in your neck.  Machines will be used to monitor your oxygen levels, heart rate, and blood pressure.  If an injection will be made in your neck, an IV tube will be inserted into one of your veins. Fluids and medicine will flow directly into your body through the IV tube.  The area over the facet joint where the injection will be made will be cleaned with soap. The surrounding skin will be covered with clean drapes.  A numbing medicine (local anesthetic) will be applied to your skin. Your skin may sting or burn for a moment.  A video X-ray machine (fluoroscopy) will be used to locate the joint. In some cases, a CT scan may be used.  A contrast dye may be injected into the facet joint area to help locate the joint.  When the joint is located, an anesthetic will be injected into the joint through the needle.  Your health care provider will ask you whether you feel  pain relief. If you do feel relief, a steroid may be injected to provide pain relief for a longer period of time. If you do not feel relief or feel only partial relief, additional injections of an anesthetic may be made in other facet joints.  The needle will be removed.  Your skin will be cleaned.  A bandage (dressing) will be applied over each injection site. The procedure may vary among health care providers and hospitals. What happens after the procedure?  You will be observed for 15-30 minutes before being allowed to  go home. This information is not intended to replace advice given to you by your health care provider. Make sure you discuss any questions you have with your health care provider. Document Released: 05/08/2007 Document Revised: 01/18/2016 Document Reviewed: 09/12/2015 Elsevier Interactive Patient Education  2018 [employer].  Facet Joint Block, Care After Refer to this sheet in the next few weeks. These instructions provide you with information about caring for yourself after your procedure. Your health care provider may also give you more specific instructions. Your treatment has been planned according to current medical practices, but problems sometimes occur. Call your health care provider if you have any problems or questions after your procedure. What can I expect after the procedure? After the procedure, it is common to have: Some tenderness over the injection sites for 2 days after the procedure. A temporary increase in blood sugar if you have diabetes.  Follow these instructions at home: Keep track of the amount of pain relief you feel and how long it lasts. Take over-the-counter and prescription medicines only as told by your health care provider. You may need to limit pain medicine within the first 4-6 hours after the procedure. Remove your bandages (dressings) the morning after the procedure. For the first 24 hours after the procedure: Do not apply heat near or over the injection sites. Do not take a bath or soak in water, such as in a pool or lake. Do not drive or operate heavy machinery unless approved by your health care provider. Avoid activities that require a lot of energy. If the injection site is tender, try applying ice to the area. To do this: Put ice in a plastic bag. Place a towel between your skin and the bag. Leave the ice on for 20 minutes, 2-3 times a day. Keep all follow-up visits as told by your health care provider. This is important. Contact a health care  provider if: Fluid is coming from an injection site. There is significant bleeding or swelling at an injection site. You have diabetes and your blood sugar is above 180 mg/dL. Get help right away if: You have a fever. You have worsening pain or swelling around an injection site. There are red streaks around an injection site. You develop severe pain that is not controlled by your medicines. You develop a headache, stiff neck, nausea, or vomiting. Your eyes become very sensitive to light. You have weakness, paralysis, or tingling in your arms or legs that was not present before the procedure. You have difficulty urinating or breathing. This information is not intended to replace advice given to you by your health care provider. Make sure you discuss any questions you have with your health care provider. Document Released: 12/03/2012 Document Revised: 05/02/2016 Document Reviewed: 09/12/2015 [employer] Interactive Patient Education  2018 [employer] GENERAL RISKS AND COMPLICATIONS  What are the risk, side effects and possible complications? Generally speaking, most procedures are safe.  However, with  any procedure there are risks, side effects, and the possibility of complications.  The risks and complications are dependent upon the sites that are lesioned, or the type of nerve block to be performed.  The closer the procedure is to the spine, the more serious the risks are.  Great care is taken when placing the radio frequency needles, block needles or lesioning probes, but sometimes complications can occur. Infection: Any time there is an injection through the skin, there is a risk of infection.  This is why sterile conditions are used for these blocks.  There are four possible types of infection. Localized skin infection. Central Nervous System Infection-This can be in the form of Meningitis, which can be deadly. Epidural Infections-This can be in the form of an epidural abscess, which can  cause pressure inside of the spine, causing compression of the spinal cord with subsequent paralysis. This would require an emergency surgery to decompress, and there are no guarantees that the patient would recover from the paralysis. Discitis-This is an infection of the intervertebral discs.  It occurs in about 1% of discography procedures.  It is difficult to treat and it may lead to surgery.        2. Pain: the needles have to go through skin and soft tissues, will cause soreness.       3. Damage to internal structures:  The nerves to be lesioned may be near blood vessels or    other nerves which can be potentially damaged.       4. Bleeding: Bleeding is more common if the patient is taking blood thinners such as  aspirin, Coumadin, Ticiid, Plavix, etc., or if he/she have some genetic predisposition  such as hemophilia. Bleeding into the spinal canal can cause compression of the spinal  cord with subsequent paralysis.  This would require an emergency surgery to  decompress and there are no guarantees that the patient would recover from the  paralysis.       5. Pneumothorax:  Puncturing of a lung is a possibility, every time a needle is introduced in  the area of the chest or upper back.  Pneumothorax refers to free air around the  collapsed lung(s), inside of the thoracic cavity (chest cavity).  Another two possible  complications related to a similar event would include: Hemothorax and Chylothorax.   These are variations of the Pneumothorax, where instead of air around the collapsed  lung(s), you may have blood or chyle, respectively.       6. Spinal headaches: They may occur with any procedures in the area of the spine.       7. Persistent CSF (Cerebro-Spinal Fluid) leakage: This is a rare problem, but may occur  with prolonged intrathecal or epidural catheters either due to the formation of a fistulous  track or a dural tear.       8. Nerve damage: By working so close to the spinal cord, there is  always a possibility of  nerve damage, which could be as serious as a permanent spinal cord injury with  paralysis.       9. Death:  Although rare, severe deadly allergic reactions known as "Anaphylactic  reaction" can occur to any of the medications used.      10. Worsening of the symptoms:  We can always make thing worse.  What are the chances of something like this happening? Chances of any of this occuring are extremely low.  By statistics, you have more of a  chance of getting killed in a motor vehicle accident: while driving to the hospital than any of the above occurring .  Nevertheless, you should be aware that they are possibilities.  In general, it is similar to taking a shower.  Everybody knows that you can slip, hit your head and get killed.  Does that mean that you should not shower again?  Nevertheless always keep in mind that statistics do not mean anything if you happen to be on the wrong side of them.  Even if a procedure has a 1 (one) in a 1,000,000 (million) chance of going wrong, it you happen to be that one..Also, keep in mind that by statistics, you have more of a chance of having something go wrong when taking medications.  Who should not have this procedure? If you are on a blood thinning medication (e.g. Coumadin, Plavix, see list of "Blood Thinners"), or if you have an active infection going on, you should not have the procedure.  If you are taking any blood thinners, please inform your physician.  How should I prepare for this procedure? Do not eat or drink anything at least six hours prior to the procedure. Bring a driver with you .  It cannot be a taxi. Come accompanied by an adult that can drive you back, and that is strong enough to help you if your legs get weak or numb from the local anesthetic. Take all of your medicines the morning of the procedure with just enough water to swallow them. If you have diabetes, make sure that you are scheduled to have your procedure  done first thing in the morning, whenever possible. If you have diabetes, take only half of your insulin dose and notify our nurse that you have done so as soon as you arrive at the clinic. If you are diabetic, but only take blood sugar pills (oral hypoglycemic), then do not take them on the morning of your procedure.  You may take them after you have had the procedure. Do not take aspirin or any aspirin-containing medications, at least eleven (11) days prior to the procedure.  They may prolong bleeding. Wear loose fitting clothing that may be easy to take off and that you would not mind if it got stained with Betadine or blood. Do not wear any jewelry or perfume Remove any nail coloring.  It will interfere with some of our monitoring equipment.  NOTE: Remember that this is not meant to be interpreted as a complete list of all possible complications.  Unforeseen problems may occur.  BLOOD THINNERS The following drugs contain aspirin or other products, which can cause increased bleeding during surgery and should not be taken for 2 weeks prior to and 1 week after surgery.  If you should need take something for relief of minor pain, you may take acetaminophen which is found in Tylenol,m Datril, Anacin-3 and Panadol. It is not blood thinner. The products listed below are.  Do not take any of the products listed below in addition to any listed on your instruction sheet.  A.P.C or A.P.C with Codeine Codeine Phosphate Capsules #3 Ibuprofen Ridaura  ABC compound Congesprin Imuran rimadil  Advil Cope Indocin Robaxisal  Alka-Seltzer Effervescent Pain Reliever and Antacid Coricidin or Coricidin-D  Indomethacin Rufen  Alka-Seltzer plus Cold Medicine Cosprin Ketoprofen S-A-C Tablets  Anacin Analgesic Tablets or Capsules Coumadin Korlgesic Salflex  Anacin Extra Strength Analgesic tablets or capsules CP-2 Tablets Lanoril Salicylate  Anaprox Cuprimine Capsules Levenox Salocol  Anexsia-D Dalteparin  Magan  Salsalate  Anodynos Darvon compound Magnesium Salicylate Sine-off  Ansaid Dasin Capsules Magsal Sodium Salicylate  Anturane Depen Capsules Marnal Soma  APF Arthritis pain formula Dewitt's Pills Measurin Stanback  Argesic Dia-Gesic Meclofenamic Sulfinpyrazone  Arthritis Bayer Timed Release Aspirin Diclofenac Meclomen Sulindac  Arthritis pain formula Anacin Dicumarol Medipren Supac  Analgesic (Safety coated) Arthralgen Diffunasal Mefanamic Suprofen  Arthritis Strength Bufferin Dihydrocodeine Mepro Compound Suprol  Arthropan liquid Dopirydamole Methcarbomol with Aspirin Synalgos  ASA tablets/Enseals Disalcid Micrainin Tagament  Ascriptin Doan's Midol Talwin  Ascriptin A/D Dolene Mobidin Tanderil  Ascriptin Extra Strength Dolobid Moblgesic Ticlid  Ascriptin with Codeine Doloprin or Doloprin with Codeine Momentum Tolectin  Asperbuf Duoprin Mono-gesic Trendar  Aspergum Duradyne Motrin or Motrin IB Triminicin  Aspirin plain, buffered or enteric coated Durasal Myochrisine Trigesic  Aspirin Suppositories Easprin Nalfon Trillsate  Aspirin with Codeine Ecotrin Regular or Extra Strength Naprosyn Uracel  Atromid-S Efficin Naproxen Ursinus  Auranofin Capsules Elmiron Neocylate Vanquish  Axotal Emagrin Norgesic Verin  Azathioprine Empirin or Empirin with Codeine Normiflo Vitamin E  Azolid Emprazil Nuprin Voltaren  Bayer Aspirin plain, buffered or children's or timed BC Tablets or powders Encaprin Orgaran Warfarin Sodium  Buff-a-Comp Enoxaparin Orudis Zorpin  Buff-a-Comp with Codeine Equegesic Os-Cal-Gesic   Buffaprin Excedrin plain, buffered or Extra Strength Oxalid   Bufferin Arthritis Strength Feldene Oxphenbutazone   Bufferin plain or Extra Strength Feldene Capsules Oxycodone with Aspirin   Bufferin with Codeine Fenoprofen Fenoprofen Pabalate or Pabalate-SF   Buffets II Flogesic Panagesic   Buffinol plain or Extra Strength Florinal or Florinal with Codeine Panwarfarin   Buf-Tabs Flurbiprofen  Penicillamine   Butalbital Compound Four-way cold tablets Penicillin   Butazolidin Fragmin Pepto-Bismol   Carbenicillin Geminisyn Percodan   Carna Arthritis Reliever Geopen Persantine   Carprofen Gold's salt Persistin   Chloramphenicol Goody's Phenylbutazone   Chloromycetin Haltrain Piroxlcam   Clmetidine heparin Plaquenil   Cllnoril Hyco-pap Ponstel   Clofibrate Hydroxy chloroquine Propoxyphen         Before stopping any of these medications, be sure to consult the physician who ordered them.  Some, such as Coumadin (Warfarin) are ordered to prevent or treat serious conditions such as "deep thrombosis", "pumonary embolisms", and other heart problems.  The amount of time that you may need off of the medication may also vary with the medication and the reason for which you were taking it.  If you are taking any of these medications, please make sure you notify your pain physician before you undergo any procedures.

## 2017-07-11 ENCOUNTER — Telehealth: Payer: Self-pay

## 2017-07-11 NOTE — Telephone Encounter (Signed)
Denies any needs- states she is doing better and is feeling her diary out. Instructed to bring to next appt and to call if needed

## 2017-08-06 ENCOUNTER — Ambulatory Visit: Payer: Worker's Compensation | Attending: Pain Medicine | Admitting: Pain Medicine

## 2017-08-06 ENCOUNTER — Encounter: Payer: Self-pay | Admitting: Pain Medicine

## 2017-08-06 VITALS — BP 119/73 | HR 79 | Temp 98.2°F | Resp 16 | Ht 67.0 in | Wt 145.0 lb

## 2017-08-06 DIAGNOSIS — Z79899 Other long term (current) drug therapy: Secondary | ICD-10-CM | POA: Insufficient documentation

## 2017-08-06 DIAGNOSIS — M25552 Pain in left hip: Secondary | ICD-10-CM | POA: Insufficient documentation

## 2017-08-06 DIAGNOSIS — F411 Generalized anxiety disorder: Secondary | ICD-10-CM | POA: Diagnosis not present

## 2017-08-06 DIAGNOSIS — F410 Panic disorder [episodic paroxysmal anxiety] without agoraphobia: Secondary | ICD-10-CM | POA: Diagnosis not present

## 2017-08-06 DIAGNOSIS — M47812 Spondylosis without myelopathy or radiculopathy, cervical region: Secondary | ICD-10-CM | POA: Diagnosis not present

## 2017-08-06 DIAGNOSIS — M4692 Unspecified inflammatory spondylopathy, cervical region: Secondary | ICD-10-CM | POA: Diagnosis not present

## 2017-08-06 DIAGNOSIS — K219 Gastro-esophageal reflux disease without esophagitis: Secondary | ICD-10-CM | POA: Insufficient documentation

## 2017-08-06 DIAGNOSIS — G8929 Other chronic pain: Secondary | ICD-10-CM | POA: Diagnosis not present

## 2017-08-06 DIAGNOSIS — M797 Fibromyalgia: Secondary | ICD-10-CM | POA: Diagnosis not present

## 2017-08-06 DIAGNOSIS — M47892 Other spondylosis, cervical region: Secondary | ICD-10-CM | POA: Diagnosis not present

## 2017-08-06 DIAGNOSIS — G894 Chronic pain syndrome: Secondary | ICD-10-CM | POA: Insufficient documentation

## 2017-08-06 DIAGNOSIS — F329 Major depressive disorder, single episode, unspecified: Secondary | ICD-10-CM | POA: Insufficient documentation

## 2017-08-06 DIAGNOSIS — Z720 Tobacco use: Secondary | ICD-10-CM | POA: Insufficient documentation

## 2017-08-06 DIAGNOSIS — Z5181 Encounter for therapeutic drug level monitoring: Secondary | ICD-10-CM | POA: Diagnosis not present

## 2017-08-06 DIAGNOSIS — M542 Cervicalgia: Secondary | ICD-10-CM | POA: Diagnosis not present

## 2017-08-06 DIAGNOSIS — M546 Pain in thoracic spine: Secondary | ICD-10-CM | POA: Diagnosis not present

## 2017-08-06 DIAGNOSIS — F1320 Sedative, hypnotic or anxiolytic dependence, uncomplicated: Secondary | ICD-10-CM | POA: Diagnosis not present

## 2017-08-06 DIAGNOSIS — Z79891 Long term (current) use of opiate analgesic: Secondary | ICD-10-CM | POA: Diagnosis not present

## 2017-08-06 DIAGNOSIS — R531 Weakness: Secondary | ICD-10-CM | POA: Diagnosis not present

## 2017-08-06 DIAGNOSIS — M5412 Radiculopathy, cervical region: Secondary | ICD-10-CM | POA: Insufficient documentation

## 2017-08-06 DIAGNOSIS — M545 Low back pain: Secondary | ICD-10-CM | POA: Diagnosis not present

## 2017-08-06 DIAGNOSIS — M25551 Pain in right hip: Secondary | ICD-10-CM | POA: Insufficient documentation

## 2017-08-06 MED ORDER — MELOXICAM 7.5 MG PO TABS: 7.5000 mg | ORAL_TABLET | Freq: Two times a day (BID) | ORAL | 5 refills | Status: DC

## 2017-08-06 MED ORDER — PREGABALIN 25 MG PO CAPS: 25.0000 mg | ORAL_CAPSULE | Freq: Every day | ORAL | 5 refills | Status: DC

## 2017-08-06 NOTE — Patient Instructions (Addendum)
____________________________________________________________________________________________  Preparing for Procedure with Sedation Instructions: . Oral Intake: Do not eat or drink anything for at least 8 hours prior to your procedure. . Transportation: Public transportation is not allowed. Bring an adult driver. The driver must be physically present in our waiting room before any procedure can be started. . Physical Assistance: Bring an adult physically capable of assisting you, in the event you need help. This adult should keep you company at home for at least 6 hours after the procedure. . Blood Pressure Medicine: Take your blood pressure medicine with a sip of water the morning of the procedure. . Blood thinners:  . Diabetics on insulin: Notify the staff so that you can be scheduled 1st case in the morning. If your diabetes requires high dose insulin, take only  of your normal insulin dose the morning of the procedure and notify the staff that you have done so. . Preventing infections: Shower with an antibacterial soap the morning of your procedure. . Build-up your immune system: Take 1000 mg of Vitamin C with every meal (3 times a day) the day prior to your procedure. . Antibiotics: Inform the staff if you have a condition or reason that requires you to take antibiotics before dental procedures. . Pregnancy: If you are pregnant, call and cancel the procedure. . Sickness: If you have a cold, fever, or any active infections, call and cancel the procedure. . Arrival: You must be in the facility at least 30 minutes prior to your scheduled procedure. . Children: Do not bring children with you. . Dress appropriately: Bring dark clothing that you would not mind if they get stained. . Valuables: Do not bring any jewelry or valuables. Procedure appointments are reserved for interventional treatments only. . No Prescription Refills. . No medication changes will be discussed during procedure  appointments. . No disability issues will be discussed. ____________________________________________________________________________________________  GENERAL RISKS AND COMPLICATIONS  What are the risk, side effects and possible complications? Generally speaking, most procedures are safe.  However, with any procedure there are risks, side effects, and the possibility of complications.  The risks and complications are dependent upon the sites that are lesioned, or the type of nerve block to be performed.  The closer the procedure is to the spine, the more serious the risks are.  Great care is taken when placing the radio frequency needles, block needles or lesioning probes, but sometimes complications can occur. 1. Infection: Any time there is an injection through the skin, there is a risk of infection.  This is why sterile conditions are used for these blocks.  There are four possible types of infection. 1. Localized skin infection. 2. Central Nervous System Infection-This can be in the form of Meningitis, which can be deadly. 3. Epidural Infections-This can be in the form of an epidural abscess, which can cause pressure inside of the spine, causing compression of the spinal cord with subsequent paralysis. This would require an emergency surgery to decompress, and there are no guarantees that the patient would recover from the paralysis. 4. Discitis-This is an infection of the intervertebral discs.  It occurs in about 1% of discography procedures.  It is difficult to treat and it may lead to surgery.        2. Pain: the needles have to go through skin and soft tissues, will cause soreness.       3. Damage to internal structures:  The nerves to be lesioned may be near blood vessels or      other nerves which can be potentially damaged.       4. Bleeding: Bleeding is more common if the patient is taking blood thinners such as  aspirin, Coumadin, Ticiid, Plavix, etc., or if he/she have some genetic  predisposition  such as hemophilia. Bleeding into the spinal canal can cause compression of the spinal  cord with subsequent paralysis.  This would require an emergency surgery to  decompress and there are no guarantees that the patient would recover from the  paralysis.       5. Pneumothorax:  Puncturing of a lung is a possibility, every time a needle is introduced in  the area of the chest or upper back.  Pneumothorax refers to free air around the  collapsed lung(s), inside of the thoracic cavity (chest cavity).  Another two possible  complications related to a similar event would include: Hemothorax and Chylothorax.   These are variations of the Pneumothorax, where instead of air around the collapsed  lung(s), you may have blood or chyle, respectively.       6. Spinal headaches: They may occur with any procedures in the area of the spine.       7. Persistent CSF (Cerebro-Spinal Fluid) leakage: This is a rare problem, but may occur  with prolonged intrathecal or epidural catheters either due to the formation of a fistulous  track or a dural tear.       8. Nerve damage: By working so close to the spinal cord, there is always a possibility of  nerve damage, which could be as serious as a permanent spinal cord injury with  paralysis.       9. Death:  Although rare, severe deadly allergic reactions known as "Anaphylactic  reaction" can occur to any of the medications used.      10. Worsening of the symptoms:  We can always make thing worse.  What are the chances of something like this happening? Chances of any of this occuring are extremely low.  By statistics, you have more of a chance of getting killed in a motor vehicle accident: while driving to the hospital than any of the above occurring .  Nevertheless, you should be aware that they are possibilities.  In general, it is similar to taking a shower.  Everybody knows that you can slip, hit your head and get killed.  Does that mean that you should not  shower again?  Nevertheless always keep in mind that statistics do not mean anything if you happen to be on the wrong side of them.  Even if a procedure has a 1 (one) in a 1,000,000 (million) chance of going wrong, it you happen to be that one..Also, keep in mind that by statistics, you have more of a chance of having something go wrong when taking medications.  Who should not have this procedure? If you are on a blood thinning medication (e.g. Coumadin, Plavix, see list of "Blood Thinners"), or if you have an active infection going on, you should not have the procedure.  If you are taking any blood thinners, please inform your physician.  How should I prepare for this procedure?  Do not eat or drink anything at least six hours prior to the procedure.  Bring a driver with you .  It cannot be a taxi.  Come accompanied by an adult that can drive you back, and that is strong enough to help you if your legs get weak or numb from the local anesthetic.  Take   all of your medicines the morning of the procedure with just enough water to swallow them.  If you have diabetes, make sure that you are scheduled to have your procedure done first thing in the morning, whenever possible.  If you have diabetes, take only half of your insulin dose and notify our nurse that you have done so as soon as you arrive at the clinic.  If you are diabetic, but only take blood sugar pills (oral hypoglycemic), then do not take them on the morning of your procedure.  You may take them after you have had the procedure.  Do not take aspirin or any aspirin-containing medications, at least eleven (11) days prior to the procedure.  They may prolong bleeding.  Wear loose fitting clothing that may be easy to take off and that you would not mind if it got stained with Betadine or blood.  Do not wear any jewelry or perfume  Remove any nail coloring.  It will interfere with some of our monitoring equipment.  NOTE: Remember that  this is not meant to be interpreted as a complete list of all possible complications.  Unforeseen problems may occur.  BLOOD THINNERS The following drugs contain aspirin or other products, which can cause increased bleeding during surgery and should not be taken for 2 weeks prior to and 1 week after surgery.  If you should need take something for relief of minor pain, you may take acetaminophen which is found in Tylenol,m Datril, Anacin-3 and Panadol. It is not blood thinner. The products listed below are.  Do not take any of the products listed below in addition to any listed on your instruction sheet.  A.P.C or A.P.C with Codeine Codeine Phosphate Capsules #3 Ibuprofen Ridaura  ABC compound Congesprin Imuran rimadil  Advil Cope Indocin Robaxisal  Alka-Seltzer Effervescent Pain Reliever and Antacid Coricidin or Coricidin-D  Indomethacin Rufen  Alka-Seltzer plus Cold Medicine Cosprin Ketoprofen S-A-C Tablets  Anacin Analgesic Tablets or Capsules Coumadin Korlgesic Salflex  Anacin Extra Strength Analgesic tablets or capsules CP-2 Tablets Lanoril Salicylate  Anaprox Cuprimine Capsules Levenox Salocol  Anexsia-D Dalteparin Magan Salsalate  Anodynos Darvon compound Magnesium Salicylate Sine-off  Ansaid Dasin Capsules Magsal Sodium Salicylate  Anturane Depen Capsules Marnal Soma  APF Arthritis pain formula Dewitt's Pills Measurin Stanback  Argesic Dia-Gesic Meclofenamic Sulfinpyrazone  Arthritis Bayer Timed Release Aspirin Diclofenac Meclomen Sulindac  Arthritis pain formula Anacin Dicumarol Medipren Supac  Analgesic (Safety coated) Arthralgen Diffunasal Mefanamic Suprofen  Arthritis Strength Bufferin Dihydrocodeine Mepro Compound Suprol  Arthropan liquid Dopirydamole Methcarbomol with Aspirin Synalgos  ASA tablets/Enseals Disalcid Micrainin Tagament  Ascriptin Doan's Midol Talwin  Ascriptin A/D Dolene Mobidin Tanderil  Ascriptin Extra Strength Dolobid Moblgesic Ticlid  Ascriptin with Codeine  Doloprin or Doloprin with Codeine Momentum Tolectin  Asperbuf Duoprin Mono-gesic Trendar  Aspergum Duradyne Motrin or Motrin IB Triminicin  Aspirin plain, buffered or enteric coated Durasal Myochrisine Trigesic  Aspirin Suppositories Easprin Nalfon Trillsate  Aspirin with Codeine Ecotrin Regular or Extra Strength Naprosyn Uracel  Atromid-S Efficin Naproxen Ursinus  Auranofin Capsules Elmiron Neocylate Vanquish  Axotal Emagrin Norgesic Verin  Azathioprine Empirin or Empirin with Codeine Normiflo Vitamin E  Azolid Emprazil Nuprin Voltaren  Bayer Aspirin plain, buffered or children's or timed BC Tablets or powders Encaprin Orgaran Warfarin Sodium  Buff-a-Comp Enoxaparin Orudis Zorpin  Buff-a-Comp with Codeine Equegesic Os-Cal-Gesic   Buffaprin Excedrin plain, buffered or Extra Strength Oxalid   Bufferin Arthritis Strength Feldene Oxphenbutazone   Bufferin plain or Extra Strength Feldene Capsules   Oxycodone with Aspirin   Bufferin with Codeine Fenoprofen Fenoprofen Pabalate or Pabalate-SF   Buffets II Flogesic Panagesic   Buffinol plain or Extra Strength Florinal or Florinal with Codeine Panwarfarin   Buf-Tabs Flurbiprofen Penicillamine   Butalbital Compound Four-way cold tablets Penicillin   Butazolidin Fragmin Pepto-Bismol   Carbenicillin Geminisyn Percodan   Carna Arthritis Reliever Geopen Persantine   Carprofen Gold's salt Persistin   Chloramphenicol Goody's Phenylbutazone   Chloromycetin Haltrain Piroxlcam   Clmetidine heparin Plaquenil   Cllnoril Hyco-pap Ponstel   Clofibrate Hydroxy chloroquine Propoxyphen         Before stopping any of these medications, be sure to consult the physician who ordered them.  Some, such as Coumadin (Warfarin) are ordered to prevent or treat serious conditions such as "deep thrombosis", "pumonary embolisms", and other heart problems.  The amount of time that you may need off of the medication may also vary with the medication and the reason for which  you were taking it.  If you are taking any of these medications, please make sure you notify your pain physician before you undergo any procedures.         Facet Blocks Patient Information  Description: The facets are joints in the spine between the vertebrae.  Like any joints in the body, facets can become irritated and painful.  Arthritis can also effect the facets.  By injecting steroids and local anesthetic in and around these joints, we can temporarily block the nerve supply to them.  Steroids act directly on irritated nerves and tissues to reduce selling and inflammation which often leads to decreased pain.  Facet blocks may be done anywhere along the spine from the neck to the low back depending upon the location of your pain.   After numbing the skin with local anesthetic (like Novocaine), a small needle is passed onto the facet joints under x-ray guidance.  You may experience a sensation of pressure while this is being done.  The entire block usually lasts about 15-25 minutes.   Conditions which may be treated by facet blocks:   Low back/buttock pain  Neck/shoulder pain  Certain types of headaches  Preparation for the injection:  1. Do not eat any solid food or dairy products within 8 hours of your appointment. 2. You may drink clear liquid up to 3 hours before appointment.  Clear liquids include water, black coffee, juice or soda.  No milk or cream please. 3. You may take your regular medication, including pain medications, with a sip of water before your appointment.  Diabetics should hold regular insulin (if taken separately) and take 1/2 normal NPH dose the morning of the procedure.  Carry some sugar containing items with you to your appointment. 4. A driver must accompany you and be prepared to drive you home after your procedure. 5. Bring all your current medications with you. 6. An IV may be inserted and sedation may be given at the discretion of the physician. 7. A  blood pressure cuff, EKG and other monitors will often be applied during the procedure.  Some patients may need to have extra oxygen administered for a short period. 8. You will be asked to provide medical information, including your allergies and medications, prior to the procedure.  We must know immediately if you are taking blood thinners (like Coumadin/Warfarin) or if you are allergic to IV iodine contrast (dye).  We must know if you could possible be pregnant.  Possible side-effects:   Bleeding from needle site    Infection (rare, may require surgery)  Nerve injury (rare)  Numbness & tingling (temporary)  Difficulty urinating (rare, temporary)  Spinal headache (a headache worse with upright posture)  Light-headedness (temporary)  Pain at injection site (serveral days)  Decreased blood pressure (rare, temporary)  Weakness in arm/leg (temporary)  Pressure sensation in back/neck (temporary)   Call if you experience:   Fever/chills associated with headache or increased back/neck pain  Headache worsened by an upright position  New onset, weakness or numbness of an extremity below the injection site  Hives or difficulty breathing (go to the emergency room)  Inflammation or drainage at the injection site(s)  Severe back/neck pain greater than usual  New symptoms which are concerning to you  Please note:  Although the local anesthetic injected can often make your back or neck feel good for several hours after the injection, the pain will likely return. It takes 3-7 days for steroids to work.  You may not notice any pain relief for at least one week.  If effective, we will often do a series of 2-3 injections spaced 3-6 weeks apart to maximally decrease your pain.  After the initial series, you may be a candidate for a more permanent nerve block of the facets.  If you have any questions, please call [phone] [facility] 

## 2017-08-06 NOTE — Progress Notes (Signed)
Safety precautions to be maintained throughout the outpatient stay will include: orient to surroundings, keep bed in low position, maintain call bell within reach at all times, provide assistance with transfer out of bed and ambulation.  

## 2017-08-06 NOTE — Progress Notes (Signed)
Patient's Name: Teresa [name]  MRN: [id number]  Referring Provider: Suzanna Obey, MD  DOB: 1961/03/22  PCP: Suzanna Obey, MD  DOS: 08/06/2017  Note by: Gaspar Cola, MD  Service setting: Ambulatory outpatient  Specialty: Interventional Pain Management  Location: [facility] (AMB) Pain Management Facility    Patient type: Established   Primary Reason(s) for Visit: Encounter for post-procedure evaluation of chronic illness with mild to moderate exacerbation CC: Back Pain (mid to low)  HPI  Teresa [name] is a 56 y.o. year old, female patient, who comes today for a post-procedure evaluation. She has History of panic attacks; Encounter for therapeutic drug level monitoring; Long term current use of opiate analgesic; Benzodiazepine dependence ([place]); Uncomplicated opioid dependence ([place]); Opiate use; Chronic pain syndrome (WC); Chronic upper back pain (WC); Thoracic back pain (WC); Hand pain; H/O cervical spine surgery; Chronic knee pain (Bilateral); Chronic low back pain; Cervical spondylosis (WC); Generalized anxiety disorder; Cervical facet syndrome (WC) (Bilateral) (L>R); Diffuse arthralgia; Tobacco use disorder; Fibromyalgia;  History of anterior cervical discectomy and fusion (ACDF) (left side)  (WC); Cervical radicular pain (WC) (1ry) (Left); Long term prescription opiate use; Encounter for chronic pain management; 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 Her primarily concern today is the Back Pain (mid to low)  Pain  Assessment: Location: Mid, Lower, Left Back Radiating: radiates to left hip Onset: More than a month ago Duration: Chronic pain Quality: Aching, Constant, Radiating, Sharp, Tender Severity: 2 /10 (self-reported pain score)  Note: Reported level is compatible with observation.                   Effect on ADL:   Timing: Constant Modifying factors: denies  Teresa [name] comes in today for post-procedure evaluation after the treatment done on 07/10/2017.  Further details on both, my assessment(s), as well as the proposed treatment plan, please see below.  Post-Procedure Assessment  07/10/2017 Procedure: Diagnostic bilateral cervical facet block under fluoroscopic guidance and IV sedation Pre-procedure pain score:  3/10 Post-procedure pain score: 0/10 (100% relief) Influential Factors: BMI: 22.71 kg/m Intra-procedural challenges: None observed.         Assessment challenges: None detected.              Reported side-effects: None.        Post-procedural adverse reactions or complications: None reported         Sedation: Please see nurses note. When no sedatives are used, the analgesic levels obtained are directly associated to the effectiveness of the local anesthetics. However, when sedation is provided, the level of analgesia obtained during the initial 1 hour following the intervention, is believed to be the result of a combination of factors. These factors may include, but are not limited to: 1. The effectiveness of the local anesthetics used. 2. The effects of the analgesic(s) and/or anxiolytic(s) used. 3. The degree of discomfort experienced by the patient at the time of the procedure. 4. The patients ability and reliability in recalling and recording the events. 5. The presence and influence of possible secondary gains and/or psychosocial factors. Reported result: Relief experienced  during the 1st hour after the procedure: 100 % (Ultra-Short Term Relief)            Interpretative  annotation: Clinically appropriate result. Analgesia during this period is likely to be Local Anesthetic and/or IV Sedative (Analgesic/Anxiolytic) related.          Effects of local anesthetic: The analgesic effects attained during this period are directly associated to the localized infiltration of local anesthetics and therefore cary significant diagnostic value as to the etiological location, or anatomical origin, of the pain. Expected duration of relief is directly dependent on the pharmacodynamics of the local anesthetic used. Long-acting (4-6 hours) anesthetics used.  Reported result: Relief during the next 4 to 6 hour after the procedure: 100 % (Short-Term Relief)            Interpretative annotation: Clinically appropriate result. Analgesia during this period is likely to be Local Anesthetic-related.          Long-term benefit: Defined as the period of time past the expected duration of local anesthetics (1 hour for short-acting and 4-6 hours for long-acting). With the possible exception of prolonged sympathetic blockade from the local anesthetics, benefits during this period are typically attributed to, or associated with, other factors such as analgesic sensory neuropraxia, antiinflammatory effects, or beneficial biochemical changes provided by agents other than the local anesthetics.  Reported result: Extended relief following procedure: 75 % (lasting a month, starting to come back now.) (Long-Term Relief)            Interpretative annotation: Clinically appropriate result. Good relief. No permanent benefit expected. Inflammation plays a part in the etiology to the pain.          Current benefits: Defined as persistent relief that continues at this point in time.   Reported results: Treated area: 75 % Ms. Tanton reports improvement in function. The patient indicates that she has not had a recurrence of the headaches since we did the diagnostic block. Interpretative annotation: Recurrence of  symptoms. No permanent benefit expected. Effective diagnostic intervention.          Interpretation: Results would suggest a successful diagnostic intervention.                  Plan:  Please see "Plan of Care" for details.  Laboratory Chemistry  Inflammation Markers (CRP: Acute Phase) (ESR: Chronic Phase) Lab Results  Component Value Date   CRP <0.8 06/12/2017   ESRSEDRATE 6 06/12/2017                 Renal Function Markers Lab Results  Component Value Date   BUN 10 06/12/2017   CREATININE 0.71 06/12/2017   GFRAA >60 06/12/2017   GFRNONAA >60 06/12/2017                 Hepatic Function Markers Lab Results  Component Value Date   AST 21 06/12/2017   ALT 18 06/12/2017   ALBUMIN 4.5 06/12/2017   ALKPHOS 54 06/12/2017                 Electrolytes Lab Results  Component Value Date   NA 140 06/12/2017   K 4.2 06/12/2017   CL 106 06/12/2017   CALCIUM 9.6 06/12/2017   MG 1.9 06/12/2017                 Neuropathy Markers No results found for: [id number]               Bone Pathology Markers  Lab Results  Component Value Date   ALKPHOS 54 06/12/2017   VD25OH 22.2 (L) 06/12/2017   CALCIUM 9.6 06/12/2017                 Coagulation Parameters No results found for: INR, LABPROT, APTT, PLT               Cardiovascular Markers No results found for: BNP, HGB, HCT               Note: Lab results reviewed.  Recent Diagnostic Imaging Review  Dg C-arm 1-60 Min-no Report  Result Date: 07/10/2017 Fluoroscopy was utilized by the requesting physician.  No radiographic interpretation.   Note: Imaging results reviewed.          Meds   Current Meds  Medication Sig  . alprazolam (XANAX) 2 MG tablet Take 2 mg by mouth 2 (two) times daily as needed.   . cyclobenzaprine (FLEXERIL) 5 MG tablet Take 1 tablet (5 mg total) by mouth 2 (two) times daily as needed for muscle spasms.  Marland Kitchen FLUoxetine (PROZAC) 20 MG tablet Take 20 mg by mouth daily.  . [DISCONTINUED] meloxicam (MOBIC)  15 MG tablet Take 1 tablet (15 mg total) by mouth daily.    ROS  Constitutional: Denies any fever or chills Gastrointestinal: No reported hemesis, hematochezia, vomiting, or acute GI distress Musculoskeletal: Denies any acute onset joint swelling, redness, loss of ROM, or weakness Neurological: No reported episodes of acute onset apraxia, aphasia, dysarthria, agnosia, amnesia, paralysis, loss of coordination, or loss of consciousness  Allergies  Ms. Longhi has No Known Allergies.  Rye  Drug: Ms. Corralejo  reports that she does not use drugs. Alcohol:  reports that she does not drink alcohol. Tobacco:  reports that she has been smoking.  She has never used smokeless tobacco. Medical:  has a past medical history of  History of anterior cervical discectomy and fusion (ACDF) (left side). (10/18/2015); Bilateral hip pain (10/18/2015); Cervical spine pain (WC) (10/18/2015); Cervical spondylosis with myelopathy; Chronic cervical radiculopathy (WC) (10/18/2015); Chronic constipation; Chronic pain syndrome; Depression; Fibromyalgia; GERD (gastroesophageal reflux disease); Insomnia; Migraine; Neck pain; and Panic attack. Surgical: Ms. Canion  has a past surgical history that includes Neck surgery and btl. Family: family history is not on file.  Constitutional Exam  General appearance: Well nourished, well developed, and well hydrated. In no apparent acute distress Vitals:   08/06/17 0919  BP: 119/73  Pulse: 79  Resp: 16  Temp: 98.2 F (36.8 C)  SpO2: 100%  Weight: 145 lb (65.8 kg)  Height: '5\' 7"'$  (1.702 m)   BMI Assessment: Estimated body mass index is 22.71 kg/m as calculated from the following:   Height as of this encounter: '5\' 7"'$  (1.702 m).   Weight as of this encounter: 145 lb (65.8 kg).  BMI interpretation table: BMI level Category Range association with higher incidence of chronic pain  <18 kg/m2 Underweight   18.5-24.9 kg/m2 Ideal body weight   25-29.9 kg/m2 Overweight Increased  incidence by 20%  30-34.9 kg/m2 Obese (Class I) Increased incidence by 68%  35-39.9 kg/m2 Severe obesity (Class II) Increased incidence by 136%  >40 kg/m2 Extreme obesity (Class III) Increased incidence by 254%   BMI Readings from Last 4 Encounters:  08/06/17 22.71 kg/m  07/10/17 22.71 kg/m  06/27/17 22.71 kg/m  04/04/17 21.93 kg/m   Wt Readings from Last 4 Encounters:  08/06/17 145 lb (65.8 kg)  07/10/17 145 lb (65.8 kg)  06/27/17 145 lb (65.8 kg)  04/04/17 140 lb (63.5 kg)  Psych/Mental status: Alert, oriented x 3 (person, place, & time)       Eyes: PERLA Respiratory: No evidence of acute respiratory distress  Cervical Spine Area Exam  Skin & Axial Inspection: No masses, redness, edema, swelling, or associated skin lesions Alignment: Symmetrical Functional ROM: Unrestricted ROM      Stability: No instability detected Muscle Tone/Strength: Functionally intact. No obvious neuro-muscular anomalies detected. Sensory (Neurological): Unimpaired Palpation: No palpable anomalies              Upper Extremity (UE) Exam    Side: Right upper extremity  Side: Left upper extremity  Skin & Extremity Inspection: Skin color, temperature, and hair growth are WNL. No peripheral edema or cyanosis. No masses, redness, swelling, asymmetry, or associated skin lesions. No contractures.  Skin & Extremity Inspection: Skin color, temperature, and hair growth are WNL. No peripheral edema or cyanosis. No masses, redness, swelling, asymmetry, or associated skin lesions. No contractures.  Functional ROM: Unrestricted ROM          Functional ROM: Unrestricted ROM          Muscle Tone/Strength: Functionally intact. No obvious neuro-muscular anomalies detected.  Muscle Tone/Strength: Functionally intact. No obvious neuro-muscular anomalies detected.  Sensory (Neurological): Unimpaired  Sensory (Neurological): Unimpaired  Palpation: No palpable anomalies              Palpation: No palpable anomalies               Specialized Test(s): Deferred         Specialized Test(s): Deferred          Thoracic Spine Area Exam  Skin & Axial Inspection: No masses, redness, or swelling Alignment: Symmetrical Functional ROM: Unrestricted ROM Stability: No instability detected Muscle Tone/Strength: Functionally intact. No obvious neuro-muscular anomalies detected. Sensory (Neurological): Unimpaired Muscle strength & Tone: No palpable anomalies  Lumbar Spine Area Exam  Skin & Axial Inspection: No masses, redness, or swelling Alignment: Symmetrical Functional ROM: Unrestricted ROM      Stability: No instability detected Muscle Tone/Strength: Functionally intact. No obvious neuro-muscular anomalies detected. Sensory (Neurological): Unimpaired Palpation: No palpable anomalies       Provocative Tests: Lumbar Hyperextension and rotation test: evaluation deferred today       Lumbar Lateral bending test: evaluation deferred today       Patrick's Maneuver: evaluation deferred today                    Gait & Posture Assessment  Ambulation: Unassisted Gait: Relatively normal for age and body habitus Posture: WNL   Lower Extremity Exam    Side: Right lower extremity  Side: Left lower extremity  Skin & Extremity Inspection: Skin color, temperature, and hair growth are WNL. No peripheral edema or cyanosis. No masses, redness, swelling, asymmetry, or associated skin lesions. No contractures.  Skin & Extremity Inspection: Skin color, temperature, and hair growth are WNL. No peripheral edema or cyanosis. No masses, redness, swelling, asymmetry, or associated skin lesions. No contractures.  Functional ROM: Unrestricted ROM          Functional ROM: Unrestricted ROM          Muscle Tone/Strength: Functionally intact. No obvious neuro-muscular anomalies detected.  Muscle Tone/Strength: Functionally intact. No obvious neuro-muscular anomalies detected.  Sensory (Neurological): Unimpaired  Sensory (Neurological): Unimpaired   Palpation: No palpable anomalies  Palpation: No palpable anomalies   Assessment  Primary Diagnosis & Pertinent Problem List: The primary encounter diagnosis  was Cervical facet syndrome (WC) (Bilateral) (L>R). Diagnoses of Cervical spondylosis (WC), Chronic neck pain (WC) (2ry) (Bilateral) (L>R), and Fibromyalgia were also pertinent to this visit.  Status Diagnosis  Controlled Controlled Controlled 1. Cervical facet syndrome (WC) (Bilateral) (L>R)   2. Cervical spondylosis (WC)   3. Chronic neck pain (WC) (2ry) (Bilateral) (L>R)   4. Fibromyalgia     Problems updated and reviewed during this visit: No problems updated. Plan of Care  Pharmacotherapy (Medications Ordered): Meds ordered this encounter  Medications  . meloxicam (MOBIC) 7.5 MG tablet    Sig: Take 1 tablet (7.5 mg total) by mouth 2 (two) times daily.    Dispense:  60 tablet    Refill:  5    Do not place medication on "Automatic Refill". Fill one day early if pharmacy is closed on scheduled refill date.  . pregabalin (LYRICA) 25 MG capsule    Sig: Take 1 capsule (25 mg total) by mouth at bedtime.    Dispense:  30 capsule    Refill:  5    Do not place this medication, or any other prescription from our practice, on "Automatic Refill". Patient may have prescription filled one day early if pharmacy is closed on scheduled refill date.   New Prescriptions   MELOXICAM (MOBIC) 7.5 MG TABLET    Take 1 tablet (7.5 mg total) by mouth 2 (two) times daily.   PREGABALIN (LYRICA) 25 MG CAPSULE    Take 1 capsule (25 mg total) by mouth at bedtime.   Medications administered today: Ms. Sanborn had no medications administered during this visit.   Procedure Orders     CERVICAL FACET (MEDIAL BRANCH NERVE BLOCK)  Lab Orders  No laboratory test(s) ordered today   Imaging Orders  No imaging studies ordered today   Referral Orders  No referral(s) requested today    Interventional management options: Planned, scheduled, and/or  pending:   Diagnostic bilateral Cervical FacetBlock #2 Left CESI under fluoro and IV sedation.   Considering:   Diagnostic bilateral Cervical FacetBlock #2 Possible bilateral cervical facet RFA Palliative Left CESI under fluoro and IV sedation.   Palliative PRN treatment(s):   Palliative left cervical epidural steroid injection under fluoroscopy and IV sedation    Provider-requested follow-up: Return for Procedure (with sedation): C-FCT BLK #2.  No future appointments. Primary Care Physician: Suzanna Obey, MD Location: [facility] Note by: Gaspar Cola, MD Date: 08/06/2017; Time: 10:52 AM

## 2017-09-05 ENCOUNTER — Ambulatory Visit: Payer: Self-pay | Admitting: Adult Health Nurse Practitioner

## 2017-09-05 VITALS — BP 137/85 | HR 70 | Ht 67.0 in | Wt 143.3 lb

## 2017-09-05 DIAGNOSIS — Z0000 Encounter for general adult medical examination without abnormal findings: Secondary | ICD-10-CM

## 2017-09-05 NOTE — Progress Notes (Signed)
Subjective:    Patient ID: Teresa [name], female    DOB: 12/28/1961, 56 y.o.   MRN: [id number]  HPI   [facility]   Pt prescribed XANAX from prev provider for chronic pain. Pt is going to [facility].  Pt prescribed PROZAC from prev provider for anxiety and depression. Pt reports she feels it is well controlled and no intentions to harm herself or others.  Pt has never had mammo. Pt reports pap was awhile ago.       Patient Active Problem List   Diagnosis Date Noted  . Abnormal MRI, cervical spine (04/16/2017) 07/02/2017  . Weakness of left leg 06/27/2017  . Depression 04/04/2017  . Disturbance of skin sensation (Left side of face) 02/13/2017  . Muscle spasms of lower extremity 09/19/2016  . Chronic hip pain (Bilateral) 02/01/2016  . Chronic pain of both shoulders (2ry) (B) (L>R) 02/01/2016  . Abnormal UDS (12/01/2015) 12/12/2015  . Work related injury (DOI: 07/20/2010) 12/05/2015  . Neurogenic pain 12/05/2015  . Neuropathic pain 12/05/2015  . Long term prescription opiate use 12/01/2015  . Encounter for chronic pain management 12/01/2015  . Failed cervical surgery syndrome (ACDF by Dr. Noel Geroldohen) [facility](WC) 12/01/2015  . Chronic neck pain (WC) (2ry) (Bilateral) (L>R) 12/01/2015  . Musculoskeletal pain 12/01/2015  . Muscle spasms of head or neck 12/01/2015  . History of panic attacks 10/18/2015  . Encounter for therapeutic drug level monitoring 10/18/2015  . Long term current use of opiate analgesic 10/18/2015  . Benzodiazepine dependence (H[facility]) 10/18/2015  . Uncomplicated opioid dependence (HCC) 10/18/2015  . Opiate use 10/18/2015  . Chronic pain syndrome (WC) 10/18/2015  . Chronic upper back pain (WC) 10/18/2015  . Thoracic back pain (WC) 10/18/2015  . Hand pain 10/18/2015  . H/O cervical spine surgery 10/18/2015  . Chronic knee pain (Bilateral) 10/18/2015  . Chronic low back pain 10/18/2015  . Cervical spondylosis (WC) 10/18/2015  . Generalized  anxiety disorder 10/18/2015  . Cervical facet syndrome (WC) (Bilateral) (L>R) 10/18/2015  . Diffuse arthralgia 10/18/2015  . Tobacco use disorder 10/18/2015  . Fibromyalgia 10/18/2015  .  History of anterior cervical discectomy and fusion (ACDF) (left side)  (WC) 10/18/2015  . Cervical radicular pain (WC) (1ry) (Left) 10/18/2015   Allergies as of 09/05/2017   No Known Allergies     Medication List       Accurate as of 09/05/17  7:13 PM. Always use your most recent med list.          alprazolam 2 MG tablet Commonly known as:  XANAX Take 2 mg by mouth 2 (two) times daily as needed.   cyclobenzaprine 5 MG tablet Commonly known as:  FLEXERIL Take 1 tablet (5 mg total) by mouth 2 (two) times daily as needed for muscle spasms.   FLUoxetine 20 MG tablet Commonly known as:  PROZAC Take 20 mg by mouth daily.   meloxicam 15 MG tablet Commonly known as:  MOBIC Take 15 mg by mouth daily.   pregabalin 25 MG capsule Commonly known as:  LYRICA Take 1 capsule (25 mg total) by mouth at bedtime.        Review of Systems  All other systems reviewed and are negative.    Objective:   Physical Exam  Constitutional: She is oriented to person, place, and time. She appears well-developed and well-nourished.  Cardiovascular: Normal rate, regular rhythm, normal heart sounds and intact distal pulses.   Pulmonary/Chest: Effort normal and breath  sounds normal.  Abdominal: Soft. Bowel sounds are normal.  Neurological: She is alert and oriented to person, place, and time.    BP 137/85 (BP Location: Left Arm, Patient Position: Sitting, Cuff Size: Normal)   Pulse 70   Ht  (1.702 m)   Wt 143 lb 4.8 oz (65 kg)   LMP 09/05/2014 (Within Years)   BMI 22.44 kg/m        Assessment & Plan:   Labs today: CBC, CMET, A1c, Lipid, TSH Referral for dental exam.  Referral for pap and mammogram at [facility] Continue to FU with pain management.   FU pending lab results.

## 2017-09-06 LAB — COMPREHENSIVE METABOLIC PANEL
ALBUMIN: 4.9 g/dL (ref 3.5–5.5)
ALK PHOS: 72 IU/L (ref 39–117)
ALT: 14 IU/L (ref 0–32)
AST: 17 IU/L (ref 0–40)
Albumin/Globulin Ratio: 2.1 (ref 1.2–2.2)
BUN / CREAT RATIO: 19 (ref 9–23)
BUN: 21 mg/dL (ref 6–24)
Bilirubin Total: 0.4 mg/dL (ref 0.0–1.2)
CALCIUM: 9.9 mg/dL (ref 8.7–10.2)
CO2: 22 mmol/L (ref 20–29)
CREATININE: 1.11 mg/dL — AB (ref 0.57–1.00)
Chloride: 101 mmol/L (ref 96–106)
GFR calc Af Amer: 64 mL/min/{1.73_m2} (ref >59)
GFR calc non Af Amer: 56 mL/min/{1.73_m2} — ABNORMAL LOW (ref >59)
GLUCOSE: 95 mg/dL (ref 65–99)
Globulin, Total: 2.3 g/dL (ref 1.5–4.5)
Potassium: 5.6 mmol/L — ABNORMAL HIGH (ref 3.5–5.2)
Sodium: 143 mmol/L (ref 134–144)
TOTAL PROTEIN: 7.2 g/dL (ref 6.0–8.5)

## 2017-09-06 LAB — HEMOGLOBIN A1C
Est. average glucose Bld gHb Est-mCnc: 103 mg/dL
Hgb A1c MFr Bld: 5.2 % (ref 4.8–5.6)

## 2017-09-06 LAB — CBC
Hematocrit: 41.9 % (ref 34.0–46.6)
Hemoglobin: 14.4 g/dL (ref 11.1–15.9)
MCH: 31.4 pg (ref 26.6–33.0)
MCHC: 34.4 g/dL (ref 31.5–35.7)
MCV: 92 fL (ref 79–97)
Platelets: 319 10*3/uL (ref 150–379)
RBC: 4.58 x10E6/uL (ref 3.77–5.28)
RDW: 13.4 % (ref 12.3–15.4)
WBC: 8.2 10*3/uL (ref 3.4–10.8)

## 2017-09-06 LAB — LIPID PANEL
CHOLESTEROL TOTAL: 224 mg/dL — AB (ref 100–199)
Chol/HDL Ratio: 3.9 ratio (ref 0.0–4.4)
HDL: 58 mg/dL (ref >39)
LDL CALC: 122 mg/dL — AB (ref 0–99)
TRIGLYCERIDES: 222 mg/dL — AB (ref 0–149)
VLDL CHOLESTEROL CAL: 44 mg/dL — AB (ref 5–40)

## 2017-09-06 LAB — TSH: TSH: 2.53 u[IU]/mL (ref 0.450–4.500)

## 2017-09-12 ENCOUNTER — Telehealth: Payer: Self-pay

## 2017-09-12 NOTE — Telephone Encounter (Signed)
-----   Message from Jacelyn Pieah Doles-Johnson, NP sent at 09/10/2017  5:54 PM EDT ----- Have patient come in for lab review.

## 2017-09-12 NOTE — Telephone Encounter (Signed)
Called pt to go over lab results. Made pt appt. PT verbalized understanding.

## 2017-09-19 ENCOUNTER — Ambulatory Visit: Payer: Self-pay | Admitting: Family Medicine

## 2017-09-19 VITALS — BP 138/86 | HR 85 | Temp 98.4°F | Wt 144.2 lb

## 2017-09-19 DIAGNOSIS — F17200 Nicotine dependence, unspecified, uncomplicated: Secondary | ICD-10-CM

## 2017-09-19 DIAGNOSIS — K219 Gastro-esophageal reflux disease without esophagitis: Secondary | ICD-10-CM

## 2017-09-19 DIAGNOSIS — M47812 Spondylosis without myelopathy or radiculopathy, cervical region: Secondary | ICD-10-CM

## 2017-09-19 NOTE — Progress Notes (Signed)
   Subjective:    Patient ID: Teresa [name], female    DOB: March 09, 1961, 56 y.o.   MRN: [id number]  HPI   Pt here for review of labs   Pt reports no problems with Lyrica    Pt reports attending pain management clinic    Review of Systems     Objective:   Physical Exam  Constitutional: She appears well-developed and well-nourished.  HENT:  Head: Normocephalic and atraumatic.  Poor dentition.  Cardiovascular: Normal rate, regular rhythm and normal heart sounds.   Skin: Skin is warm and dry.  Psychiatric: She has a normal mood and affect. Her behavior is normal. Judgment and thought content normal.    BP 138/86   [name] 85   Temp 98.4 F (36.9 C)   Wt 144 lb 3.2 oz (65.4 kg)   LMP 09/05/2014 (Within Years)   BMI 22.58 kg/m       Assessment & Plan:  Chronic Pain Follow up in 6 mo  Advised pt on dental referral   Advised pt not to receive flu shot due to egg intolerance   I have done the exam and reviewed the chart and it is accurate to the best of my knowledge. Dentist has been used and  any errors in dictation or transcription are unintentional. Sigmund [email or web address].D.

## 2017-09-20 DIAGNOSIS — K219 Gastro-esophageal reflux disease without esophagitis: Secondary | ICD-10-CM | POA: Insufficient documentation

## 2017-10-15 ENCOUNTER — Encounter: Payer: Self-pay | Admitting: Pain Medicine

## 2017-10-15 ENCOUNTER — Ambulatory Visit: Payer: Worker's Compensation | Attending: Pain Medicine | Admitting: Pain Medicine

## 2017-10-15 ENCOUNTER — Ambulatory Visit
Admission: RE | Admit: 2017-10-15 | Discharge: 2017-10-15 | Disposition: A | Discharge status: Home / Self Care | Payer: Worker's Compensation | Source: Ambulatory Visit | Attending: Pain Medicine | Admitting: Pain Medicine

## 2017-10-15 VITALS — BP 158/71 | HR 60 | Temp 96.6°F | Resp 12 | Ht 67.0 in | Wt 145.0 lb

## 2017-10-15 DIAGNOSIS — M47812 Spondylosis without myelopathy or radiculopathy, cervical region: Secondary | ICD-10-CM | POA: Insufficient documentation

## 2017-10-15 DIAGNOSIS — M542 Cervicalgia: Secondary | ICD-10-CM | POA: Diagnosis not present

## 2017-10-15 DIAGNOSIS — G8929 Other chronic pain: Secondary | ICD-10-CM | POA: Insufficient documentation

## 2017-10-15 DIAGNOSIS — Y990 Civilian activity done for income or pay: Secondary | ICD-10-CM | POA: Diagnosis not present

## 2017-10-15 MED ORDER — LACTATED RINGERS IV SOLN: 1000.0000 mL | Freq: Once | INTRAVENOUS | Status: DC

## 2017-10-15 MED ORDER — MIDAZOLAM HCL 5 MG/5ML IJ SOLN
1.0000 mg | INTRAMUSCULAR | Status: DC | PRN
  Administered 2017-10-15: 4 mg via INTRAVENOUS
  Filled 2017-10-15: qty 5

## 2017-10-15 MED ORDER — LIDOCAINE HCL 2 % IJ SOLN
10.0000 mL | Freq: Once | INTRAMUSCULAR | Status: AC
  Administered 2017-10-15: 400 mg
  Filled 2017-10-15: qty 20

## 2017-10-15 MED ORDER — ROPIVACAINE HCL 2 MG/ML IJ SOLN
9.0000 mL | Freq: Once | INTRAMUSCULAR | Status: AC
  Administered 2017-10-15: 10 mL via PERINEURAL
  Filled 2017-10-15: qty 10

## 2017-10-15 MED ORDER — FENTANYL CITRATE (PF) 100 MCG/2ML IJ SOLN
25.0000 ug | INTRAMUSCULAR | Status: DC | PRN
  Administered 2017-10-15: 100 ug via INTRAVENOUS
  Filled 2017-10-15: qty 2

## 2017-10-15 MED ORDER — DEXAMETHASONE SODIUM PHOSPHATE 10 MG/ML IJ SOLN
10.0000 mg | Freq: Once | INTRAMUSCULAR | Status: AC
  Administered 2017-10-15: 10 mg
  Filled 2017-10-15: qty 1

## 2017-10-15 NOTE — Progress Notes (Signed)
Safety precautions to be maintained throughout the outpatient stay will include: orient to surroundings, keep bed in low position, maintain call bell within reach at all times, provide assistance with transfer out of bed and ambulation.  

## 2017-10-15 NOTE — Patient Instructions (Addendum)
____________________________________________________________________________________________  Post-Procedure instructions Instructions:  Apply ice: Fill a plastic sandwich bag with crushed ice. Cover it with a small towel and apply to injection site. Apply for 15 minutes then remove x 15 minutes. Repeat sequence on day of procedure, until you go to bed. The purpose is to minimize swelling and discomfort after procedure.  Apply heat: Apply heat to procedure site starting the day following the procedure. The purpose is to treat any soreness and discomfort from the procedure.  Food intake: Start with clear liquids (like water) and advance to regular food, as tolerated.   Physical activities: Keep activities to a minimum for the first 8 hours after the procedure.   Driving: If you have received any sedation, you are not allowed to drive for 24 hours after your procedure.  Blood thinner: Restart your blood thinner 6 hours after your procedure. (Only for those taking blood thinners)  Insulin: As soon as you can eat, you may resume your normal dosing schedule. (Only for those taking insulin)  Infection prevention: Keep procedure site clean and dry.  Post-procedure Pain Diary: Extremely important that this be done correctly and accurately. Recorded information will be used to determine the next step in treatment.  Pain evaluated is that of treated area only. Do not include pain from an untreated area.  Complete every hour, on the hour, for the initial 8 hours. Set an alarm to help you do this part accurately.  Do not go to sleep and have it completed later. It will not be accurate.  Follow-up appointment: Keep your follow-up appointment after the procedure. Usually 2 weeks for most procedures. (6 weeks in the case of radiofrequency.) Bring you pain diary.  Expect:  From numbing medicine (AKA: Local Anesthetics): Numbness or decrease in pain.  Onset: Full effect within 15 minutes of  injected.  Duration: It will depend on the type of local anesthetic used. On the average, 1 to 8 hours.   From steroids: Decrease in swelling or inflammation. Once inflammation is improved, relief of the pain will follow.  Onset of benefits: Depends on the amount of swelling present. The more swelling, the longer it will take for the benefits to be seen. In some cases, up to 10 days.  Duration: Steroids will stay in the system x 2 weeks. Duration of benefits will depend on multiple posibilities including persistent irritating factors.  From procedure: Some discomfort is to be expected once the numbing medicine wears off. This should be minimal if ice and heat are applied as instructed. Call if:  You experience numbness and weakness that gets worse with time, as opposed to wearing off.  New onset bowel or bladder incontinence. (Spinal procedures only)  Emergency Numbers:  Durning business hours (Monday - Thursday, 8:00 AM - 4:00 PM) (Friday, 9:00 AM - 12:00 Noon): [phone]  After hours: [phone] ____________________________________________________________________________________________  Pain Management Discharge Instructions  General Discharge Instructions :  If you need to reach your doctor call: Monday-Friday 8:00 am - 4:00 pm at [phone] or toll free [phone].  After clinic hours [phone] to have operator reach doctor.  Bring all of your medication bottles to all your appointments in the pain clinic.  To cancel or reschedule your appointment with Pain Management please remember to call 24 hours in advance to avoid a fee.  Refer to the educational materials which you have been given on: General Risks, I had my Procedure. Discharge Instructions, Post Sedation.  Post Procedure Instructions:  The drugs you   were given will stay in your system until tomorrow, so for the next 24 hours you should not drive, make any legal decisions or drink any alcoholic  beverages.  You may eat anything you prefer, but it is better to start with liquids then soups and crackers, and gradually work up to solid foods.  Please notify your doctor immediately if you have any unusual bleeding, trouble breathing or pain that is not related to your normal pain.  Depending on the type of procedure that was done, some parts of your body may feel week and/or numb.  This usually clears up by tonight or the next day.  Walk with the use of an assistive device or accompanied by an adult for the 24 hours.  You may use ice on the affected area for the first 24 hours.  Put ice in a Ziploc bag and cover with a towel and place against area 15 minutes on 15 minutes off.  You may switch to heat after 24 hours. Facet Joint Block, Care After Refer to this sheet in the next few weeks. These instructions provide you with information about caring for yourself after your procedure. Your health care provider may also give you more specific instructions. Your treatment has been planned according to current medical practices, but problems sometimes occur. Call your health care provider if you have any problems or questions after your procedure. What can I expect after the procedure? After the procedure, it is common to have:  Some tenderness over the injection sites for 2 days after the procedure.  A temporary increase in blood sugar if you have diabetes.  Follow these instructions at home:  Keep track of the amount of pain relief you feel and how long it lasts.  Take over-the-counter and prescription medicines only as told by your health care provider. You may need to limit pain medicine within the first 4-6 hours after the procedure.  Remove your bandages (dressings) the morning after the procedure.  For the first 24 hours after the procedure: ? Do not apply heat near or over the injection sites. ? Do not take a bath or soak in water, such as in a pool or lake. ? Do not drive or  operate heavy machinery unless approved by your health care provider. ? Avoid activities that require a lot of energy.  If the injection site is tender, try applying ice to the area. To do this: ? Put ice in a plastic bag. ? Place a towel between your skin and the bag. ? Leave the ice on for 20 minutes, 2-3 times a day.  Keep all follow-up visits as told by your health care provider. This is important. Contact a health care provider if:  Fluid is coming from an injection site.  There is significant bleeding or swelling at an injection site.  You have diabetes and your blood sugar is above 180 mg/dL. Get help right away if:  You have a fever.  You have worsening pain or swelling around an injection site.  There are red streaks around an injection site.  You develop severe pain that is not controlled by your medicines.  You develop a headache, stiff neck, nausea, or vomiting.  Your eyes become very sensitive to light.  You have weakness, paralysis, or tingling in your arms or legs that was not present before the procedure.  You have difficulty urinating or breathing. This information is not intended to replace advice given to you by your health care   provider. Make sure you discuss any questions you have with your health care provider. Document Released: 12/03/2012 Document Revised: 05/02/2016 Document Reviewed: 09/12/2015 [employer] Interactive Patient Education  2018 [employer]  Facet Joint Block The facet joints connect the bones of the spine (vertebrae). They make it possible for you to bend, twist, and make other movements with your spine. They also keep you from bending too far, twisting too far, and making other excessive movements. A facet joint block is a procedure where a numbing medicine (anesthetic) is injected into a facet joint. Often, a type of anti-inflammatory medicine called a steroid is also injected. A facet joint block may be done to diagnose neck or back  pain. If the pain gets better after a facet joint block, it means the pain is probably coming from the facet joint. If the pain does not get better, it means the pain is probably not coming from the facet joint. A facet joint block may also be done to relieve neck or back pain caused by an inflamed facet joint. A facet joint block is only done to relieve pain if the pain does not improve with other methods, such as medicine, exercise programs, and physical therapy. Tell a health care provider about:  Any allergies you have.  All medicines you are taking, including vitamins, herbs, eye drops, creams, and over-the-counter medicines.  Any problems you or family members have had with anesthetic medicines.  Any blood disorders you have.  Any surgeries you have had.  Any medical conditions you have.  Whether you are pregnant or may be pregnant. What are the risks? Generally, this is a safe procedure. However, problems may occur, including:  Bleeding.  Injury to a nerve near the injection site.  Pain at the injection site.  Weakness or numbness in areas controlled by nerves near the injection site.  Infection.  Temporary fluid retention.  Allergic reactions to medicines or dyes.  Injury to other structures or organs near the injection site.  What happens before the procedure?  Follow instructions from your health care provider about eating or drinking restrictions.  Ask your health care provider about: ? Changing or stopping your regular medicines. This is especially important if you are taking diabetes medicines or blood thinners. ? Taking medicines such as aspirin and ibuprofen. These medicines can thin your blood. Do not take these medicines before your procedure if your health care provider instructs you not to.  Do not take any new dietary supplements or medicines without asking your health care provider first.  Plan to have someone take you home after the procedure. What  happens during the procedure?  You may need to remove your clothing and dress in an open-back gown.  The procedure will be done while you are lying on an X-ray table. You will most likely be asked to lie on your stomach, but you may be asked to lie in a different position if an injection will be made in your neck.  Machines will be used to monitor your oxygen levels, heart rate, and blood pressure.  If an injection will be made in your neck, an IV tube will be inserted into one of your veins. Fluids and medicine will flow directly into your body through the IV tube.  The area over the facet joint where the injection will be made will be cleaned with soap. The surrounding skin will be covered with clean drapes.  A numbing medicine (local anesthetic) will be applied to your skin.   Your skin may sting or burn for a moment.  A video X-ray machine (fluoroscopy) will be used to locate the joint. In some cases, a CT scan may be used.  A contrast dye may be injected into the facet joint area to help locate the joint.  When the joint is located, an anesthetic will be injected into the joint through the needle.  Your health care provider will ask you whether you feel pain relief. If you do feel relief, a steroid may be injected to provide pain relief for a longer period of time. If you do not feel relief or feel only partial relief, additional injections of an anesthetic may be made in other facet joints.  The needle will be removed.  Your skin will be cleaned.  A bandage (dressing) will be applied over each injection site. The procedure may vary among health care providers and hospitals. What happens after the procedure?  You will be observed for 15-30 minutes before being allowed to go home. This information is not intended to replace advice given to you by your health care provider. Make sure you discuss any questions you have with your health care provider. Document Released: 05/08/2007  Document Revised: 01/18/2016 Document Reviewed: 09/12/2015 [employer] Interactive Patient Education  2018 [employer]  

## 2017-10-15 NOTE — Progress Notes (Addendum)
Patient's Name: Teresa [name]  MRN: [id number]  Referring Provider: Lesli Albee, MD  DOB: Apr 03, 1961  PCP: Lesli Albee, MD  DOS: 10/15/2017  Note by: Oswaldo Done, MD  Service setting: Ambulatory outpatient  Specialty: Interventional Pain Management  Patient type: Established  Location: [facility] (AMB) Pain Management Facility  Visit type: Interventional Procedure   Primary Reason for Visit: Interventional Pain Management Treatment. CC: Neck Pain (both sides, worse on left side)  Procedure:  Anesthesia, Analgesia, Anxiolysis:  Type: Diagnostic Cervical Facet Medial Branch Block(s) #2 Region: Posterolateral cervical spine region Level: C3, C4, C5, C6, & C7 Medial Branch Level(s) Laterality: Bilateral Paraspinal  Type: Local Anesthesia with Moderate (Conscious) Sedation Local Anesthetic: Lidocaine 1% Route: Intravenous (IV) IV Access: Secured Sedation: Meaningful verbal contact was maintained at all times during the procedure  Indication(s): Analgesia and Anxiety   Indications: 1. Cervical facet syndrome (WC) (Bilateral) (L>R)   2. Cervical spondylosis (WC)   3. Chronic neck pain (WC) (2ry) (Bilateral) (L>R)   4. Work related injury (DOI: 07/20/2010)    Pain Score: Pre-procedure: 3 /10 Post-procedure: 0-No pain/10  Pre-op Assessment:  Teresa [name] is a 56 y.o. (year old), female patient, seen today for interventional treatment. She  has a past surgical history that includes Neck surgery; btl; and Tubal ligation (Bilateral). Teresa [name] has a current medication list which includes the following prescription(s): alprazolam, cyclobenzaprine, fluoxetine, meloxicam, and pregabalin, and the following Facility-Administered Medications: fentanyl, lactated ringers, and midazolam. Her primarily concern today is the Neck Pain (both sides, worse on left side)  Initial Vital Signs: Last menstrual period 09/05/2014. BMI: Estimated body mass index is 22.71 kg/m as calculated from the  following:   Height as of this encounter:  (1.702 m).   Weight as of this encounter: 145 lb (65.8 kg).  Risk Assessment: Allergies: Reviewed. She has No Known Allergies.  Allergy Precautions: None required Coagulopathies: Reviewed. None identified.  Blood-thinner therapy: None at this time Active Infection(s): Reviewed. None identified. Teresa [name] is afebrile  Site Confirmation: Teresa [name] was asked to confirm the procedure and laterality before marking the site Procedure checklist: Completed Consent: Before the procedure and under the influence of no sedative(s), amnesic(s), or anxiolytics, the patient was informed of the treatment options, risks and possible complications. To fulfill our ethical and legal obligations, as recommended by the American Medical Association's Code of Ethics, I have informed the patient of my clinical impression; the nature and purpose of the treatment or procedure; the risks, benefits, and possible complications of the intervention; the alternatives, including doing nothing; the risk(s) and benefit(s) of the alternative treatment(s) or procedure(s); and the risk(s) and benefit(s) of doing nothing. The patient was provided information about the general risks and possible complications associated with the procedure. These may include, but are not limited to: failure to achieve desired goals, infection, bleeding, organ or nerve damage, allergic reactions, paralysis, and death. In addition, the patient was informed of those risks and complications associated to Spine-related procedures, such as failure to decrease pain; infection (i.e.: Meningitis, epidural or intraspinal abscess); bleeding (i.e.: epidural hematoma, subarachnoid hemorrhage, or any other type of intraspinal or peri-dural bleeding); organ or nerve damage (i.e.: Any type of peripheral nerve, nerve root, or spinal cord injury) with subsequent damage to sensory, motor, and/or autonomic systems, resulting in  permanent pain, numbness, and/or weakness of one or several areas of the body; allergic reactions; (i.e.: anaphylactic reaction); and/or death. Furthermore, the patient was informed of those risks and complications  associated with the medications. These include, but are not limited to: allergic reactions (i.e.: anaphylactic or anaphylactoid reaction(s)); adrenal axis suppression; blood sugar elevation that in diabetics may result in ketoacidosis or comma; water retention that in patients with history of congestive heart failure may result in shortness of breath, pulmonary edema, and decompensation with resultant heart failure; weight gain; swelling or edema; medication-induced neural toxicity; particulate matter embolism and blood vessel occlusion with resultant organ, and/or nervous system infarction; and/or aseptic necrosis of one or more joints. Finally, the patient was informed that Medicine is not an exact science; therefore, there is also the possibility of unforeseen or unpredictable risks and/or possible complications that may result in a catastrophic outcome. The patient indicated having understood very clearly. We have given the patient no guarantees and we have made no promises. Enough time was given to the patient to ask questions, all of which were answered to the patient's satisfaction. Teresa [name] has indicated that she wanted to continue with the procedure. Attestation: I, the ordering provider, attest that I have discussed with the patient the benefits, risks, side-effects, alternatives, likelihood of achieving goals, and potential problems during recovery for the procedure that I have provided informed consent. Date: 10/15/2017; Time: 7:20 AM  Pre-Procedure Preparation:  Monitoring: As per clinic protocol. Respiration, ETCO2, SpO2, BP, heart rate and rhythm monitor placed and checked for adequate function Safety Precautions: Patient was assessed for positional comfort and pressure points  before starting the procedure. Time-out: I initiated and conducted the "Time-out" before starting the procedure, as per protocol. The patient was asked to participate by confirming the accuracy of the "Time Out" information. Verification of the correct person, site, and procedure were performed and confirmed by me, the nursing staff, and the patient. "Time-out" conducted as per Joint Commission's Universal Protocol (UP.01.01.01). "Time-out" Date & Time: 10/15/2017; 1042 hrs.  Description of Procedure Process:   Position: Prone with head of the table was raised to facilitate breathing. Target Area: For Cervical Facet blocks, the target is the postero-lateral waist of the articular pillars at the C3, C4, C5, C6, & C7 levels. Approach: Posterior approach. Area Prepped: Entire Posterior Cervico-thoracic Region Prepping solution: ChloraPrep (2% chlorhexidine gluconate and 70% isopropyl alcohol) Safety Precautions: Aspiration looking for blood return was conducted prior to all injections. At no point did we inject any substances, as a needle was being advanced. No attempts were made at seeking any paresthesias. Safe injection practices and needle disposal techniques used. Medications properly checked for expiration dates. SDV (single dose vial) medications used. Description of the Procedure: Protocol guidelines were followed. The patient was placed in position over the fluoroscopy table. The target area was identified and the area prepped in the usual manner. Skin desensitized using vapocoolant spray. Skin & deeper tissues infiltrated with local anesthetic. Appropriate amount of time allowed to pass for local anesthetics to take effect. The procedure needle was introduced through the skin, ipsilateral to the reported pain, and advanced to the target area. Bone was contacted on the posterior aspect of the articular pillars and the needle walked lateral, until the border was cleared. Lateral views taken to make  sure the needle tip did not advance past the posterior third of the lateral mass of the posterior columns. The procedure was repeated in identical fashion for each level. Negative aspiration confirmed. Solution injected in intermittent fashion, asking for systemic symptoms every 0.5cc of injectate. The needles were then removed and the area cleansed, making sure to leave some of the  prepping solution back to take advantage of its long term bactericidal properties. Vitals:   10/15/17 1055 10/15/17 1057 10/15/17 1107 10/15/17 1117  BP: 132/72 132/71 (!) 173/77 (!) 167/70  Pulse:      Resp: Temp:   (!) 96.6 F (35.9 C)   TempSrc:      SpO2: 96% 96% 100% 100%  Weight:      Height:        Start Time: 1042 hrs. End Time: 1057 hrs. Materials:  Needle(s) Type: Regular needle Gauge: 22G Length: 3.5-in Medication(s): We administered midazolam, fentaNYL, lidocaine, dexamethasone, ropivacaine (PF) 2 mg/mL (0.2%), dexamethasone, and ropivacaine (PF) 2 mg/mL (0.2%). Please see chart orders for dosing details.  Imaging Guidance (Spinal):  Type of Imaging Technique: Fluoroscopy Guidance (Spinal) Indication(s): Assistance in needle guidance and placement for procedures requiring needle placement in or near specific anatomical locations not easily accessible without such assistance. Exposure Time: Please see nurses notes. Contrast: None used. Fluoroscopic Guidance: I was personally present during the use of fluoroscopy. "Tunnel Vision Technique" used to obtain the best possible view of the target area. Parallax error corrected before commencing the procedure. "Direction-depth-direction" technique used to introduce the needle under continuous pulsed fluoroscopy. Once target was reached, antero-posterior, oblique, and lateral fluoroscopic projection used confirm needle placement in all planes. Images permanently stored in EMR. Interpretation: No contrast injected. I personally interpreted the  imaging intraoperatively. Adequate needle placement confirmed in multiple planes. Permanent images saved into the patient's record.  Antibiotic Prophylaxis:  Indication(s): None identified Antibiotic given: None  Post-operative Assessment:  EBL: None Complications: No immediate post-treatment complications observed by team, or reported by patient. Note: The patient tolerated the entire procedure well. A repeat set of vitals were taken after the procedure and the patient was kept under observation following institutional policy, for this type of procedure. Post-procedural neurological assessment was performed, showing return to baseline, prior to discharge. The patient was provided with post-procedure discharge instructions, including a section on how to identify potential problems. Should any problems arise concerning this procedure, the patient was given instructions to immediately contact us, at any time, without hesitation. In any case, we plan to contact the patient by telephone for a follow-up status report regarding this interventional procedure. Comments:  No additional relevant information.  Plan of Care    Imaging Orders     DG C-Arm 1-60 Min-No Report  Procedure Orders     CERVICAL FACET (MEDIAL BRANCH NERVE BLOCK)   Medications ordered for procedure: Meds ordered this encounter  Medications  . lactated ringers infusion 1,000 mL  . midazolam (VERSED) 5 MG/5ML injection 1-2 mg    Make sure Flumazenil is available in the pyxis when using this medication. If oversedation occurs, administer 0.2 mg IV over 15 sec. If after 45 sec no response, administer 0.2 mg again over 1 min; may repeat at 1 min intervals; not to exceed 4 doses (1 mg)  . fentaNYL (SUBLIMAZE) injection 25-50 mcg    Make sure Narcan is available in the pyxis when using this medication. In the event of respiratory depression (RR< 8/min): Titrate NARCAN (naloxone) in increments of 0.1 to 0.2 mg IV at 2-3 minute  intervals, until desired degree of reversal.  . lidocaine (XYLOCAINE) 2 % (with pres) injection 200 mg  . dexamethasone (DECADRON) injection 10 mg  . ropivacaine (PF) 2 mg/mL (0.2%) (NAROPIN) injection 9 mL  . dexamethasone (DECADRON) injection 10 mg  . ropivacaine (PF) 2 mg/mL (0.2%) (  NAROPIN) injection 9 mL   Medications administered: We administered midazolam, fentaNYL, lidocaine, dexamethasone, ropivacaine (PF) 2 mg/mL (0.2%), dexamethasone, and ropivacaine (PF) 2 mg/mL (0.2%).  See the medical record for exact dosing, route, and time of administration.  New Prescriptions   No medications on file   Disposition: Discharge home  Discharge Date & Time: 10/15/2017; 1027 hrs.   Physician-requested Follow-up: Return for post-procedure eval by Dr. Laban Emperor in 2 wks. Future Appointments Date Time Provider Department Center  10/30/2017 2:00 PM Delano Metz, MD [facility] None  03/18/2018 6:00 PM [facility] None   Primary Care Physician: Lesli Albee, MD Location: [facility] Note by: Oswaldo Done, MD Date: 10/15/2017; Time: 11:27 AM  Disclaimer:  Medicine is not an exact science. The only guarantee in medicine is that nothing is guaranteed. It is important to note that the decision to proceed with this intervention was based on the information collected from the patient. The Data and conclusions were drawn from the patient's questionnaire, the interview, and the physical examination. Because the information was provided in large part by the patient, it cannot be guaranteed that it has not been purposely or unconsciously manipulated. Every effort has been made to obtain as much relevant data as possible for this evaluation. It is important to note that the conclusions that lead to this procedure are derived in large part from the available data. Always take into account that the treatment will also be dependent on  availability of resources and existing treatment guidelines, considered by other Pain Management Practitioners as being common knowledge and practice, at the time of the intervention. For Medico-Legal purposes, it is also important to point out that variation in procedural techniques and pharmacological choices are the acceptable norm. The indications, contraindications, technique, and results of the above procedure should only be interpreted and judged by a Board-Certified Interventional Pain Specialist with extensive familiarity and expertise in the same exact procedure and technique.

## 2017-10-16 ENCOUNTER — Telehealth: Payer: Self-pay

## 2017-10-16 NOTE — Telephone Encounter (Signed)
Post procedure phone call.  States she is doing good.  

## 2017-10-29 NOTE — Progress Notes (Signed)
Patient's Name: Teresa [name]  MRN: [id number]  Referring Provider: Suzanna Obey, MD  DOB: [birth year]  PCP: Suzanna Obey, MD  DOS: 10/30/2017  Note by: Gaspar Cola, MD  Service setting: Ambulatory outpatient  Specialty: Interventional Pain Management  Location: [facility] (AMB) Pain Management Facility    Patient type: Established   Primary Reason(s) for Visit: Encounter for post-procedure evaluation of chronic illness with mild to moderate exacerbation CC: Neck Pain (back of neck and to the left)  HPI  Teresa [name] is a 56 y.o. year old, female patient, who comes today for a post-procedure evaluation. She has History of panic attacks; Encounter for therapeutic drug level monitoring; Benzodiazepine dependence ([place]); Chronic pain syndrome (WC); Chronic upper back pain (WC); Thoracic back pain (WC); Hand pain; H/O cervical spine surgery; Chronic knee pain (Bilateral); Chronic low back pain; Cervical spondylosis (WC); Generalized anxiety disorder; Cervical facet syndrome (WC) (Bilateral) (L>R); Diffuse arthralgia; Tobacco use disorder; Fibromyalgia;  History of anterior cervical discectomy and fusion (ACDF) (left side)  (WC); Cervical radicular pain (WC) (Primary Area of Pain) (Left); Healthcare maintenance; 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 Her primarily concern today is the Neck Pain (back of neck and to the  left)  Pain Assessment: Location: Posterior, Left Neck Radiating: down the upper left collar bone and tender to the back shoulder blade on the left Onset: More than a month ago Duration: Chronic pain Quality: Aching, Constant, Dull, Sharp, Radiating, Sore (dull pain in the back shoulder blade, sharp pain in the front left collarbone, and radiates down the left arm) Severity: 1 /10 (self-reported pain score)  Note: Reported level is compatible with observation.             A 1/10 is viewed as "Mild" and described as nagging, annoying, but not interfering with basic activities of daily living (ADL). Teresa [name] is able to eat, bathe, get dressed, do toileting (being able to get on and off the toilet and perform personal hygiene functions), transfer (move in and out of bed or a chair without assistance), and maintain continence (able to control bladder and bowel functions). Physiologic parameters such as blood pressure and heart rate apear wnl.       When using our objective Pain Scale, levels between 6 and 10/10 are said to belong in an emergency room, as it progressively worsens from a 6/10, described as severely limiting, requiring emergency care not usually available at an outpatient pain management facility. At a 6/10 level, communication becomes difficult and requires great effort. Assistance to reach the emergency department may be required. Facial flushing and profuse sweating along with potentially dangerous increases in heart rate and blood pressure will be evident. Effect on ADL: pace self Timing: Constant Modifying factors: massaging  areas helps the pain  Teresa [name] comes in today for post-procedure evaluation after the treatment done on 10/15/2017. At this point, we have confirmed that the patient obtained excellent relief of the pain with the diagnostic bilateral cervical facet blocks. At this point, I believe it  to be necessary for this patient to undergo radiofrequency of the cervical facets  in order to attempt lumbar lasting benefits.  Further details on both, my assessment(s), as well as the proposed treatment plan, please see below.  Post-Procedure Assessment  10/15/2017 Procedure: Diagnostic bilateral cervical facet block #2 under fluoroscopic guidance and IV sedation. Pre-procedure pain score:  3/10 Post-procedure pain score: 0/10 (100% relief) Influential Factors: BMI: 22.71 kg/m Intra-procedural challenges: None observed.         Assessment challenges: None detected.              Reported side-effects: None.        Post-procedural adverse reactions or complications: None reported         Sedation: Sedation provided. When no sedatives are used, the analgesic levels obtained are directly associated to the effectiveness of the local anesthetics. However, when sedation is provided, the level of analgesia obtained during the initial 1 hour following the intervention, is believed to be the result of a combination of factors. These factors may include, but are not limited to: 1. The effectiveness of the local anesthetics used. 2. The effects of the analgesic(s) and/or anxiolytic(s) used. 3. The degree of discomfort experienced by the patient at the time of the procedure. 4. The patients ability and reliability in recalling and recording the events. 5. The presence and influence of possible secondary gains and/or psychosocial factors. Reported result: Relief experienced during the 1st hour after the procedure: 100 % (Ultra-Short Term Relief) Teresa [name] has indicated area to have been numb during this time. Interpretative annotation: Clinically appropriate result. Analgesia during this period is likely to be Local Anesthetic and/or IV Sedative (Analgesic/Anxiolytic) related.          Effects of local anesthetic: The analgesic effects attained during this period are directly associated to the localized infiltration of local anesthetics and therefore cary significant diagnostic value  as to the etiological location, or anatomical origin, of the pain. Expected duration of relief is directly dependent on the pharmacodynamics of the local anesthetic used. Long-acting (4-6 hours) anesthetics used.  Reported result: Relief during the next 4 to 6 hour after the procedure: 90 % (Short-Term Relief)            Interpretative annotation: Clinically appropriate result. Analgesia during this period is likely to be Local Anesthetic-related.     Long-term benefit: Defined as the period of time past the expected duration of local anesthetics (1 hour for short-acting and 4-6 hours for long-acting). With the possible exception of prolonged sympathetic blockade from the local anesthetics, benefits during this period are typically attributed to, or associated with, other factors such as analgesic sensory neuropraxia, antiinflammatory effects, or beneficial biochemical changes provided by agents other than the local anesthetics.  Reported result: Extended relief following procedure: 60 % (for the last two weeks, ) (Long-Term Relief)            Interpretative annotation: Clinically possible results. Partial relief. No long-term benefit expected. Inflammation plays a part in the etiology to the pain. Etiology is likely inflammatory and mechanical.  Current benefits: Defined as reported results that persistent at this point in time.   Analgesia: >50 % Teresa [name] reports improvement of axial symptoms. Function: Teresa [name] reports improvement in function ROM: Teresa [name] reports improvement in ROM Interpretative annotation: Ongoing benefit. No long-term benefit expected. Effective therapeutic approach. Benefit could be steroid-related.  Interpretation: Results would suggest Teresa [name] to be a good candidate for Radiofrequency Ablation. The  patient has failed to respond to conservative therapies including over-the-counter medications, anti-inflammatories, muscle relaxants, membrane stabilizers, opioids, physical  therapy modalities such as heat and ice, as well as more invasive techniques such as nerve blocks. Because Teresa [name] did attain more than 50% relief of the pain during a series of diagnostic blocks conducted in separate occasions, I believe it is medically necessary to proceed with Radiofrequency Ablation, in order to attempt gaining longer relief. Teresa [name] indicates having had an unsuccessful trial of physical therapy, which she described as non-beneficial and painful.  Plan:  Proceed with Radiofrequency Ablation for the purpose of attaining long-term benefits.        Laboratory Chemistry  Inflammation Markers (CRP: Acute Phase) (ESR: Chronic Phase) Lab Results  Component Value Date   CRP <0.8 06/12/2017   ESRSEDRATE 6 06/12/2017                 Renal Function Markers Lab Results  Component Value Date   BUN 21 09/05/2017   CREATININE 1.11 (H) 09/05/2017   GFRAA 64 09/05/2017   GFRNONAA 56 (L) 09/05/2017                 Hepatic Function Markers Lab Results  Component Value Date   AST 17 09/05/2017   ALT 14 09/05/2017   ALBUMIN 4.9 09/05/2017   ALKPHOS 72 09/05/2017                 Electrolytes Lab Results  Component Value Date   NA 143 09/05/2017   K 5.6 (H) 09/05/2017   CL 101 09/05/2017   CALCIUM 9.9 09/05/2017   MG 1.9 06/12/2017                 Neuropathy Markers No results found for: [id number]               Bone Pathology Markers Lab Results  Component Value Date   ALKPHOS 72 09/05/2017   VD25OH 22.2 (L) 06/12/2017   CALCIUM 9.9 09/05/2017                 Coagulation Parameters Lab Results  Component Value Date   PLT 319 09/05/2017                 Cardiovascular Markers Lab Results  Component Value Date   HGB 14.4 09/05/2017   HCT 41.9 09/05/2017                 Note: Lab results reviewed.  Recent Diagnostic Imaging Results  DG C-Arm 1-60 Min-No Report Fluoroscopy was utilized by the requesting physician.  No radiographic  interpretation.    Complexity Note: Imaging results reviewed. Results shared with Teresa [name], using Layman's terms.                         Meds   Current Outpatient Prescriptions:  .  alprazolam (XANAX) 2 MG tablet, Take 2 mg by mouth 2 (two) times daily as needed. , Disp: , Rfl:  .  cyclobenzaprine (FLEXERIL) 5 MG tablet, Take 1 tablet (5 mg total) by mouth 2 (two) times daily as needed for muscle spasms., Disp: 60 tablet, Rfl: 5 .  FLUoxetine (PROZAC) 20 MG tablet, Take 20 mg by mouth daily., Disp: , Rfl:  .  meloxicam (MOBIC) 15 MG tablet, Take 1 tablet (15 mg total) by mouth daily., Disp: 30 tablet, Rfl: 5 .  pregabalin (LYRICA) 25 MG capsule, Take 1 capsule (25 mg  total) by mouth at bedtime., Disp: 30 capsule, Rfl: 5  ROS  Constitutional: Denies any fever or chills Gastrointestinal: No reported hemesis, hematochezia, vomiting, or acute GI distress Musculoskeletal: Denies any acute onset joint swelling, redness, loss of ROM, or weakness Neurological: No reported episodes of acute onset apraxia, aphasia, dysarthria, agnosia, amnesia, paralysis, loss of coordination, or loss of consciousness  Allergies  Teresa [name] has No Known Allergies.  [place]  Drug: Teresa [name]  reports that she does not use drugs. Alcohol:  reports that she does not drink alcohol. Tobacco:  reports that she has been smoking.  She has never used smokeless tobacco. Medical:  has a past medical history of  History of anterior cervical discectomy and fusion (ACDF) (left side). (10/18/2015); Bilateral hip pain (10/18/2015); Cervical spine pain (WC) (10/18/2015); Cervical spondylosis with myelopathy; Chronic cervical radiculopathy (WC) (10/18/2015); Chronic constipation; Chronic pain syndrome; Depression; Fibromyalgia; GERD (gastroesophageal reflux disease); Insomnia; Migraine; Neck pain; and Panic attack. Surgical: Teresa [name]  has a past surgical history that includes Neck surgery; btl; and Tubal ligation (Bilateral). Family: family history  is not on file.  Constitutional Exam  General appearance: Well nourished, well developed, and well hydrated. In no apparent acute distress Vitals:   10/30/17 1411  BP: 111/77  Pulse: 82  Resp: 16  Temp: 99 F (37.2 C)  SpO2: 100%  Weight: 145 lb (65.8 kg)  Height: _0  (1.702 m)   BMI Assessment: Estimated body mass index is 22.71 kg/m as calculated from the following:   Height as of this encounter: _1  (1.702 m).   Weight as of this encounter: 145 lb (65.8 kg).  BMI interpretation table: BMI level Category Range association with higher incidence of chronic pain  <18 kg/m2 Underweight   18.5-24.9 kg/m2 Ideal body weight   25-29.9 kg/m2 Overweight Increased incidence by 20%  30-34.9 kg/m2 Obese (Class I) Increased incidence by 68%  35-39.9 kg/m2 Severe obesity (Class II) Increased incidence by 136%  >40 kg/m2 Extreme obesity (Class III) Increased incidence by 254%   BMI Readings from Last 4 Encounters:  10/30/17 22.71 kg/m  10/15/17 22.71 kg/m  09/19/17 22.58 kg/m  09/05/17 22.44 kg/m   Wt Readings from Last 4 Encounters:  10/30/17 145 lb (65.8 kg)  10/15/17 145 lb (65.8 kg)  09/19/17 144 lb 3.2 oz (65.4 kg)  09/05/17 143 lb 4.8 oz (65 kg)  Psych/Mental status: Alert, oriented x 3 (person, place, & time)       Eyes: PERLA Respiratory: No evidence of acute respiratory distress  Cervical Spine Area Exam  Skin & Axial Inspection: No masses, redness, edema, swelling, or associated skin lesions Alignment: Symmetrical Functional ROM: Minimal ROM      Stability: No instability detected Muscle Tone/Strength: Functionally intact. No obvious neuro-muscular anomalies detected. Sensory (Neurological): Movement-associated pain Palpation: Complains of area being tender to palpation              Upper Extremity (UE) Exam    Side: Right upper extremity  Side: Left upper extremity  Skin & Extremity Inspection: Skin color, temperature, and hair growth are WNL. No peripheral  edema or cyanosis. No masses, redness, swelling, asymmetry, or associated skin lesions. No contractures.  Skin & Extremity Inspection: Skin color, temperature, and hair growth are WNL. No peripheral edema or cyanosis. No masses, redness, swelling, asymmetry, or associated skin lesions. No contractures.  Functional ROM: Unrestricted ROM          Functional ROM: Unrestricted ROM  Muscle Tone/Strength: Functionally intact. No obvious neuro-muscular anomalies detected.  Muscle Tone/Strength: Functionally intact. No obvious neuro-muscular anomalies detected.  Sensory (Neurological): Unimpaired          Sensory (Neurological): Unimpaired          Palpation: No palpable anomalies              Palpation: No palpable anomalies              Specialized Test(s): Deferred         Specialized Test(s): Deferred          Thoracic Spine Area Exam  Skin & Axial Inspection: No masses, redness, or swelling Alignment: Symmetrical Functional ROM: Unrestricted ROM Stability: No instability detected Muscle Tone/Strength: Functionally intact. No obvious neuro-muscular anomalies detected. Sensory (Neurological): Unimpaired Muscle strength & Tone: No palpable anomalies  Lumbar Spine Area Exam  Skin & Axial Inspection: No masses, redness, or swelling Alignment: Symmetrical Functional ROM: Unrestricted ROM      Stability: No instability detected Muscle Tone/Strength: Functionally intact. No obvious neuro-muscular anomalies detected. Sensory (Neurological): Unimpaired Palpation: No palpable anomalies       Provocative Tests: Lumbar Hyperextension and rotation test: evaluation deferred today       Lumbar Lateral bending test: evaluation deferred today       Patrick's Maneuver: evaluation deferred today                    Gait & Posture Assessment  Ambulation: Unassisted Gait: Relatively normal for age and body habitus Posture: WNL   Lower Extremity Exam    Side: Right lower extremity  Side: Left lower  extremity  Skin & Extremity Inspection: Skin color, temperature, and hair growth are WNL. No peripheral edema or cyanosis. No masses, redness, swelling, asymmetry, or associated skin lesions. No contractures.  Skin & Extremity Inspection: Skin color, temperature, and hair growth are WNL. No peripheral edema or cyanosis. No masses, redness, swelling, asymmetry, or associated skin lesions. No contractures.  Functional ROM: Unrestricted ROM          Functional ROM: Unrestricted ROM          Muscle Tone/Strength: Functionally intact. No obvious neuro-muscular anomalies detected.  Muscle Tone/Strength: Functionally intact. No obvious neuro-muscular anomalies detected.  Sensory (Neurological): Unimpaired  Sensory (Neurological): Unimpaired  Palpation: No palpable anomalies  Palpation: No palpable anomalies   Assessment  Primary Diagnosis & Pertinent Problem List: The primary encounter diagnosis was Cervical facet syndrome (WC) (Bilateral) (L>R). 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  Status Diagnosis  Improving Improving Stable 1. Cervical facet syndrome (WC) (Bilateral) (L>R)   2. Chronic neck pain (WC) (Secondary Area of Pain) (Bilateral) (L>R)   3. Cervical spondylosis (WC)   4. Cervicogenic headache (WC)   5. 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 Chronic shoulder pain (Secondary Area of Pain) (Bilateral) (L>R)     Problems updated and reviewed during this visit: Problem  Cervicogenic headache (WC)   Plan of Care  Pharmacotherapy (Medications Ordered): Meds ordered this encounter  Medications  . meloxicam (MOBIC) 15 MG tablet    Sig: Take 1 tablet (15 mg total) by mouth daily.    Dispense:  30 tablet     Refill:  5  Do not place medication on "Automatic Refill". Fill one day early if pharmacy is closed on scheduled refill date.   New Prescriptions   No medications on file   Medications administered today: Teresa [name] had no medications administered during this visit.   Procedure Orders     Radiofrequency,Cervical     CERVICAL FACET (MEDIAL BRANCH NERVE BLOCK)  Lab Orders  No laboratory test(s) ordered today   Imaging Orders  No imaging studies ordered today   Referral Orders  No referral(s) requested today    Interventional management options: Planned, scheduled, and/or pending:   Therapeutic left-sided cervical facet RFA under fluoroscopic guidance and IV sedation. (If it takes long for approval, then palliative bilateral cervical facet block under fluoroscopic guidance and IV sedation.   Considering:   Diagnostic bilateral Cervical FacetBlock #3 Possible bilateral cervical facet RFA (Starting with the left side) Palliative Left CESI under fluoro and IV sedation.   Palliative PRN treatment(s):   Palliative left cervical epidural steroid injection    Provider-requested follow-up: Return for RFA (fluoro + sedation): (L) C-FCT RFA.  Future Appointments Date Time Provider [place]  03/18/2018 6:00 PM [facility] [place] [facility] None   Primary Care Physician: Suzanna Obey, MD Location: [facility] Out[facility] Management Facility Note by: Gaspar Cola, MD Date: 10/30/2017; Time: 3:39 PM

## 2017-10-30 ENCOUNTER — Encounter: Payer: Self-pay | Admitting: Pain Medicine

## 2017-10-30 ENCOUNTER — Ambulatory Visit: Payer: Worker's Compensation | Attending: Pain Medicine | Admitting: Pain Medicine

## 2017-10-30 VITALS — BP 111/77 | HR 82 | Temp 99.0°F | Resp 16 | Ht 67.0 in | Wt 145.0 lb

## 2017-10-30 DIAGNOSIS — M25552 Pain in left hip: Secondary | ICD-10-CM | POA: Insufficient documentation

## 2017-10-30 DIAGNOSIS — M961 Postlaminectomy syndrome, not elsewhere classified: Secondary | ICD-10-CM

## 2017-10-30 DIAGNOSIS — M797 Fibromyalgia: Secondary | ICD-10-CM | POA: Insufficient documentation

## 2017-10-30 DIAGNOSIS — M25511 Pain in right shoulder: Secondary | ICD-10-CM | POA: Diagnosis not present

## 2017-10-30 DIAGNOSIS — F329 Major depressive disorder, single episode, unspecified: Secondary | ICD-10-CM | POA: Diagnosis not present

## 2017-10-30 DIAGNOSIS — M4712 Other spondylosis with myelopathy, cervical region: Secondary | ICD-10-CM | POA: Insufficient documentation

## 2017-10-30 DIAGNOSIS — M79643 Pain in unspecified hand: Secondary | ICD-10-CM | POA: Diagnosis not present

## 2017-10-30 DIAGNOSIS — R51 Headache: Secondary | ICD-10-CM | POA: Insufficient documentation

## 2017-10-30 DIAGNOSIS — Z79899 Other long term (current) drug therapy: Secondary | ICD-10-CM | POA: Diagnosis not present

## 2017-10-30 DIAGNOSIS — M25561 Pain in right knee: Secondary | ICD-10-CM | POA: Diagnosis not present

## 2017-10-30 DIAGNOSIS — Z720 Tobacco use: Secondary | ICD-10-CM | POA: Diagnosis not present

## 2017-10-30 DIAGNOSIS — M25512 Pain in left shoulder: Secondary | ICD-10-CM | POA: Diagnosis not present

## 2017-10-30 DIAGNOSIS — G8929 Other chronic pain: Secondary | ICD-10-CM | POA: Diagnosis not present

## 2017-10-30 DIAGNOSIS — F411 Generalized anxiety disorder: Secondary | ICD-10-CM | POA: Insufficient documentation

## 2017-10-30 DIAGNOSIS — M25551 Pain in right hip: Secondary | ICD-10-CM | POA: Diagnosis not present

## 2017-10-30 DIAGNOSIS — M546 Pain in thoracic spine: Secondary | ICD-10-CM | POA: Diagnosis not present

## 2017-10-30 DIAGNOSIS — K219 Gastro-esophageal reflux disease without esophagitis: Secondary | ICD-10-CM | POA: Insufficient documentation

## 2017-10-30 DIAGNOSIS — M25562 Pain in left knee: Secondary | ICD-10-CM | POA: Insufficient documentation

## 2017-10-30 DIAGNOSIS — F410 Panic disorder [episodic paroxysmal anxiety] without agoraphobia: Secondary | ICD-10-CM | POA: Insufficient documentation

## 2017-10-30 DIAGNOSIS — M47812 Spondylosis without myelopathy or radiculopathy, cervical region: Secondary | ICD-10-CM | POA: Insufficient documentation

## 2017-10-30 DIAGNOSIS — M542 Cervicalgia: Secondary | ICD-10-CM | POA: Diagnosis not present

## 2017-10-30 DIAGNOSIS — G4486 Cervicogenic headache: Secondary | ICD-10-CM | POA: Insufficient documentation

## 2017-10-30 MED ORDER — MELOXICAM 15 MG PO TABS: 15.0000 mg | ORAL_TABLET | Freq: Every day | ORAL | 5 refills | Status: DC

## 2017-10-30 NOTE — Progress Notes (Signed)
Safety precautions to be maintained throughout the outpatient stay will include: orient to surroundings, keep bed in low position, maintain call bell within reach at all times, provide assistance with transfer out of bed and ambulation.  

## 2017-10-30 NOTE — Patient Instructions (Addendum)
____________________________________________________________________________________________  Preparing for Procedure with Sedation Instructions: . Oral Intake: Do not eat or drink anything for at least 8 hours prior to your procedure. . Transportation: Public transportation is not allowed. Bring an adult driver. The driver must be physically present in our waiting room before any procedure can be started. Marland Kitchen Physical Assistance: Bring an adult physically capable of assisting you, in the event you need help. This adult should keep you company at home for at least 6 hours after the procedure. . Blood Pressure Medicine: Take your blood pressure medicine with a sip of water the morning of the procedure. . Blood thinners:  . Diabetics on insulin: Notify the staff so that you can be scheduled 1st case in the morning. If your diabetes requires high dose insulin, take only  of your normal insulin dose the morning of the procedure and notify the staff that you have done so. . Preventing infections: Shower with an antibacterial soap the morning of your procedure. . Build-up your immune system: Take 1000 mg of Vitamin C with every meal (3 times a day) the day prior to your procedure. Marland Kitchen Antibiotics: Inform the staff if you have a condition or reason that requires you to take antibiotics before dental procedures. . Pregnancy: If you are pregnant, call and cancel the procedure. . Sickness: If you have a cold, fever, or any active infections, call and cancel the procedure. . Arrival: You must be in the facility at least 30 minutes prior to your scheduled procedure. . Children: Do not bring children with you. . Dress appropriately: Bring dark clothing that you would not mind if they get stained. . Valuables: Do not bring any jewelry or valuables. Procedure appointments are reserved for interventional treatments only. Marland Kitchen No Prescription Refills. . No medication changes will be discussed during procedure  appointments. . No disability issues will be discussed. ____________________________________________________________________________________________  Pain Management Discharge Instructions  General Discharge Instructions :  If you need to reach your doctor call: Monday-Friday 8:00 am - 4:00 pm at [phone] or toll free [phone].  After clinic hours [phone] to have operator reach doctor.  Bring all of your medication bottles to all your appointments in the pain clinic.  To cancel or reschedule your appointment with Pain Management please remember to call 24 hours in advance to avoid a fee.  Refer to the educational materials which you have been given on: General Risks, I had my Procedure. Discharge Instructions, Post Sedation.  Post Procedure Instructions:  The drugs you were given will stay in your system until tomorrow, so for the next 24 hours you should not drive, make any legal decisions or drink any alcoholic beverages.  You may eat anything you prefer, but it is better to start with liquids then soups and crackers, and gradually work up to solid foods.  Please notify your doctor immediately if you have any unusual bleeding, trouble breathing or pain that is not related to your normal pain.  Depending on the type of procedure that was done, some parts of your body may feel week and/or numb.  This usually clears up by tonight or the next day.  Walk with the use of an assistive device or accompanied by an adult for the 24 hours.  You may use ice on the affected area for the first 24 hours.  Put ice in a Ziploc bag and cover with a towel and place against area 15 minutes on 15 minutes off.  You may switch to heat after  24 hours.Radiofrequency Lesioning, Care After Refer to this sheet in the next few weeks. These instructions provide you with information about caring for yourself after your procedure. Your health care provider may also give you more specific instructions.  Your treatment has been planned according to current medical practices, but problems sometimes occur. Call your health care provider if you have any problems or questions after your procedure. What can I expect after the procedure? After the procedure, it is common to have:  Pain from the burned nerve.  Temporary numbness.  Follow these instructions at home:  Take over-the-counter and prescription medicines only as told by your health care provider.  Return to your normal activities as told by your health care provider. Ask your health care provider what activities are safe for you.  Pay close attention to how you feel after the procedure. If you start to have pain, write down when it hurts and how it feels. This will help you and your health care provider to know if you need an additional treatment.  Check your needle insertion site every day for signs of infection. Watch for: ? Redness, swelling, or pain. ? Fluid, blood, or pus.  Keep all follow-up visits as told by your health care provider. This is important. Contact a health care provider if:  Your pain does not get better.  You have redness, swelling, or pain at the needle insertion site.  You have fluid, blood, or pus coming from the needle insertion site.  You have a fever. Get help right away if:  You develop sudden, severe pain.  You develop numbness or tingling near the procedure site that does not go away. This information is not intended to replace advice given to you by your health care provider. Make sure you discuss any questions you have with your health care provider. Document Released: 08/16/2011 Document Revised: 05/24/2016 Document Reviewed: 01/24/2015 Elsevier Interactive Patient Education  2018 [employer] Radiofrequency Lesioning Radiofrequency lesioning is a procedure that is performed to relieve pain. The procedure is often used for back, neck, or arm pain. Radiofrequency lesioning involves the use  of a machine that creates radio waves to make heat. During the procedure, the heat is applied to the nerve that carries the pain signal. The heat damages the nerve and interferes with the pain signal. Pain relief usually starts about 2 weeks after the procedure and lasts for 6 months to 1 year. Tell a health care provider about:  Any allergies you have.  All medicines you are taking, including vitamins, herbs, eye drops, creams, and over-the-counter medicines.  Any problems you or family members have had with anesthetic medicines.  Any blood disorders you have.  Any surgeries you have had.  Any medical conditions you have.  Whether you are pregnant or may be pregnant. What are the risks? Generally, this is a safe procedure. However, problems may occur, including:  Pain or soreness at the injection site.  Infection at the injection site.  Damage to nerves or blood vessels.  What happens before the procedure?  Ask your health care provider about: ? Changing or stopping your regular medicines. This is especially important if you are taking diabetes medicines or blood thinners. ? Taking medicines such as aspirin and ibuprofen. These medicines can thin your blood. Do not take these medicines before your procedure if your health care provider instructs you not to.  Follow instructions from your health care provider about eating or drinking restrictions.  Plan to have someone  take you home after the procedure.  If you go home right after the procedure, plan to have someone with you for 24 hours. What happens during the procedure?  You will be given one or more of the following: ? A medicine to help you relax (sedative). ? A medicine to numb the area (local anesthetic).  You will be awake during the procedure. You will need to be able to talk with the health care provider during the procedure.  With the help of a type of X-ray (fluoroscopy), the health care provider will insert a  radiofrequency needle into the area to be treated.  Next, a wire that carries the radio waves (electrode) will be put through the radiofrequency needle. An electrical pulse will be sent through the electrode to verify the correct nerve. You will feel a tingling sensation, and you may have muscle twitching.  Then, the tissue that is around the needle tip will be heated by an electric current that is passed using the radiofrequency machine. This will numb the nerves.  A bandage (dressing) will be put on the insertion area after the procedure is done. The procedure may vary among health care providers and hospitals. What happens after the procedure?  Your blood pressure, heart rate, breathing rate, and blood oxygen level will be monitored often until the medicines you were given have worn off.  Return to your normal activities as directed by your health care provider. This information is not intended to replace advice given to you by your health care provider. Make sure you discuss any questions you have with your health care provider. Document Released: 08/15/2011 Document Revised: 05/24/2016 Document Reviewed: 01/24/2015 Elsevier Interactive Patient Education  2018 [employer].  Facet Joint Block, Care After Refer to this sheet in the next few weeks. These instructions provide you with information about caring for yourself after your procedure. Your health care provider may also give you more specific instructions. Your treatment has been planned according to current medical practices, but problems sometimes occur. Call your health care provider if you have any problems or questions after your procedure. What can I expect after the procedure? After the procedure, it is common to have:  Some tenderness over the injection sites for 2 days after the procedure.  A temporary increase in blood sugar if you have diabetes.  Follow these instructions at home:  Keep track of the amount of pain  relief you feel and how long it lasts.  Take over-the-counter and prescription medicines only as told by your health care provider. You may need to limit pain medicine within the first 4-6 hours after the procedure.  Remove your bandages (dressings) the morning after the procedure.  For the first 24 hours after the procedure: ? Do not apply heat near or over the injection sites. ? Do not take a bath or soak in water, such as in a pool or lake. ? Do not drive or operate heavy machinery unless approved by your health care provider. ? Avoid activities that require a lot of energy.  If the injection site is tender, try applying ice to the area. To do this: ? Put ice in a plastic bag. ? Place a towel between your skin and the bag. ? Leave the ice on for 20 minutes, 2-3 times a day.  Keep all follow-up visits as told by your health care provider. This is important. Contact a health care provider if:  Fluid is coming from an injection site.  There  is significant bleeding or swelling at an injection site.  You have diabetes and your blood sugar is above 180 mg/dL. Get help right away if:  You have a fever.  You have worsening pain or swelling around an injection site.  There are red streaks around an injection site.  You develop severe pain that is not controlled by your medicines.  You develop a headache, stiff neck, nausea, or vomiting.  Your eyes become very sensitive to light.  You have weakness, paralysis, or tingling in your arms or legs that was not present before the procedure.  You have difficulty urinating or breathing. This information is not intended to replace advice given to you by your health care provider. Make sure you discuss any questions you have with your health care provider. Document Released: 12/03/2012 Document Revised: 05/02/2016 Document Reviewed: 09/12/2015 Elsevier Interactive Patient Education  2018 [employer]  Facet Joint Block The facet  joints connect the bones of the spine (vertebrae). They make it possible for you to bend, twist, and make other movements with your spine. They also keep you from bending too far, twisting too far, and making other excessive movements. A facet joint block is a procedure where a numbing medicine (anesthetic) is injected into a facet joint. Often, a type of anti-inflammatory medicine called a steroid is also injected. A facet joint block may be done to diagnose neck or back pain. If the pain gets better after a facet joint block, it means the pain is probably coming from the facet joint. If the pain does not get better, it means the pain is probably not coming from the facet joint. A facet joint block may also be done to relieve neck or back pain caused by an inflamed facet joint. A facet joint block is only done to relieve pain if the pain does not improve with other methods, such as medicine, exercise programs, and physical therapy. Tell a health care provider about:  Any allergies you have.  All medicines you are taking, including vitamins, herbs, eye drops, creams, and over-the-counter medicines.  Any problems you or family members have had with anesthetic medicines.  Any blood disorders you have.  Any surgeries you have had.  Any medical conditions you have.  Whether you are pregnant or may be pregnant. What are the risks? Generally, this is a safe procedure. However, problems may occur, including:  Bleeding.  Injury to a nerve near the injection site.  Pain at the injection site.  Weakness or numbness in areas controlled by nerves near the injection site.  Infection.  Temporary fluid retention.  Allergic reactions to medicines or dyes.  Injury to other structures or organs near the injection site.  What happens before the procedure?  Follow instructions from your health care provider about eating or drinking restrictions.  Ask your health care provider about: ? Changing  or stopping your regular medicines. This is especially important if you are taking diabetes medicines or blood thinners. ? Taking medicines such as aspirin and ibuprofen. These medicines can thin your blood. Do not take these medicines before your procedure if your health care provider instructs you not to.  Do not take any new dietary supplements or medicines without asking your health care provider first.  Plan to have someone take you home after the procedure. What happens during the procedure?  You may need to remove your clothing and dress in an open-back gown.  The procedure will be done while you are lying on  an X-ray table. You will most likely be asked to lie on your stomach, but you may be asked to lie in a different position if an injection will be made in your neck.  Machines will be used to monitor your oxygen levels, heart rate, and blood pressure.  If an injection will be made in your neck, an IV tube will be inserted into one of your veins. Fluids and medicine will flow directly into your body through the IV tube.  The area over the facet joint where the injection will be made will be cleaned with soap. The surrounding skin will be covered with clean drapes.  A numbing medicine (local anesthetic) will be applied to your skin. Your skin may sting or burn for a moment.  A video X-ray machine (fluoroscopy) will be used to locate the joint. In some cases, a CT scan may be used.  A contrast dye may be injected into the facet joint area to help locate the joint.  When the joint is located, an anesthetic will be injected into the joint through the needle.  Your health care provider will ask you whether you feel pain relief. If you do feel relief, a steroid may be injected to provide pain relief for a longer period of time. If you do not feel relief or feel only partial relief, additional injections of an anesthetic may be made in other facet joints.  The needle will be  removed.  Your skin will be cleaned.  A bandage (dressing) will be applied over each injection site. The procedure may vary among health care providers and hospitals. What happens after the procedure?  You will be observed for 15-30 minutes before being allowed to go home. This information is not intended to replace advice given to you by your health care provider. Make sure you discuss any questions you have with your health care provider. Document Released: 05/08/2007 Document Revised: 01/18/2016 Document Reviewed: 09/12/2015 Elsevier Interactive Patient Education  [employer].

## 2018-01-16 ENCOUNTER — Ambulatory Visit
Admission: RE | Admit: 2018-01-16 | Discharge: 2018-01-16 | Disposition: A | Discharge status: Home / Self Care | Payer: Worker's Compensation | Source: Ambulatory Visit | Attending: Pain Medicine | Admitting: Pain Medicine

## 2018-01-16 ENCOUNTER — Ambulatory Visit: Payer: Worker's Compensation | Attending: Pain Medicine | Admitting: Pain Medicine

## 2018-01-16 ENCOUNTER — Encounter (INDEPENDENT_AMBULATORY_CARE_PROVIDER_SITE_OTHER): Payer: Self-pay

## 2018-01-16 ENCOUNTER — Encounter: Payer: Self-pay | Admitting: Pain Medicine

## 2018-01-16 VITALS — BP 142/82 | HR 76 | Temp 97.7°F | Resp 10 | Ht 67.0 in | Wt 145.0 lb

## 2018-01-16 DIAGNOSIS — F419 Anxiety disorder, unspecified: Secondary | ICD-10-CM | POA: Diagnosis not present

## 2018-01-16 DIAGNOSIS — M797 Fibromyalgia: Secondary | ICD-10-CM | POA: Diagnosis not present

## 2018-01-16 DIAGNOSIS — Z91012 Allergy to eggs: Secondary | ICD-10-CM | POA: Diagnosis not present

## 2018-01-16 DIAGNOSIS — M488X2 Other specified spondylopathies, cervical region: Secondary | ICD-10-CM | POA: Insufficient documentation

## 2018-01-16 DIAGNOSIS — G8929 Other chronic pain: Secondary | ICD-10-CM | POA: Insufficient documentation

## 2018-01-16 DIAGNOSIS — M542 Cervicalgia: Secondary | ICD-10-CM | POA: Insufficient documentation

## 2018-01-16 DIAGNOSIS — M62838 Other muscle spasm: Secondary | ICD-10-CM | POA: Diagnosis not present

## 2018-01-16 DIAGNOSIS — G8918 Other acute postprocedural pain: Secondary | ICD-10-CM | POA: Insufficient documentation

## 2018-01-16 DIAGNOSIS — Z791 Long term (current) use of non-steroidal anti-inflammatories (NSAID): Secondary | ICD-10-CM | POA: Diagnosis not present

## 2018-01-16 DIAGNOSIS — M47812 Spondylosis without myelopathy or radiculopathy, cervical region: Secondary | ICD-10-CM | POA: Diagnosis not present

## 2018-01-16 DIAGNOSIS — Z79899 Other long term (current) drug therapy: Secondary | ICD-10-CM | POA: Diagnosis not present

## 2018-01-16 MED ORDER — MELOXICAM 15 MG PO TABS: 15.0000 mg | ORAL_TABLET | Freq: Every day | ORAL | 5 refills | Status: DC

## 2018-01-16 MED ORDER — MIDAZOLAM HCL 5 MG/5ML IJ SOLN
1.0000 mg | INTRAMUSCULAR | Status: DC | PRN
  Administered 2018-01-16: 4 mg via INTRAVENOUS
  Filled 2018-01-16: qty 5

## 2018-01-16 MED ORDER — LACTATED RINGERS IV SOLN
1000.0000 mL | Freq: Once | INTRAVENOUS | Status: AC
  Administered 2018-01-16: 1000 mL via INTRAVENOUS

## 2018-01-16 MED ORDER — ROPIVACAINE HCL 2 MG/ML IJ SOLN
9.0000 mL | Freq: Once | INTRAMUSCULAR | Status: DC
  Filled 2018-01-16: qty 10

## 2018-01-16 MED ORDER — FENTANYL CITRATE (PF) 100 MCG/2ML IJ SOLN
25.0000 ug | INTRAMUSCULAR | Status: DC | PRN
  Administered 2018-01-16: 100 ug via INTRAVENOUS
  Filled 2018-01-16: qty 2

## 2018-01-16 MED ORDER — OXYCODONE-ACETAMINOPHEN 5-325 MG PO TABS: 1.0000 | ORAL_TABLET | Freq: Four times a day (QID) | ORAL | 0 refills | Status: AC | PRN

## 2018-01-16 MED ORDER — PREGABALIN 25 MG PO CAPS
25.0000 mg | ORAL_CAPSULE | Freq: Every day | ORAL | 5 refills | Status: DC
Start: 2018-02-02 — End: 2018-03-10

## 2018-01-16 MED ORDER — DEXAMETHASONE SODIUM PHOSPHATE 10 MG/ML IJ SOLN
10.0000 mg | Freq: Once | INTRAMUSCULAR | Status: AC
  Administered 2018-01-16: 10 mg
  Filled 2018-01-16: qty 1

## 2018-01-16 MED ORDER — LIDOCAINE HCL 2 % IJ SOLN
10.0000 mL | Freq: Once | INTRAMUSCULAR | Status: DC
  Filled 2018-01-16: qty 40

## 2018-01-16 MED ORDER — CYCLOBENZAPRINE HCL 5 MG PO TABS: 5.0000 mg | ORAL_TABLET | Freq: Two times a day (BID) | ORAL | 5 refills | Status: DC | PRN

## 2018-01-16 NOTE — Progress Notes (Signed)
Patient's Name: Teresa [name]  MRN: [id number]  Referring Provider: Delano Metz, MD  DOB: 1961/04/27  PCP: Lesli Albee, MD  DOS: 01/16/2018  Note by: Oswaldo Done, MD  Service setting: Ambulatory outpatient  Specialty: Interventional Pain Management  Patient type: Established  Location: [facility] (AMB) Pain Management Facility  Visit type: Interventional Procedure   Primary Reason for Visit: Interventional Pain Management Treatment. CC: Neck Pain (bilateral)  Procedure:  Anesthesia, Analgesia, Anxiolysis:  Type: Therapeutic Medial Branch Facet Radiofrequency Ablation Region: Posterolateral Cervical Level: C3, C4, C5, C6, & C7 Medial Branch Level(s) Laterality: Left-Sided Paraspinal  Type: Local Anesthesia with Moderate (Conscious) Sedation Local Anesthetic: Lidocaine 1% Route: Intravenous (IV) IV Access: Secured Sedation: Meaningful verbal contact was maintained at all times during the procedure  Indication(s): Analgesia and Anxiety   Indications: 1. Cervical facet syndrome (WC) (Bilateral) (L>R)   2. Cervical spondylosis (WC)   3. Chronic neck pain (WC) (Secondary Area of Pain) (Bilateral) (L>R)   4. Acute postoperative pain   5. Fibromyalgia   6. Muscle spasms of both lower extremities   7. Cervical facet syndrome   8. Cervical spondylosis without myelopathy   9. Chronic neck pain    Ms. Bramel has either failed to respond, was unable to tolerate, or simply did not get enough benefit from other more conservative therapies including, but not limited to: 1. Over-the-counter medications 2. Anti-inflammatory medications 3. Muscle relaxants 4. Membrane stabilizers 5. Opioids 6. Physical therapy 7. Modalities (Heat, ice, etc.) 8. Invasive techniques such as nerve blocks. Ms. Radke has attained more than 50% relief of the pain from a series of diagnostic injections conducted in separate occasions.  Pain Score: Pre-procedure: 3 /10 Post-procedure: (P) 3 /10  Pre-op  Assessment:  Ms. Henshaw is a 57 y.o. (year old), female patient, seen today for interventional treatment. She  has a past surgical history that includes Neck surgery; btl; and Tubal ligation (Bilateral). Ms. Icard has a current medication list which includes the following prescription(s): alprazolam, fluoxetine, pregabalin, prevident 5000 dry mouth, cyclobenzaprine, meloxicam, and oxycodone-acetaminophen, and the following Facility-Administered Medications: fentanyl, lidocaine, midazolam, and ropivacaine (pf) 2 mg/ml (0.2%). Her primarily concern today is the Neck Pain (bilateral)  Initial Vital Signs: Last menstrual period 09/05/2014. BMI: Estimated body mass index is 22.71 kg/m as calculated from the following:   Height as of this encounter: 5\' 7"  (1.702 m).   Weight as of this encounter: 145 lb (65.8 kg).  Risk Assessment: Allergies: Reviewed. She is allergic to eggs or egg-derived products.  Allergy Precautions: None required Coagulopathies: Reviewed. None identified.  Blood-thinner therapy: None at this time Active Infection(s): Reviewed. None identified. Ms. Rodger is afebrile  Site Confirmation: Ms. Jeon was asked to confirm the procedure and laterality before marking the site Procedure checklist: Completed Consent: Before the procedure and under the influence of no sedative(s), amnesic(s), or anxiolytics, the patient was informed of the treatment options, risks and possible complications. To fulfill our ethical and legal obligations, as recommended by the American Medical Association's Code of Ethics, I have informed the patient of my clinical impression; the nature and purpose of the treatment or procedure; the risks, benefits, and possible complications of the intervention; the alternatives, including doing nothing; the risk(s) and benefit(s) of the alternative treatment(s) or procedure(s); and the risk(s) and benefit(s) of doing nothing. The patient was provided information about the  general risks and possible complications associated with the procedure. These may include, but are not limited to: failure to achieve desired  goals, infection, bleeding, organ or nerve damage, allergic reactions, paralysis, and death. In addition, the patient was informed of those risks and complications associated to Spine-related procedures, such as failure to decrease pain; infection (i.e.: Meningitis, epidural or intraspinal abscess); bleeding (i.e.: epidural hematoma, subarachnoid hemorrhage, or any other type of intraspinal or peri-dural bleeding); organ or nerve damage (i.e.: Any type of peripheral nerve, nerve root, or spinal cord injury) with subsequent damage to sensory, motor, and/or autonomic systems, resulting in permanent pain, numbness, and/or weakness of one or several areas of the body; allergic reactions; (i.e.: anaphylactic reaction); and/or death. Furthermore, the patient was informed of those risks and complications associated with the medications. These include, but are not limited to: allergic reactions (i.e.: anaphylactic or anaphylactoid reaction(s)); adrenal axis suppression; blood sugar elevation that in diabetics may result in ketoacidosis or comma; water retention that in patients with history of congestive heart failure may result in shortness of breath, pulmonary edema, and decompensation with resultant heart failure; weight gain; swelling or edema; medication-induced neural toxicity; particulate matter embolism and blood vessel occlusion with resultant organ, and/or nervous system infarction; and/or aseptic necrosis of one or more joints. Finally, the patient was informed that Medicine is not an exact science; therefore, there is also the possibility of unforeseen or unpredictable risks and/or possible complications that may result in a catastrophic outcome. The patient indicated having understood very clearly. We have given the patient no guarantees and we have made no promises.  Enough time was given to the patient to ask questions, all of which were answered to the patient's satisfaction. Ms. Printz has indicated that she wanted to continue with the procedure. Attestation: I, the ordering provider, attest that I have discussed with the patient the benefits, risks, side-effects, alternatives, likelihood of achieving goals, and potential problems during recovery for the procedure that I have provided informed consent. Date: 01/16/2018; Time: 7:53 AM  Pre-Procedure Preparation:  Monitoring: As per clinic protocol. Respiration, ETCO2, SpO2, BP, heart rate and rhythm monitor placed and checked for adequate function Safety Precautions: Patient was assessed for positional comfort and pressure points before starting the procedure. Time-out: I initiated and conducted the "Time-out" before starting the procedure, as per protocol. The patient was asked to participate by confirming the accuracy of the "Time Out" information. Verification of the correct person, site, and procedure were performed and confirmed by me, the nursing staff, and the patient. "Time-out" conducted as per Joint Commission's Universal Protocol (UP.01.01.01). "Time-out" Date & Time: 01/16/2018; 1222 hrs.  Description of Procedure Process:   Position: Prone with head of the table was raised to facilitate breathing. Target Area: For Cervical Facet block(s), the target is the postero-lateral waist of the articular pillars at the C3, C4, C5, C6, & C7 levels. Approach: Posterior approach. Area Prepped: Entire Posterior Cervical Region Prepping solution: Hibiclens (4.0% Chlorhexidine gluconate solution) Safety Precautions: Aspiration looking for blood return was conducted prior to all injections. At no point did we inject any substances, as a needle was being advanced. Safe injection practices and needle disposal techniques used. Medications properly checked for expiration dates. SDV (single dose vial) medications  used. Description of the Procedure: Protocol guidelines were followed. The patient was placed in position over the procedure table. The target area was identified and the area prepped in the usual manner. The skin and muscle were infiltrated with local anesthetic. Appropriate amount of time allowed to pass for local anesthetics to take effect. Radiofrequency needles were introduced to the target area using  fluoroscopic guidance. Using the NeuroTherm NT1100 Radiofrequency Generator, sensory stimulation using 50 Hz was used to locate & identify the nerve, making sure that the needle was positioned such that there was no sensory stimulation below 0.3 V or above 0.7 V. Stimulation using 2 Hz was used to evaluate the motor component. Care was taken not to lesion any nerves that demonstrated motor stimulation of the lower extremities at an output of less than 2.5 times that of the sensory threshold, or a maximum of 2.0 V. Once satisfactory placement of the needles was achieved, the numbing solution was slowly injected after negative aspiration. After waiting for at least 2 minutes, the ablation was performed at 80 degrees C for 60 seconds, using regular Radiofrequency settings. Once the procedure was completed, the needles were then removed and the area cleansed, making sure to leave some of the prepping solution back to take advantage of its long term bactericidal properties. Intra-operative Compliance: Compliant Vitals:   01/16/18 1308 01/16/18 1318 01/16/18 1328 01/16/18 1338  BP: 135/82 138/74 132/81 (!) 142/82  Pulse:      Resp: 16 16 12 10   Temp: 98.4 F (36.9 C)   97.7 F (36.5 C)  TempSrc:      SpO2: 100% 100% 100% 100%  Weight:      Height:        Start Time: 1222 hrs. End Time: 1257 hrs. Materials & Medications:  Needle(s) Type: Teflon-coated, curved tip, Radiofrequency needle(s) Gauge: 22G Length: 10cm Medication(s): We administered midazolam, fentaNYL, lactated ringers, and dexamethasone.  Please see chart orders for dosing details.  Imaging Guidance (Spinal):  Type of Imaging Technique: Fluoroscopy Guidance (Spinal) Indication(s): Assistance in needle guidance and placement for procedures requiring needle placement in or near specific anatomical locations not easily accessible without such assistance. Exposure Time: Please see nurses notes. Contrast: None used. Fluoroscopic Guidance: I was personally present during the use of fluoroscopy. "Tunnel Vision Technique" used to obtain the best possible view of the target area. Parallax error corrected before commencing the procedure. "Direction-depth-direction" technique used to introduce the needle under continuous pulsed fluoroscopy. Once target was reached, antero-posterior, oblique, and lateral fluoroscopic projection used confirm needle placement in all planes. Images permanently stored in EMR. Interpretation: No contrast injected. I personally interpreted the imaging intraoperatively. Adequate needle placement confirmed in multiple planes. Permanent images saved into the patient's record.  Antibiotic Prophylaxis:  Indication(s): None identified Antibiotic given: None  Post-operative Assessment:  EBL: None Complications: No immediate post-treatment complications observed by team, or reported by patient. Note: The patient tolerated the entire procedure well. A repeat set of vitals were taken after the procedure and the patient was kept under observation following institutional policy, for this type of procedure. Post-procedural neurological assessment was performed, showing return to baseline, prior to discharge. The patient was provided with post-procedure discharge instructions, including a section on how to identify potential problems. Should any problems arise concerning this procedure, the patient was given instructions to immediately contact us, at any time, without hesitation. In any case, we plan to contact the patient by  telephone for a follow-up status report regarding this interventional procedure. Comments:  No additional relevant information.  Plan of Care    Imaging Orders     DG C-Arm 1-60 Min-No Report  Procedure Orders     Radiofrequency,Cervical     Radiofrequency,Cervical  Medications ordered for procedure: Meds ordered this encounter  Medications  . midazolam (VERSED) 5 MG/5ML injection 1-2 mg    Make sure  Flumazenil is available in the pyxis when using this medication. If oversedation occurs, administer 0.2 mg IV over 15 sec. If after 45 sec no response, administer 0.2 mg again over 1 min; may repeat at 1 min intervals; not to exceed 4 doses (1 mg)  . fentaNYL (SUBLIMAZE) injection 25-50 mcg    Make sure Narcan is available in the pyxis when using this medication. In the event of respiratory depression (RR< 8/min): Titrate NARCAN (naloxone) in increments of 0.1 to 0.2 mg IV at 2-3 minute intervals, until desired degree of reversal.  . lactated ringers infusion 1,000 mL  . lidocaine (XYLOCAINE) 2 % (with pres) injection 200 mg  . ropivacaine (PF) 2 mg/mL (0.2%) (NAROPIN) injection 9 mL  . dexamethasone (DECADRON) injection 10 mg  . oxyCODONE-acetaminophen (PERCOCET) 5-325 MG tablet    Sig: Take 1 tablet by mouth every 6 (six) hours as needed for up to 7 days for severe pain.    Dispense:  28 tablet    Refill:  0    For acute post-operative pain. Not to be refilled. To last 7 days.  . meloxicam (MOBIC) 15 MG tablet    Sig: Take 1 tablet (15 mg total) by mouth daily.    Dispense:  30 tablet    Refill:  5    Do not place medication on "Automatic Refill". Fill one day early if pharmacy is closed on scheduled refill date.  . cyclobenzaprine (FLEXERIL) 5 MG tablet    Sig: Take 1 tablet (5 mg total) by mouth 2 (two) times daily as needed for muscle spasms.    Dispense:  60 tablet    Refill:  5    Do not place this medication, or any other prescription from our practice, on "Automatic  Refill". Patient may have prescription filled one day early if pharmacy is closed on scheduled refill date.  . pregabalin (LYRICA) 25 MG capsule    Sig: Take 1 capsule (25 mg total) by mouth at bedtime.    Dispense:  30 capsule    Refill:  5    Do not place this medication, or any other prescription from our practice, on "Automatic Refill". Patient may have prescription filled one day early if pharmacy is closed on scheduled refill date.   Medications administered: We administered midazolam, fentaNYL, lactated ringers, and dexamethasone.  See the medical record for exact dosing, route, and time of administration.  New Prescriptions   OXYCODONE-ACETAMINOPHEN (PERCOCET) 5-325 MG TABLET    Take 1 tablet by mouth every 6 (six) hours as needed for up to 7 days for severe pain.   Disposition: Discharge home  Discharge Date & Time: 01/16/2018; 1342 hrs.   Physician-requested Follow-up: Return for contralateral RFA (2 wks).  Future Appointments  Date Time Provider Department Center  03/11/2018  6:30 PM [facility] None   Primary Care Physician: Lesli Albee, MD Location: [facility] Note by: Oswaldo Done, MD Date: 01/16/2018; Time: 1:54 PM  Disclaimer:  Medicine is not an Visual merchandiser. The only guarantee in medicine is that nothing is guaranteed. It is important to note that the decision to proceed with this intervention was based on the information collected from the patient. The Data and conclusions were drawn from the patient's questionnaire, the interview, and the physical examination. Because the information was provided in large part by the patient, it cannot be guaranteed that it has not been purposely or unconsciously manipulated. Every effort has been made  to obtain as much relevant data as possible for this evaluation. It is important to note that the conclusions that lead to this procedure are derived in large part from  the available data. Always take into account that the treatment will also be dependent on availability of resources and existing treatment guidelines, considered by other Pain Management Practitioners as being common knowledge and practice, at the time of the intervention. For Medico-Legal purposes, it is also important to point out that variation in procedural techniques and pharmacological choices are the acceptable norm. The indications, contraindications, technique, and results of the above procedure should only be interpreted and judged by a Board-Certified Interventional Pain Specialist with extensive familiarity and expertise in the same exact procedure and technique.

## 2018-01-16 NOTE — Patient Instructions (Signed)
____________________________________________________________________________________________  Post-Procedure instructions Instructions:  Apply ice: Fill a plastic sandwich bag with crushed ice. Cover it with a small towel and apply to injection site. Apply for 15 minutes then remove x 15 minutes. Repeat sequence on day of procedure, until you go to bed. The purpose is to minimize swelling and discomfort after procedure.  Apply heat: Apply heat to procedure site starting the day following the procedure. The purpose is to treat any soreness and discomfort from the procedure.  Food intake: Start with clear liquids (like water) and advance to regular food, as tolerated.   Physical activities: Keep activities to a minimum for the first 8 hours after the procedure.   Driving: If you have received any sedation, you are not allowed to drive for 24 hours after your procedure.  Blood thinner: Restart your blood thinner 6 hours after your procedure. (Only for those taking blood thinners)  Insulin: As soon as you can eat, you may resume your normal dosing schedule. (Only for those taking insulin)  Infection prevention: Keep procedure site clean and dry.  Post-procedure Pain Diary: Extremely important that this be done correctly and accurately. Recorded information will be used to determine the next step in treatment.  Pain evaluated is that of treated area only. Do not include pain from an untreated area.  Complete every hour, on the hour, for the initial 8 hours. Set an alarm to help you do this part accurately.  Do not go to sleep and have it completed later. It will not be accurate.  Follow-up appointment: Keep your follow-up appointment after the procedure. Usually 2 weeks for most procedures. (6 weeks in the case of radiofrequency.) Bring you pain diary.  Expect:  From numbing medicine (AKA: Local Anesthetics): Numbness or decrease in pain.  Onset: Full effect within 15 minutes of  injected.  Duration: It will depend on the type of local anesthetic used. On the average, 1 to 8 hours.   From steroids: Decrease in swelling or inflammation. Once inflammation is improved, relief of the pain will follow.  Onset of benefits: Depends on the amount of swelling present. The more swelling, the longer it will take for the benefits to be seen. In some cases, up to 10 days.  Duration: Steroids will stay in the system x 2 weeks. Duration of benefits will depend on multiple posibilities including persistent irritating factors.  From procedure: Some discomfort is to be expected once the numbing medicine wears off. This should be minimal if ice and heat are applied as instructed. Call if:  You experience numbness and weakness that gets worse with time, as opposed to wearing off.  New onset bowel or bladder incontinence. (Spinal procedures only)  Emergency Numbers:  Durning business hours (Monday - Thursday, 8:00 AM - 4:00 PM) (Friday, 9:00 AM - 12:00 Noon): [phone]  After hours: [phone] ____________________________________________________________________________________________  ____________________________________________________________________________________________  Preparing for Procedure with Sedation Instructions: . Oral Intake: Do not eat or drink anything for at least 8 hours prior to your procedure. . Transportation: Public transportation is not allowed. Bring an adult driver. The driver must be physically present in our waiting room before any procedure can be started. . Physical Assistance: Bring an adult physically capable of assisting you, in the event you need help. This adult should keep you company at home for at least 6 hours after the procedure. . Blood Pressure Medicine: Take your blood pressure medicine with a sip of water the morning of the procedure. . Blood   thinners:  . Diabetics on insulin: Notify the staff so that you can be scheduled  1st case in the morning. If your diabetes requires high dose insulin, take only  of your normal insulin dose the morning of the procedure and notify the staff that you have done so. . Preventing infections: Shower with an antibacterial soap the morning of your procedure. . Build-up your immune system: Take 1000 mg of Vitamin C with every meal (3 times a day) the day prior to your procedure. . Antibiotics: Inform the staff if you have a condition or reason that requires you to take antibiotics before dental procedures. . Pregnancy: If you are pregnant, call and cancel the procedure. . Sickness: If you have a cold, fever, or any active infections, call and cancel the procedure. . Arrival: You must be in the facility at least 30 minutes prior to your scheduled procedure. . Children: Do not bring children with you. . Dress appropriately: Bring dark clothing that you would not mind if they get stained. . Valuables: Do not bring any jewelry or valuables. Procedure appointments are reserved for interventional treatments only. . No Prescription Refills. . No medication changes will be discussed during procedure appointments. . No disability issues will be discussed. ____________________________________________________________________________________________   

## 2018-01-17 ENCOUNTER — Telehealth: Payer: Self-pay | Admitting: *Deleted

## 2018-01-17 NOTE — Telephone Encounter (Signed)
Voicemail left for patient to please call our office if there are questions or concerns re; procedure on yesterday.  

## 2018-01-23 ENCOUNTER — Telehealth: Payer: Self-pay | Admitting: Pain Medicine

## 2018-01-23 NOTE — Telephone Encounter (Signed)
Patient would like to speak with nurse about her RF please call

## 2018-01-24 NOTE — Telephone Encounter (Signed)
Had RF on 01-16-18. Continues to have pain. Patient advised that increased pain lasting few weeks is normal. Instructed to call if fever, shortness of breath.

## 2018-02-20 ENCOUNTER — Telehealth: Payer: Self-pay | Admitting: *Deleted

## 2018-02-20 ENCOUNTER — Encounter: Payer: Self-pay | Admitting: *Deleted

## 2018-03-03 ENCOUNTER — Ambulatory Visit: Payer: Self-pay | Admitting: Pain Medicine

## 2018-03-10 ENCOUNTER — Encounter: Payer: Self-pay | Admitting: Pain Medicine

## 2018-03-10 ENCOUNTER — Other Ambulatory Visit: Payer: Self-pay

## 2018-03-10 ENCOUNTER — Ambulatory Visit: Payer: Worker's Compensation | Attending: Pain Medicine | Admitting: Pain Medicine

## 2018-03-10 VITALS — BP 136/82 | HR 74 | Temp 98.2°F | Resp 16 | Ht 67.0 in | Wt 145.0 lb

## 2018-03-10 DIAGNOSIS — K219 Gastro-esophageal reflux disease without esophagitis: Secondary | ICD-10-CM | POA: Insufficient documentation

## 2018-03-10 DIAGNOSIS — M7918 Myalgia, other site: Secondary | ICD-10-CM | POA: Diagnosis not present

## 2018-03-10 DIAGNOSIS — F1320 Sedative, hypnotic or anxiolytic dependence, uncomplicated: Secondary | ICD-10-CM | POA: Insufficient documentation

## 2018-03-10 DIAGNOSIS — G8929 Other chronic pain: Secondary | ICD-10-CM | POA: Diagnosis not present

## 2018-03-10 DIAGNOSIS — M62838 Other muscle spasm: Secondary | ICD-10-CM

## 2018-03-10 DIAGNOSIS — M47812 Spondylosis without myelopathy or radiculopathy, cervical region: Secondary | ICD-10-CM | POA: Diagnosis not present

## 2018-03-10 DIAGNOSIS — M792 Neuralgia and neuritis, unspecified: Secondary | ICD-10-CM | POA: Diagnosis not present

## 2018-03-10 DIAGNOSIS — F17200 Nicotine dependence, unspecified, uncomplicated: Secondary | ICD-10-CM | POA: Insufficient documentation

## 2018-03-10 DIAGNOSIS — Z91012 Allergy to eggs: Secondary | ICD-10-CM | POA: Diagnosis not present

## 2018-03-10 DIAGNOSIS — Z9851 Tubal ligation status: Secondary | ICD-10-CM | POA: Insufficient documentation

## 2018-03-10 DIAGNOSIS — E559 Vitamin D deficiency, unspecified: Secondary | ICD-10-CM | POA: Insufficient documentation

## 2018-03-10 DIAGNOSIS — M542 Cervicalgia: Secondary | ICD-10-CM | POA: Diagnosis not present

## 2018-03-10 DIAGNOSIS — Z79899 Other long term (current) drug therapy: Secondary | ICD-10-CM | POA: Diagnosis not present

## 2018-03-10 MED ORDER — MAGNESIUM OXIDE -MG SUPPLEMENT 500 MG PO CAPS: 1.0000 | ORAL_CAPSULE | Freq: Two times a day (BID) | ORAL | 0 refills | Status: DC

## 2018-03-10 MED ORDER — DULOXETINE HCL 30 MG PO CPEP: 30.0000 mg | ORAL_CAPSULE | Freq: Every day | ORAL | 0 refills | Status: DC

## 2018-03-10 MED ORDER — ERGOCALCIFEROL 1.25 MG (50000 UT) PO CAPS: 50000.0000 [IU] | ORAL_CAPSULE | ORAL | 0 refills | Status: AC

## 2018-03-10 MED ORDER — PREGABALIN 50 MG PO CAPS: 50.0000 mg | ORAL_CAPSULE | Freq: Three times a day (TID) | ORAL | 0 refills | Status: DC

## 2018-03-10 NOTE — Progress Notes (Signed)
Patient's Name: Teresa [name]  MRN: [id number]  Referring Provider: Suzanna Obey, MD  DOB: [birth year]  PCP: Suzanna Obey, MD  DOS: 03/10/2018  Note by: Gaspar Cola, MD  Service setting: Ambulatory outpatient  Specialty: Interventional Pain Management  Location: [facility] (AMB) Pain Management Facility    Patient type: Established   Primary Reason(s) for Visit: Encounter for post-procedure evaluation of chronic illness with mild to moderate exacerbation CC: Headache (left)  HPI  Ms. Teresa [name] is a 57 y.o. year old, female patient, who comes today for a post-procedure evaluation. She has History of panic attacks; Encounter for therapeutic drug level monitoring; Benzodiazepine dependence ([place]); Chronic pain syndrome (WC); Chronic upper back pain (WC); Thoracic back pain (WC); Hand pain; H/O cervical spine surgery; Chronic knee pain (Bilateral); Chronic low back pain; Cervical spondylosis (WC); Generalized anxiety disorder; Cervical facet syndrome (WC) (Bilateral) (L>R); Diffuse arthralgia; Tobacco use disorder; Fibromyalgia;  History of anterior cervical discectomy and fusion (ACDF) (left side)  (WC); Cervical radicular pain (WC) (Primary Area of Pain) (Left); Healthcare maintenance; 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[facility]); Chronic neck pain (WC) (Secondary Area of Pain) (Bilateral) (L>R); Musculoskeletal pain; Muscle spasms of neck; Work related injury (DOI: 07/20/2010); Neurogenic pain; Neuropathic pain; Abnormal UDS (12/01/2015); Chronic hip pain (Bilateral); Chronic shoulder pain (Secondary Area of Pain) (Bilateral) (L>R); Muscle spasms of lower extremity; Disturbance of skin sensation (Left side of face); Depression; Weakness of left leg; Abnormal MRI, cervical spine (04/16/2017); GERD (gastroesophageal reflux disease); Cervicogenic headache (WC); Acute postoperative pain; Spondylosis without myelopathy or radiculopathy, cervical region; and Vitamin D insufficiency on their  problem list. Her primarily concern today is the Headache (left)  Pain Assessment: Location: Left Head Radiating: radiates down the entire left side of her body to thigh Onset: More than a month ago Duration: Chronic pain Quality: Stabbing, Aching, Constant, Discomfort Severity: 5 /10 (self-reported pain score)  Note: Reported level is inconsistent with clinical observations. Clinically the patient looks like a 2/10 A 2/10 is viewed as "Mild to Moderate" and described as noticeable and distracting. Impossible to hide from other people. More frequent flare-ups. Still possible to adapt and function close to normal. It can be very annoying and may have occasional stronger flare-ups. With discipline, patients may get used to it and adapt. Information on the proper use of the pain scale provided to the patient today. When using our objective Pain Scale, levels between 6 and 10/10 are said to belong in an emergency room, as it progressively worsens from a 6/10, described as severely limiting, requiring emergency care not usually available at an outpatient pain management facility. At a 6/10 level, communication becomes difficult and requires great effort. Assistance to reach the emergency department may be required. Facial flushing and profuse sweating along with potentially dangerous increases in heart rate and blood pressure will be evident. Timing: Constant Modifying factors: denies  [name] comes in today for post-procedure evaluation after the treatment done on 01/23/2018. Had a fall after procedure and she scrapped her left shin and knee. Has had a lot of neck pain after the procedure. Having problems with 2ry depression due to her pain and current status.   Feel tightness of left sternocleidomastoid muscle, PRN, mostly at night, but not daily. This also has her concern.   Further details on both, my assessment(s), as well as the proposed treatment plan, please see below.  Post-Procedure  Assessment  01/23/2018 Procedure: Therapeutic left-sided cervical facet RFA #1 under fluoroscopic guidance and IV  sedation Pre-procedure pain score:  3/10 Post-procedure pain score: 3/10         Influential Factors: BMI: 22.71 kg/m Intra-procedural challenges: None observed.         Assessment challenges: None detected.              Reported side-effects: None.        Post-procedural adverse reactions or complications: None reported         Sedation: Sedation provided. When no sedatives are used, the analgesic levels obtained are directly associated to the effectiveness of the local anesthetics. However, when sedation is provided, the level of analgesia obtained during the initial 1 hour following the intervention, is believed to be the result of a combination of factors. These factors may include, but are not limited to: 1. The effectiveness of the local anesthetics used. 2. The effects of the analgesic(s) and/or anxiolytic(s) used. 3. The degree of discomfort experienced by the patient at the time of the procedure. 4. The patients ability and reliability in recalling and recording the events. 5. The presence and influence of possible secondary gains and/or psychosocial factors. Reported result: Relief experienced during the 1st hour after the procedure: (Patient states the procedure was so horrific, she couldnt tell if the area was numb or if pain was gone) (Ultra-Short Term Relief)            Interpretative annotation: Clinically appropriate result. Analgesia during this period is likely to be Local Anesthetic and/or IV Sedative (Analgesic/Anxiolytic) related.          Effects of local anesthetic: The analgesic effects attained during this period are directly associated to the localized infiltration of local anesthetics and therefore cary significant diagnostic value as to the etiological location, or anatomical origin, of the pain. Expected duration of relief is directly dependent on the  pharmacodynamics of the local anesthetic used. Long-acting (4-6 hours) anesthetics used.  Reported result: Relief during the next 4 to 6 hour after the procedure: (same as above) (Short-Term Relief)            Interpretative annotation: Clinically appropriate result. Analgesia during this period is likely to be Local Anesthetic-related.          Long-term benefit: Defined as the period of time past the expected duration of local anesthetics (1 hour for short-acting and 4-6 hours for long-acting). With the possible exception of prolonged sympathetic blockade from the local anesthetics, benefits during this period are typically attributed to, or associated with, other factors such as analgesic sensory neuropraxia, antiinflammatory effects, or beneficial biochemical changes provided by agents other than the local anesthetics.  Reported result: Extended relief following procedure: (States it is hard for her to say at this time) (Long-Term Relief)            Interpretative annotation: Clinically appropriate result. Good relief. No permanent benefit expected. Inflammation plays a part in the etiology to the pain.          Current benefits: Defined as reported results that persistent at this point in time.   Analgesia: <50 %            Function: Somewhat improved ROM: Somewhat improved Interpretative annotation: Recurrence of symptoms. No permanent benefit expected. Effective diagnostic intervention.          Interpretation: Results would suggest a successful diagnostic intervention.                  Plan:  Please see "Plan of Care" for details.  Laboratory Chemistry  Inflammation Markers (CRP: Acute Phase) (ESR: Chronic Phase) Lab Results  Component Value Date   CRP <0.8 06/12/2017   ESRSEDRATE 6 06/12/2017                         Rheumatology Markers None reported  Renal Function Markers Lab Results  Component Value Date   BUN 21 09/05/2017   CREATININE 1.11 (H) 09/05/2017    GFRAA 64 09/05/2017   GFRNONAA 56 (L) 09/05/2017                 Hepatic Function Markers Lab Results  Component Value Date   AST 17 09/05/2017   ALT 14 09/05/2017   ALBUMIN 4.9 09/05/2017   ALKPHOS 72 09/05/2017                 Electrolytes Lab Results  Component Value Date   NA 143 09/05/2017   K 5.6 (H) 09/05/2017   CL 101 09/05/2017   CALCIUM 9.9 09/05/2017   MG 1.9 06/12/2017                        Neuropathy Markers Lab Results  Component Value Date   HGBA1C 5.2 09/05/2017                 Bone Pathology Markers Lab Results  Component Value Date   VD25OH 22.2 (L) 06/12/2017                         Coagulation Parameters Lab Results  Component Value Date   PLT 319 09/05/2017                 Cardiovascular Markers Lab Results  Component Value Date   HGB 14.4 09/05/2017   HCT 41.9 09/05/2017                 CA Markers None reported   Note: Lab results reviewed.  Recent Diagnostic Imaging Results  DG C-Arm 1-60 Min-No Report Fluoroscopy was utilized by the requesting physician.  No radiographic  interpretation.   Complexity Note: I personally reviewed the fluoroscopic imaging of the procedure.                        Meds   Current Outpatient Medications:  .  alprazolam (XANAX) 2 MG tablet, Take 2 mg by mouth 2 (two) times daily as needed. , Disp: , Rfl:  .  cyclobenzaprine (FLEXERIL) 5 MG tablet, Take 1 tablet (5 mg total) by mouth 2 (two) times daily as needed for muscle spasms., Disp: 60 tablet, Rfl: 5 .  meloxicam (MOBIC) 15 MG tablet, Take 1 tablet (15 mg total) by mouth daily., Disp: 30 tablet, Rfl: 5 .  PREVIDENT 5000 DRY MOUTH 1.1 % GEL dental gel, Take 1 application by mouth as needed., Disp: , Rfl: 9 .  DULoxetine (CYMBALTA) 30 MG capsule, Take 1 capsule (30 mg total) by mouth daily., Disp: 30 capsule, Rfl: 0 .  ergocalciferol (VITAMIN D2) 50000 units capsule, Take 1 capsule (50,000 Units total) by mouth 2 (two) times a week. X 6  weeks., Disp: 12 capsule, Rfl: 0 .  Magnesium Oxide 500 MG CAPS, Take 1 capsule (500 mg total) by mouth 2 (two) times daily at 8 am and 10 pm., Disp: 180 capsule, Rfl: 0 .  pregabalin (LYRICA) 50 MG capsule, Take 1 capsule (50 mg  total) by mouth 3 (three) times daily., Disp: 90 capsule, Rfl: 0  ROS  Constitutional: Denies any fever or chills Gastrointestinal: No reported hemesis, hematochezia, vomiting, or acute GI distress Musculoskeletal: Denies any acute onset joint swelling, redness, loss of ROM, or weakness Neurological: No reported episodes of acute onset apraxia, aphasia, dysarthria, agnosia, amnesia, paralysis, loss of coordination, or loss of consciousness  Allergies  Ms. Hattabaugh is allergic to eggs or egg-derived products.  [place]  Drug: Ms. Scarlett  reports that she does not use drugs. Alcohol:  reports that she does not drink alcohol. Tobacco:  reports that she has been smoking.  she has never used smokeless tobacco. Medical:  has a past medical history of  History of anterior cervical discectomy and fusion (ACDF) (left side). (10/18/2015), Bilateral hip pain (10/18/2015), Cervical spine pain (WC) (10/18/2015), Cervical spondylosis with myelopathy, Chronic cervical radiculopathy (WC) (10/18/2015), Chronic constipation, Chronic pain syndrome, Depression, Fibromyalgia, GERD (gastroesophageal reflux disease), Insomnia, Migraine, Neck pain, and Panic attack. Surgical: Ms. Dicola  has a past surgical history that includes Neck surgery; btl; and Tubal ligation (Bilateral). Family: family history is not on file.  Constitutional Exam  General appearance: Well nourished, well developed, and well hydrated. In no apparent acute distress Vitals:   03/10/18 1135  BP: 136/82  Pulse: 74  Resp: 16  Temp: 98.2 F (36.8 C)  SpO2: 100%  Weight: 145 lb (65.8 kg)  Height: 5' 7"  (1.702 m)   BMI Assessment: Estimated body mass index is 22.71 kg/m as calculated from the following:   Height as of  this encounter: 5' 7"  (1.702 m).   Weight as of this encounter: 145 lb (65.8 kg).  BMI interpretation table: BMI level Category Range association with higher incidence of chronic pain  <18 kg/m2 Underweight   18.5-24.9 kg/m2 Ideal body weight   25-29.9 kg/m2 Overweight Increased incidence by 20%  30-34.9 kg/m2 Obese (Class I) Increased incidence by 68%  35-39.9 kg/m2 Severe obesity (Class II) Increased incidence by 136%  >40 kg/m2 Extreme obesity (Class III) Increased incidence by 254%   BMI Readings from Last 4 Encounters:  03/10/18 22.71 kg/m  01/16/18 22.71 kg/m  10/30/17 22.71 kg/m  10/15/17 22.71 kg/m   Wt Readings from Last 4 Encounters:  03/10/18 145 lb (65.8 kg)  01/16/18 145 lb (65.8 kg)  10/30/17 145 lb (65.8 kg)  10/15/17 145 lb (65.8 kg)  Psych/Mental status: Alert, oriented x 3 (person, place, & time)       Eyes: PERLA Respiratory: No evidence of acute respiratory distress  Cervical Spine Area Exam  Skin & Axial Inspection: No masses, redness, edema, swelling, or associated skin lesions Alignment: Symmetrical Functional ROM: Unrestricted ROM      Stability: No instability detected Muscle Tone/Strength: Functionally intact. No obvious neuro-muscular anomalies detected. Sensory (Neurological): Unimpaired Palpation: No palpable anomalies              Upper Extremity (UE) Exam    Side: Right upper extremity  Side: Left upper extremity  Skin & Extremity Inspection: Skin color, temperature, and hair growth are WNL. No peripheral edema or cyanosis. No masses, redness, swelling, asymmetry, or associated skin lesions. No contractures.  Skin & Extremity Inspection: Skin color, temperature, and hair growth are WNL. No peripheral edema or cyanosis. No masses, redness, swelling, asymmetry, or associated skin lesions. No contractures.  Functional ROM: Unrestricted ROM          Functional ROM: Unrestricted ROM  Muscle Tone/Strength: Functionally intact. No obvious  neuro-muscular anomalies detected.  Muscle Tone/Strength: Functionally intact. No obvious neuro-muscular anomalies detected.  Sensory (Neurological): Unimpaired          Sensory (Neurological): Unimpaired          Palpation: No palpable anomalies              Palpation: No palpable anomalies              Specialized Test(s): Deferred         Specialized Test(s): Deferred          Thoracic Spine Area Exam  Skin & Axial Inspection: No masses, redness, or swelling Alignment: Symmetrical Functional ROM: Unrestricted ROM Stability: No instability detected Muscle Tone/Strength: Functionally intact. No obvious neuro-muscular anomalies detected. Sensory (Neurological): Unimpaired Muscle strength & Tone: No palpable anomalies  Lumbar Spine Area Exam  Skin & Axial Inspection: No masses, redness, or swelling Alignment: Symmetrical Functional ROM: Unrestricted ROM      Stability: No instability detected Muscle Tone/Strength: Functionally intact. No obvious neuro-muscular anomalies detected. Sensory (Neurological): Unimpaired Palpation: No palpable anomalies       Provocative Tests: Lumbar Hyperextension and rotation test: evaluation deferred today       Lumbar Lateral bending test: evaluation deferred today       Patrick's Maneuver: evaluation deferred today                    Gait & Posture Assessment  Ambulation: Unassisted Gait: Relatively normal for age and body habitus Posture: WNL   Lower Extremity Exam    Side: Right lower extremity  Side: Left lower extremity  Skin & Extremity Inspection: Skin color, temperature, and hair growth are WNL. No peripheral edema or cyanosis. No masses, redness, swelling, asymmetry, or associated skin lesions. No contractures.  Skin & Extremity Inspection: Skin color, temperature, and hair growth are WNL. No peripheral edema or cyanosis. No masses, redness, swelling, asymmetry, or associated skin lesions. No contractures.  Functional ROM: Unrestricted ROM           Functional ROM: Unrestricted ROM          Muscle Tone/Strength: Functionally intact. No obvious neuro-muscular anomalies detected.  Muscle Tone/Strength: Functionally intact. No obvious neuro-muscular anomalies detected.  Sensory (Neurological): Unimpaired  Sensory (Neurological): Unimpaired  Palpation: No palpable anomalies  Palpation: No palpable anomalies   Assessment  Primary Diagnosis & Pertinent Problem List: The primary encounter diagnosis was Chronic neck pain (WC) (Secondary Area of Pain) (Bilateral) (L>R). Diagnoses of Cervical facet syndrome (WC) (Bilateral) (L>R), Cervical spondylosis (WC), Spondylosis without myelopathy or radiculopathy, cervical region, Benzodiazepine dependence (HCC), Neurogenic pain, Neuropathic pain, Gastroesophageal reflux disease without esophagitis, Vitamin D insufficiency, Musculoskeletal pain, and Muscle spasms of neck were also pertinent to this visit.  Status Diagnosis  Controlled Controlled Controlled 1. Chronic neck pain (WC) (Secondary Area of Pain) (Bilateral) (L>R)   2. Cervical facet syndrome (WC) (Bilateral) (L>R)   3. Cervical spondylosis (WC)   4. Spondylosis without myelopathy or radiculopathy, cervical region   5. Benzodiazepine dependence ([place])   6. Neurogenic pain   7. Neuropathic pain   8. Gastroesophageal reflux disease without esophagitis   9. Vitamin D insufficiency   10. Musculoskeletal pain   11. Muscle spasms of neck     Problems updated and reviewed during this visit: No problems updated. Plan of Care  Pharmacotherapy (Medications Ordered): Meds ordered this encounter  Medications  . pregabalin (LYRICA) 50 MG capsule  Sig: Take 1 capsule (50 mg total) by mouth 3 (three) times daily.    Dispense:  90 capsule    Refill:  0    Do not place this medication, or any other prescription from our practice, on "Automatic Refill". Patient may have prescription filled one day early if pharmacy is closed on scheduled refill  date.  . DULoxetine (CYMBALTA) 30 MG capsule    Sig: Take 1 capsule (30 mg total) by mouth daily.    Dispense:  30 capsule    Refill:  0    Do not place medication on "Automatic Refill". Fill one day early if pharmacy is closed on scheduled refill date.  . Magnesium Oxide 500 MG CAPS    Sig: Take 1 capsule (500 mg total) by mouth 2 (two) times daily at 8 am and 10 pm.    Dispense:  180 capsule    Refill:  0    Do not place medication on "Automatic Refill". Fill one day early if pharmacy is closed on scheduled refill date.  . ergocalciferol (VITAMIN D2) 50000 units capsule    Sig: Take 1 capsule (50,000 Units total) by mouth 2 (two) times a week. X 6 weeks.    Dispense:  12 capsule    Refill:  0    Do not add this medication to the electronic "Automatic Refill" notification system. Patient may have prescription filled one day early if pharmacy is closed on scheduled refill date.   Medications administered today: Epsie Walthall. Ferch had no medications administered during this visit.  Procedure Orders    No procedure(s) ordered today   Lab Orders  No laboratory test(s) ordered today   Imaging Orders  No imaging studies ordered today   Referral Orders  No referral(s) requested today    Interventional management options: Planned, scheduled, and/or pending:   None at this point, still having some post-procedure pain.    Considering:   Therapeutic right-sided cervical facet RFA under fluoroscopic guidance and IV sedation.  Diagnostic bilateralCervical FacetBlock #3 Possible bilateral cervical facet RFA (Starting with the left side) Palliative Left CESI under fluoro and IV sedation.   Palliative PRN treatment(s):   Palliative left cervical epidural steroid injection   Provider-requested follow-up: Return in about 2 weeks (around 03/24/2018) for Med-Mgmt, w/ Dr. Dossie Arbour.  Future Appointments  Date Time Provider [place]  03/24/2018  1:30 PM Milinda Pointer, MD  [facility] None  03/25/2018  6:00 PM [facility] [place] [facility] None   Primary Care Physician: Suzanna Obey, MD Location: [facility] Note by: Gaspar Cola, MD Date: 03/10/2018; Time: 1:04 PM

## 2018-03-10 NOTE — Progress Notes (Signed)
Safety precautions to be maintained throughout the outpatient stay will include: orient to surroundings, keep bed in low position, maintain call bell within reach at all times, provide assistance with transfer out of bed and ambulation.  

## 2018-03-11 ENCOUNTER — Telehealth: Payer: Self-pay | Admitting: Pain Medicine

## 2018-03-11 ENCOUNTER — Ambulatory Visit: Payer: Self-pay

## 2018-03-11 NOTE — Telephone Encounter (Signed)
Patient lvmail at 2:58 Mon 03-10-18 asking if she could get a script for something like lidocaine patches called in. Please contact patient to discuss

## 2018-03-13 NOTE — Telephone Encounter (Signed)
Left voicemail with patient that we are unable to phone in any Rx's and this is something that she will need to discuss with Thad Rangerrystal King, N.P. Or Dr Laban EmperorNaveira at her next appt or we could schedule something sooner if she needs it.

## 2018-03-18 ENCOUNTER — Ambulatory Visit: Payer: Self-pay

## 2018-03-24 ENCOUNTER — Encounter: Payer: Self-pay | Admitting: Pain Medicine

## 2018-03-24 DIAGNOSIS — M542 Cervicalgia: Secondary | ICD-10-CM | POA: Insufficient documentation

## 2018-03-24 DIAGNOSIS — M4802 Spinal stenosis, cervical region: Secondary | ICD-10-CM | POA: Insufficient documentation

## 2018-03-24 DIAGNOSIS — M5030 Other cervical disc degeneration, unspecified cervical region: Secondary | ICD-10-CM | POA: Insufficient documentation

## 2018-03-24 NOTE — Progress Notes (Deleted)
Patient's Name: Teresa [name]  MRN: [id number]  Referring Provider: Suzanna Obey, MD  DOB: April 06, 1961  PCP: Suzanna Obey, MD  DOS: 03/24/2018  Note by: Gaspar Cola, MD  Service setting: Ambulatory outpatient  Specialty: Interventional Pain Management  Location: [facility] (AMB) Pain Management Facility    Patient type: Established   Primary Reason(s) for Visit: Evaluation of chronic illnesses with exacerbation, or progression (Level of risk: moderate) CC: No chief complaint on file.  HPI  Teresa [name] is a 57 y.o. year old, female patient, who comes today for a follow-up evaluation. She has History of panic attacks; Encounter for therapeutic drug level monitoring; Benzodiazepine dependence ([place]); Chronic pain syndrome (WC); Chronic upper back pain (WC); Thoracic back pain (WC); Hand pain; H/O cervical spine surgery; Chronic knee pain (Bilateral); Chronic low back pain; Cervical spondylosis (WC); Generalized anxiety disorder; Cervical facet syndrome (WC) (Bilateral) (L>R); Diffuse arthralgia; Tobacco use disorder; Fibromyalgia;  History of anterior cervical discectomy and fusion (ACDF) (left side)  (WC); Cervical radicular pain (WC) (Primary Area of Pain) (Left); Healthcare maintenance; 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[facility]); Chronic neck pain (WC) (Secondary Area of Pain) (Bilateral) (L>R); Musculoskeletal pain; Muscle spasms of neck; Work related injury (DOI: 07/20/2010); Neurogenic pain; Neuropathic pain; Abnormal UDS (12/01/2015); Chronic hip pain (Bilateral); Chronic shoulder pain (Secondary Area of Pain) (Bilateral) (L>R); Muscle spasms of lower extremity; Disturbance of skin sensation (Left side of face); Depression; Weakness of left leg; Abnormal MRI, cervical spine (04/16/2017); GERD (gastroesophageal reflux disease); Cervicogenic headache (WC); Acute postoperative pain; Spondylosis without myelopathy or radiculopathy, cervical region; Vitamin D insufficiency; Cervicalgia;  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 Teresa [name] was last seen on 03/11/2018. Her primarily concern today is the No chief complaint on file.  Pain Assessment: Location:     Radiating:   Onset:   Duration:   Quality:   Severity:  /10 (self-reported pain score)  Note: Reported level is compatible with observation.                         When using our objective Pain Scale, levels between 6 and 10/10 are said to belong in an emergency room, as it progressively worsens from a 6/10, described as severely limiting, requiring emergency care not usually available at an outpatient pain management facility. At a 6/10 level, communication becomes difficult and requires great effort. Assistance to reach the emergency department may be required. Facial flushing and profuse sweating along with potentially dangerous increases in heart rate and blood pressure will be evident. Effect on ADL:   Timing:   Modifying factors:    The patient returns today for reevaluation of her neck pain.  She had a left-sided facet Cervical RFA with a rather bumpy postoperative.  Where she experienced a significant amount of pain.  Apparently she did not read the postprocedure information informing her that this was normal and therefore it apparently created a significant amount of anxiety, thinking that something had gone wrong.  In any case, the patient was started on a combination of medications that should help with her remainder pain.  Were hoping that once things settle down, then she should enjoy the benefit from the treatment.  She still has to have the right side done as well.  Further details on both, my assessment(s), as well as the proposed treatment plan, please see below.  Laboratory Chemistry  Inflammation Markers (CRP: Acute  Phase) (ESR: Chronic Phase) Lab Results  Component Value Date   CRP <0.8 06/12/2017   ESRSEDRATE 6 06/12/2017                          Renal Function Markers Lab Results  Component Value Date   BUN 21 09/05/2017   CREATININE 1.11 (H) 09/05/2017   GFRAA 64 09/05/2017   GFRNONAA 56 (L) 09/05/2017                 Hepatic Function Markers Lab Results  Component Value Date   AST 17 09/05/2017   ALT 14 09/05/2017   ALBUMIN 4.9 09/05/2017   ALKPHOS 72 09/05/2017                 Electrolytes Lab Results  Component Value Date   NA 143 09/05/2017   K 5.6 (H) 09/05/2017   CL 101 09/05/2017   CALCIUM 9.9 09/05/2017   MG 1.9 06/12/2017                        Neuropathy Markers Lab Results  Component Value Date   HGBA1C 5.2 09/05/2017                 Bone Pathology Markers Lab Results  Component Value Date   VD25OH 22.2 (L) 06/12/2017                         Coagulation Parameters Lab Results  Component Value Date   PLT 319 09/05/2017                 Cardiovascular Markers Lab Results  Component Value Date   HGB 14.4 09/05/2017   HCT 41.9 09/05/2017                 Note: Lab results reviewed.  Recent Diagnostic Imaging Review  Cervical Imaging: Cervical MR wo contrast:  Results for orders placed in visit on 04/16/17  MR CERVICAL SPINE WO CONTRAST   Complexity Note: Imaging results reviewed. Results shared with Teresa [name], using Layman's terms.                         Meds   Current Outpatient Medications:  .  alprazolam (XANAX) 2 MG tablet, Take 2 mg by mouth 2 (two) times daily as needed. , Disp: , Rfl:  .  cyclobenzaprine (FLEXERIL) 5 MG tablet, Take 1 tablet (5 mg total) by mouth 2 (two) times daily as needed for muscle spasms., Disp: 60 tablet, Rfl: 5 .  DULoxetine (CYMBALTA) 30 MG capsule, Take 1 capsule (30 mg total) by mouth daily., Disp: 30 capsule, Rfl: 0 .  ergocalciferol (VITAMIN D2) 50000 units capsule, Take 1 capsule (50,000 Units total) by mouth 2 (two) times a week. X 6 weeks., Disp: 12 capsule, Rfl: 0 .  Magnesium Oxide 500 MG CAPS, Take 1 capsule (500 mg  total) by mouth 2 (two) times daily at 8 am and 10 pm., Disp: 180 capsule, Rfl: 0 .  meloxicam (MOBIC) 15 MG tablet, Take 1 tablet (15 mg total) by mouth daily., Disp: 30 tablet, Rfl: 5 .  pregabalin (LYRICA) 50 MG capsule, Take 1 capsule (50 mg total) by mouth 3 (three) times daily., Disp: 90 capsule, Rfl: 0 .  PREVIDENT 5000 DRY MOUTH 1.1 % GEL dental gel, Take 1 application by mouth as needed., Disp: , Rfl:  9  ROS  Constitutional: Denies any fever or chills Gastrointestinal: No reported hemesis, hematochezia, vomiting, or acute GI distress Musculoskeletal: Denies any acute onset joint swelling, redness, loss of ROM, or weakness Neurological: No reported episodes of acute onset apraxia, aphasia, dysarthria, agnosia, amnesia, paralysis, loss of coordination, or loss of consciousness  Allergies  Ms. Buske is allergic to eggs or egg-derived products.  [place]  Drug: Ms. Freiberger  reports that she does not use drugs. Alcohol:  reports that she does not drink alcohol. Tobacco:  reports that she has been smoking.  She has never used smokeless tobacco. Medical:  has a past medical history of  History of anterior cervical discectomy and fusion (ACDF) (left side). (10/18/2015), Bilateral hip pain (10/18/2015), Cervical spine pain (WC) (10/18/2015), Cervical spondylosis with myelopathy, Chronic cervical radiculopathy (WC) (10/18/2015), Chronic constipation, Chronic pain syndrome, Depression, Fibromyalgia, GERD (gastroesophageal reflux disease), Insomnia, Migraine, Neck pain, and Panic attack. Surgical: Ms. Purdie  has a past surgical history that includes Neck surgery; btl; and Tubal ligation (Bilateral). Family: family history is not on file.  Constitutional Exam  General appearance: Well nourished, well developed, and well hydrated. In no apparent acute distress There were no vitals filed for this visit. BMI Assessment: Estimated body mass index is 22.71 kg/m as calculated from the following:   Height  as of 03/10/18: 5' 7" (1.702 m).   Weight as of 03/10/18: 145 lb (65.8 kg).  BMI interpretation table: BMI level Category Range association with higher incidence of chronic pain  <18 kg/m2 Underweight   18.5-24.9 kg/m2 Ideal body weight   25-29.9 kg/m2 Overweight Increased incidence by 20%  30-34.9 kg/m2 Obese (Class I) Increased incidence by 68%  35-39.9 kg/m2 Severe obesity (Class II) Increased incidence by 136%  >40 kg/m2 Extreme obesity (Class III) Increased incidence by 254%   BMI Readings from Last 4 Encounters:  03/10/18 22.71 kg/m  01/16/18 22.71 kg/m  10/30/17 22.71 kg/m  10/15/17 22.71 kg/m   Wt Readings from Last 4 Encounters:  03/10/18 145 lb (65.8 kg)  01/16/18 145 lb (65.8 kg)  10/30/17 145 lb (65.8 kg)  10/15/17 145 lb (65.8 kg)  Psych/Mental status: Alert, oriented x 3 (person, place, & time)       Eyes: PERLA Respiratory: No evidence of acute respiratory distress  Cervical Spine Area Exam  Skin & Axial Inspection: No masses, redness, edema, swelling, or associated skin lesions Alignment: Symmetrical Functional ROM: Unrestricted ROM      Stability: No instability detected Muscle Tone/Strength: Functionally intact. No obvious neuro-muscular anomalies detected. Sensory (Neurological): Unimpaired Palpation: No palpable anomalies              Upper Extremity (UE) Exam    Side: Right upper extremity  Side: Left upper extremity  Skin & Extremity Inspection: Skin color, temperature, and hair growth are WNL. No peripheral edema or cyanosis. No masses, redness, swelling, asymmetry, or associated skin lesions. No contractures.  Skin & Extremity Inspection: Skin color, temperature, and hair growth are WNL. No peripheral edema or cyanosis. No masses, redness, swelling, asymmetry, or associated skin lesions. No contractures.  Functional ROM: Unrestricted ROM          Functional ROM: Unrestricted ROM          Muscle Tone/Strength: Functionally intact. No obvious  neuro-muscular anomalies detected.  Muscle Tone/Strength: Functionally intact. No obvious neuro-muscular anomalies detected.  Sensory (Neurological): Unimpaired          Sensory (Neurological): Unimpaired  Palpation: No palpable anomalies              Palpation: No palpable anomalies              Specialized Test(s): Deferred         Specialized Test(s): Deferred          Thoracic Spine Area Exam  Skin & Axial Inspection: No masses, redness, or swelling Alignment: Symmetrical Functional ROM: Unrestricted ROM Stability: No instability detected Muscle Tone/Strength: Functionally intact. No obvious neuro-muscular anomalies detected. Sensory (Neurological): Unimpaired Muscle strength & Tone: No palpable anomalies  Lumbar Spine Area Exam  Skin & Axial Inspection: No masses, redness, or swelling Alignment: Symmetrical Functional ROM: Unrestricted ROM      Stability: No instability detected Muscle Tone/Strength: Functionally intact. No obvious neuro-muscular anomalies detected. Sensory (Neurological): Unimpaired Palpation: No palpable anomalies       Provocative Tests: Lumbar Hyperextension and rotation test: evaluation deferred today       Lumbar Lateral bending test: evaluation deferred today       Patrick's Maneuver: evaluation deferred today                    Gait & Posture Assessment  Ambulation: Unassisted Gait: Relatively normal for age and body habitus Posture: WNL   Lower Extremity Exam    Side: Right lower extremity  Side: Left lower extremity  Skin & Extremity Inspection: Skin color, temperature, and hair growth are WNL. No peripheral edema or cyanosis. No masses, redness, swelling, asymmetry, or associated skin lesions. No contractures.  Skin & Extremity Inspection: Skin color, temperature, and hair growth are WNL. No peripheral edema or cyanosis. No masses, redness, swelling, asymmetry, or associated skin lesions. No contractures.  Functional ROM: Unrestricted ROM           Functional ROM: Unrestricted ROM          Muscle Tone/Strength: Functionally intact. No obvious neuro-muscular anomalies detected.  Muscle Tone/Strength: Functionally intact. No obvious neuro-muscular anomalies detected.  Sensory (Neurological): Unimpaired  Sensory (Neurological): Unimpaired  Palpation: No palpable anomalies  Palpation: No palpable anomalies   Assessment  Primary Diagnosis & Pertinent Problem List: The primary encounter diagnosis was Spondylosis without myelopathy or radiculopathy, cervical region. 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  Status Diagnosis  Controlled Controlled Controlled 1. Spondylosis without myelopathy or radiculopathy, cervical region   2. Cervical facet syndrome (WC) (Bilateral) (L>R)   3. Cervicalgia   4. Cervical spondylosis (WC)   5. 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   Ddd (Degenerative Disc Disease), Cervical  Cervical spondylosis (WC)   C3-4: Disc space narrowing.  Central disc protrusion.  Facet arthropathy and ligamentum flavum hypertrophy.  Bilateral uncinate spurring.  Mild stenosis.  Worsened since 2011.  Bilateral C4 foraminal narrowing. C4-5, C5-6, C6-7: Anterior arthrodesis. C4-7 ACDF.    Plan of Care  Pharmacotherapy (Medications Ordered): No orders of the defined types were placed in this encounter.  Medications administered today: Braylinn Gulden. Welcome had no medications administered during this visit.  Procedure Orders    No procedure(s) ordered today   Lab Orders  No laboratory test(s) ordered today   Imaging  Orders  No imaging studies ordered today   Referral Orders  No referral(s) requested today    Interventional management options: Planned, scheduled, and/or pending:   Diagnostic left-sided cervical epidural steroid injection #2    Considering:   Therapeutic right-sided cervical facet RFA Diagnostic bilateralCervical FacetBlock #3 Possible bilateral cervical facet RFA(Starting with the left side) Palliative Left CESI under fluoro and IV sedation.   Palliative PRN treatment(s):   Palliative left cervical epidural steroid injection   Provider-requested follow-up: No follow-ups on file.  Future Appointments  Date Time Provider [place]  03/24/2018  1:30 PM Milinda Pointer, MD [facility] None  03/25/2018  6:00 PM [facility] [place] [facility] None   Primary Care Physician: Suzanna Obey, MD Location: [facility] Note by: Gaspar Cola, MD Date: 03/24/2018; Time: 8:19 AM

## 2018-03-25 ENCOUNTER — Ambulatory Visit: Payer: Self-pay | Admitting: Family Medicine

## 2018-03-25 ENCOUNTER — Other Ambulatory Visit: Payer: Self-pay

## 2018-03-25 DIAGNOSIS — Z0000 Encounter for general adult medical examination without abnormal findings: Secondary | ICD-10-CM

## 2018-03-25 NOTE — Progress Notes (Signed)
Patient: Teresa [name] Female    DOB: 1961/07/11   57 y.o.   MRN: [id number] Visit Date: 03/25/2018  Today's Provider: [facility]   Chief Complaint  Patient presents with  . Follow-up   Subjective:    Teresa [name] is a 57 y/o F here for 6 month f/u for labs  HLD Elevated lipids 6 months. Pt reports diet okay: fruits, red meat. Little exercise. No chest pain.  Cough Scratchy throat, onset today. No fever. No productive cough. No flu shot this year because of egg allergy. No sick contacts. No allergies. No reflux. No SOB.   Vision changes Pt reports seeing "double" over past year. Letters seem to have "shadow on them." Pt endorses increased glare and bothered more by lights. Has been getting steroid shots for chronic pain, but last A1c wnl. Doesn't believe close vision is affected. Last vision check 2 years ago.  Health maintenance Did not go to mammogram, pap smear. Would like another referral  Tobacco use 1-4 cigarettes/day. Interested in quitting.       Allergies  Allergen Reactions  . Eggs Or Egg-Derived Products Nausea And Vomiting   Previous Medications   ALPRAZOLAM (XANAX) 2 MG TABLET    Take 2 mg by mouth 2 (two) times daily as needed.    CYCLOBENZAPRINE (FLEXERIL) 5 MG TABLET    Take 1 tablet (5 mg total) by mouth 2 (two) times daily as needed for muscle spasms.   DULOXETINE (CYMBALTA) 30 MG CAPSULE    Take 1 capsule (30 mg total) by mouth daily.   ERGOCALCIFEROL (VITAMIN D2) 50000 UNITS CAPSULE    Take 1 capsule (50,000 Units total) by mouth 2 (two) times a week. X 6 weeks.   MAGNESIUM OXIDE 500 MG CAPS    Take 1 capsule (500 mg total) by mouth 2 (two) times daily at 8 am and 10 pm.   MELOXICAM (MOBIC) 15 MG TABLET    Take 1 tablet (15 mg total) by mouth daily.   PREGABALIN (LYRICA) 50 MG CAPSULE    Take 1 capsule (50 mg total) by mouth 3 (three) times daily.   PREVIDENT 5000 DRY MOUTH 1.1 % GEL DENTAL GEL    Take 1 application by mouth as needed.     Review of Systems  All other systems reviewed and are negative.   Social History   Tobacco Use  . Smoking status: Current Some Day Smoker  . Smokeless tobacco: Never Used  . Tobacco comment: Pt states she quit but started back a couple of months ago  Substance Use Topics  . Alcohol use: No    Alcohol/week: 0.0 oz   Objective:   BP 121/85   Pulse 80   Wt 150 lb 4.8 oz (68.2 kg)   LMP 09/05/2014 (Within Years)   BMI 23.54 kg/m   Physical Exam  Constitutional: She is oriented to person, place, and time. She appears well-developed and well-nourished.  HENT:  Head: Normocephalic.  Eyes: Conjunctivae are normal.  Neck: Neck supple.  Cardiovascular: Normal rate, regular rhythm and normal heart sounds.  Pulmonary/Chest: Effort normal and breath sounds normal. No respiratory distress.  Musculoskeletal: Normal range of motion.  Neurological: She is alert and oriented to person, place, and time.  Skin: Skin is warm and dry.        Assessment & Plan:     Teresa [name] is a 57 y/o F here for 6 month f/u for labs  HLD Elevated on labs. Pt to work  on diet/exercise. ASCVD score 4.9%, no indication for statin. F/u in 6 months.  Cough Likely cold. Recommended symptomatic tx.   Vision changes Concern for possible cataracts. Refer to ophtho  Tobacco cessation Interested in quitting. Cessation information provided to patient.   Health maintenance Will resend referral for pap and mammogram.       [facility]   [facility]

## 2018-03-27 ENCOUNTER — Ambulatory Visit: Payer: Self-pay | Admitting: Ophthalmology

## 2018-04-16 ENCOUNTER — Ambulatory Visit: Payer: Worker's Compensation | Attending: Pain Medicine | Admitting: Pain Medicine

## 2018-04-16 NOTE — Progress Notes (Deleted)
Patient's Name: Teresa [name]  MRN: [id number]  Referring Provider: Suzanna Obey, MD  DOB: 08-Dec-1961  PCP: Suzanna Obey, MD  DOS: 04/16/2018  Note by: Gaspar Cola, MD  Service setting: Ambulatory outpatient  Specialty: Interventional Pain Management  Location: [facility] (AMB) Pain Management Facility    Patient type: Established   Primary Reason(s) for Visit: Evaluation of chronic illnesses with exacerbation, or progression (Level of risk: moderate) CC: No chief complaint on file.  HPI  Teresa [name] is a 57 y.o. year old, female patient, who comes today for a follow-up evaluation. She has History of panic attacks; Encounter for therapeutic drug level monitoring; Benzodiazepine dependence ([place]); Chronic pain syndrome (WC); Chronic upper back pain (WC); Thoracic back pain (WC); Hand pain; H/O cervical spine surgery; Chronic knee pain (Bilateral); Chronic low back pain; Cervical spondylosis (WC); Generalized anxiety disorder; Cervical facet syndrome (WC) (Bilateral) (L>R); Diffuse arthralgia; Tobacco use disorder; Fibromyalgia;  History of anterior cervical discectomy and fusion (ACDF) (left side)  (WC); Cervical radicular pain (WC) (Primary Area of Pain) (Left); Healthcare maintenance; 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[facility]); Chronic neck pain (WC) (Secondary Area of Pain) (Bilateral) (L>R); Musculoskeletal pain; Muscle spasms of neck; Work related injury (DOI: 07/20/2010); Neurogenic pain; Neuropathic pain; Abnormal UDS (12/01/2015); Chronic hip pain (Bilateral); Chronic shoulder pain (Secondary Area of Pain) (Bilateral) (L>R); Muscle spasms of lower extremity; Disturbance of skin sensation (Left side of face); Depression; Weakness of left leg; Abnormal MRI, cervical spine (04/16/2017); GERD (gastroesophageal reflux disease); Cervicogenic headache (WC); Acute postoperative pain; Spondylosis without myelopathy or radiculopathy, cervical region; Vitamin D insufficiency; Cervicalgia;  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 Teresa [name] was last seen on 03/11/2018. Her primarily concern today is the No chief complaint on file.  Pain Assessment: Location:     Radiating:   Onset:   Duration:   Quality:   Severity:  /10 (self-reported pain score)  Note: Reported level is compatible with observation.                         When using our objective Pain Scale, levels between 6 and 10/10 are said to belong in an emergency room, as it progressively worsens from a 6/10, described as severely limiting, requiring emergency care not usually available at an outpatient pain management facility. At a 6/10 level, communication becomes difficult and requires great effort. Assistance to reach the emergency department may be required. Facial flushing and profuse sweating along with potentially dangerous increases in heart rate and blood pressure will be evident. Effect on ADL:   Timing:   Modifying factors:    Further details on both, my assessment(s), as well as the proposed treatment plan, please see below.  Laboratory Chemistry  Inflammation Markers (CRP: Acute Phase) (ESR: Chronic Phase) Lab Results  Component Value Date   CRP <0.8 06/12/2017   ESRSEDRATE 6 06/12/2017                         Rheumatology Markers No results found for: RF, ANA, LABURIC, URICUR, LYMEIGGIGMAB, [facility]                      Renal Function Markers Lab Results  Component Value Date   BUN 21 09/05/2017   CREATININE 1.11 (H) 09/05/2017   GFRAA 64 09/05/2017   GFRNONAA 56 (L) 09/05/2017  Hepatic Function Markers Lab Results  Component Value Date   AST 17 09/05/2017   ALT 14 09/05/2017   ALBUMIN 4.9 09/05/2017   ALKPHOS 72 09/05/2017                        Electrolytes Lab Results  Component Value Date   NA 143 09/05/2017   K 5.6 (H) 09/05/2017   CL 101 09/05/2017   CALCIUM 9.9  09/05/2017   MG 1.9 06/12/2017                        Neuropathy Markers Lab Results  Component Value Date   HGBA1C 5.2 09/05/2017                        Bone Pathology Markers Lab Results  Component Value Date   VD25OH 22.2 (L) 06/12/2017                         Coagulation Parameters Lab Results  Component Value Date   PLT 319 09/05/2017                        Cardiovascular Markers Lab Results  Component Value Date   HGB 14.4 09/05/2017   HCT 41.9 09/05/2017                         CA Markers No results found for: CEA, CA125, LABCA2                      Note: Lab results reviewed.  Recent Diagnostic Imaging Review  Cervical Imaging: Cervical MR wo contrast:  Results for orders placed in visit on 04/16/17  MR CERVICAL SPINE WO CONTRAST   Cervical MR wo contrast: No results found for this or any previous visit. Cervical MR w/wo contrast: No results found for this or any previous visit. Cervical MR w contrast: No results found for this or any previous visit. Cervical CT wo contrast: No results found for this or any previous visit. Cervical CT w/wo contrast: No results found for this or any previous visit. Cervical CT w/wo contrast: No results found for this or any previous visit. Cervical CT w contrast: No results found for this or any previous visit. Cervical CT outside: No results found for this or any previous visit. Cervical DG 1 view: No results found for this or any previous visit. Cervical DG 2-3 views: No results found for this or any previous visit. Cervical DG F/E views: No results found for this or any previous visit. Cervical DG 2-3 clearing views: No results found for this or any previous visit. Cervical DG Bending/F/E views: No results found for this or any previous visit. Cervical DG complete: No results found for this or any previous visit. Cervical DG Myelogram views: No results found for this or any previous visit. Cervical DG Myelogram views:  No results found for this or any previous visit. Cervical Discogram views: No results found for this or any previous visit.  Shoulder Imaging: Shoulder-R MR w contrast: No results found for this or any previous visit. Shoulder-L MR w contrast: No results found for this or any previous visit. Shoulder-R MR w/wo contrast: No results found for this or any previous visit. Shoulder-L MR w/wo contrast: No results found for this or any previous visit.  Shoulder-R MR wo contrast: No results found for this or any previous visit. Shoulder-L MR wo contrast: No results found for this or any previous visit. Shoulder-R CT w contrast: No results found for this or any previous visit. Shoulder-L CT w contrast: No results found for this or any previous visit. Shoulder-R CT w/wo contrast: No results found for this or any previous visit. Shoulder-L CT w/wo contrast: No results found for this or any previous visit. Shoulder-R CT wo contrast: No results found for this or any previous visit. Shoulder-L CT wo contrast: No results found for this or any previous visit. Shoulder-R DG Arthrogram: No results found for this or any previous visit. Shoulder-L DG Arthrogram: No results found for this or any previous visit. Shoulder-R DG 1 view: No results found for this or any previous visit. Shoulder-L DG 1 view: No results found for this or any previous visit. Shoulder-R DG: No results found for this or any previous visit. Shoulder-L DG: No results found for this or any previous visit.  Thoracic Imaging: Thoracic MR wo contrast: No results found for this or any previous visit. Thoracic MR wo contrast: No results found for this or any previous visit. Thoracic MR w/wo contrast: No results found for this or any previous visit. Thoracic MR w contrast: No results found for this or any previous visit. Thoracic CT wo contrast: No results found for this or any previous visit. Thoracic CT w/wo contrast: No results found for  this or any previous visit. Thoracic CT w/wo contrast: No results found for this or any previous visit. Thoracic CT w contrast: No results found for this or any previous visit. Thoracic DG 2-3 views: No results found for this or any previous visit. Thoracic DG 4 views: No results found for this or any previous visit. Thoracic DG: No results found for this or any previous visit. Thoracic DG w/swimmers view: No results found for this or any previous visit. Thoracic DG Myelogram views: No results found for this or any previous visit. Thoracic DG Myelogram views: No results found for this or any previous visit.  Lumbosacral Imaging: Lumbar MR wo contrast: No results found for this or any previous visit. Lumbar MR wo contrast: No results found for this or any previous visit. Lumbar MR w/wo contrast: No results found for this or any previous visit. Lumbar MR w contrast: No results found for this or any previous visit. Lumbar CT wo contrast: No results found for this or any previous visit. Lumbar CT w/wo contrast: No results found for this or any previous visit. Lumbar CT w/wo contrast: No results found for this or any previous visit. Lumbar CT w contrast: No results found for this or any previous visit. Lumbar DG 1V: No results found for this or any previous visit. Lumbar DG 1V (Clearing): No results found for this or any previous visit. Lumbar DG 2-3V (Clearing): No results found for this or any previous visit. Lumbar DG 2-3 views: No results found for this or any previous visit. Lumbar DG (Complete) 4+V: No results found for this or any previous visit. Lumbar DG F/E views: No results found for this or any previous visit. Lumbar DG Bending views: No results found for this or any previous visit. Lumbar DG Myelogram views: No results found for this or any previous visit. Lumbar DG Myelogram: No results found for this or any previous visit. Lumbar DG Myelogram: No results found for this or any  previous visit. Lumbar DG Myelogram: No results  found for this or any previous visit. Lumbar DG Myelogram Lumbosacral: No results found for this or any previous visit. Lumbar DG Diskogram views: No results found for this or any previous visit. Lumbar DG Diskogram views: No results found for this or any previous visit. Lumbar DG Epidurogram OP: No results found for this or any previous visit. Lumbar DG Epidurogram IP: No results found for this or any previous visit.  Sacroiliac Joint Imaging: Sacroiliac Joint DG: No results found for this or any previous visit. Sacroiliac Joint MR w/wo contrast: No results found for this or any previous visit. Sacroiliac Joint MR wo contrast: No results found for this or any previous visit.  Spine Imaging: Whole Spine DG Myelogram views: No results found for this or any previous visit. Whole Spine MR Mets screen: No results found for this or any previous visit. Whole Spine MR Mets screen: No results found for this or any previous visit. Whole Spine MR w/wo: No results found for this or any previous visit. MRA Spinal Canal w/ cm: No results found for this or any previous visit. MRA Spinal Canal wo/ cm: No results found for this or any previous visit. MRA Spinal Canal w/wo cm: No results found for this or any previous visit. Spine Outside MR Films: No results found for this or any previous visit. Spine Outside CT Films: No results found for this or any previous visit. CT-Guided Biopsy: No results found for this or any previous visit. CT-Guided Needle Placement: No results found for this or any previous visit. DG Spine outside: No results found for this or any previous visit. IR Spine outside: No results found for this or any previous visit. NM Spine outside: No results found for this or any previous visit. Epidurography 1: No results found for this or any previous visit. Epidurography 2: No results found for this or any previous visit.  Hip  Imaging: Hip-R MR w contrast: No results found for this or any previous visit. Hip-L MR w contrast: No results found for this or any previous visit. Hip-R MR w/wo contrast: No results found for this or any previous visit. Hip-L MR w/wo contrast: No results found for this or any previous visit. Hip-R MR wo contrast: No results found for this or any previous visit. Hip-L MR wo contrast: No results found for this or any previous visit. Hip-R CT w contrast: No results found for this or any previous visit. Hip-L CT w contrast: No results found for this or any previous visit. Hip-R CT w/wo contrast: No results found for this or any previous visit. Hip-L CT w/wo contrast: No results found for this or any previous visit. Hip-R CT wo contrast: No results found for this or any previous visit. Hip-L CT wo contrast: No results found for this or any previous visit. Hip-R DG 2-3 views: No results found for this or any previous visit. Hip-L DG 2-3 views: No results found for this or any previous visit. Hip-R DG Arthrogram: No results found for this or any previous visit. Hip-L DG Arthrogram: No results found for this or any previous visit. Hip-B DG Bilateral: No results found for this or any previous visit.  Knee Imaging: Knee-R MR w contrast: No results found for this or any previous visit. Knee-L MR w contrast: No results found for this or any previous visit. Knee-R MR w/wo contrast: No results found for this or any previous visit. Knee-L MR w/wo contrast: No results found for this or any previous visit.  Knee-R MR wo contrast: No results found for this or any previous visit. Knee-L MR wo contrast: No results found for this or any previous visit. Knee-R CT w contrast: No results found for this or any previous visit. Knee-L CT w contrast: No results found for this or any previous visit. Knee-R CT w/wo contrast: No results found for this or any previous visit. Knee-L CT w/wo contrast: No results found  for this or any previous visit. Knee-R CT wo contrast: No results found for this or any previous visit. Knee-L CT wo contrast: No results found for this or any previous visit. Knee-R DG 1-2 views: No results found for this or any previous visit. Knee-L DG 1-2 views: No results found for this or any previous visit. Knee-R DG 3 views: No results found for this or any previous visit. Knee-L DG 3 views: No results found for this or any previous visit. Knee-R DG 4 views: No results found for this or any previous visit. Knee-L DG 4 views: No results found for this or any previous visit. Knee-R DG Arthrogram: No results found for this or any previous visit. Knee-L DG Arthrogram: No results found for this or any previous visit.  Ankle Imaging: Ankle-R DG Complete: No results found for this or any previous visit. Ankle-L DG Complete: No results found for this or any previous visit.  Foot Imaging: Foot-R DG Complete: No results found for this or any previous visit. Foot-L DG Complete: No results found for this or any previous visit.  Complexity Note: Imaging results reviewed. Results shared with Teresa [name], using Layman's terms.                         Meds   Current Outpatient Medications:  .  alprazolam (XANAX) 2 MG tablet, Take 2 mg by mouth 2 (two) times daily as needed. , Disp: , Rfl:  .  cyclobenzaprine (FLEXERIL) 5 MG tablet, Take 1 tablet (5 mg total) by mouth 2 (two) times daily as needed for muscle spasms., Disp: 60 tablet, Rfl: 5 .  DULoxetine (CYMBALTA) 30 MG capsule, Take 1 capsule (30 mg total) by mouth daily., Disp: 30 capsule, Rfl: 0 .  ergocalciferol (VITAMIN D2) 50000 units capsule, Take 1 capsule (50,000 Units total) by mouth 2 (two) times a week. X 6 weeks., Disp: 12 capsule, Rfl: 0 .  Magnesium Oxide 500 MG CAPS, Take 1 capsule (500 mg total) by mouth 2 (two) times daily at 8 am and 10 pm., Disp: 180 capsule, Rfl: 0 .  meloxicam (MOBIC) 15 MG tablet, Take 1 tablet (15 mg  total) by mouth daily., Disp: 30 tablet, Rfl: 5 .  pregabalin (LYRICA) 50 MG capsule, Take 1 capsule (50 mg total) by mouth 3 (three) times daily., Disp: 90 capsule, Rfl: 0 .  PREVIDENT 5000 DRY MOUTH 1.1 % GEL dental gel, Take 1 application by mouth as needed., Disp: , Rfl: 9  ROS  Constitutional: Denies any fever or chills Gastrointestinal: No reported hemesis, hematochezia, vomiting, or acute GI distress Musculoskeletal: Denies any acute onset joint swelling, redness, loss of ROM, or weakness Neurological: No reported episodes of acute onset apraxia, aphasia, dysarthria, agnosia, amnesia, paralysis, loss of coordination, or loss of consciousness  Allergies  Teresa [name] is allergic to eggs or egg-derived products.  Teresa [name]  Drug: Teresa [name]  reports that she does not use drugs. Alcohol:  reports that she does not drink alcohol. Tobacco:  reports that she has been  smoking.  She has never used smokeless tobacco. Medical:  has a past medical history of  History of anterior cervical discectomy and fusion (ACDF) (left side). (10/18/2015), Bilateral hip pain (10/18/2015), Cervical spine pain (WC) (10/18/2015), Cervical spondylosis with myelopathy, Chronic cervical radiculopathy (WC) (10/18/2015), Chronic constipation, Chronic pain syndrome, Depression, Fibromyalgia, GERD (gastroesophageal reflux disease), Insomnia, Migraine, Neck pain, and Panic attack. Surgical: Teresa [name]  has a past surgical history that includes Neck surgery; btl; and Tubal ligation (Bilateral). Family: family history is not on file.  Constitutional Exam  General appearance: Well nourished, well developed, and well hydrated. In no apparent acute distress There were no vitals filed for this visit. BMI Assessment: Estimated body mass index is 23.54 kg/m as calculated from the following:   Height as of 03/10/18: 5' 7"  (1.702 m).   Weight as of 03/25/18: 150 lb 4.8 oz (68.2 kg).  BMI interpretation table: BMI level Category Range  association with higher incidence of chronic pain  <18 kg/m2 Underweight   18.5-24.9 kg/m2 Ideal body weight   25-29.9 kg/m2 Overweight Increased incidence by 20%  30-34.9 kg/m2 Obese (Class I) Increased incidence by 68%  35-39.9 kg/m2 Severe obesity (Class II) Increased incidence by 136%  >40 kg/m2 Extreme obesity (Class III) Increased incidence by 254%   BMI Readings from Last 4 Encounters:  03/25/18 23.54 kg/m  03/10/18 22.71 kg/m  01/16/18 22.71 kg/m  10/30/17 22.71 kg/m   Wt Readings from Last 4 Encounters:  03/25/18 150 lb 4.8 oz (68.2 kg)  03/10/18 145 lb (65.8 kg)  01/16/18 145 lb (65.8 kg)  10/30/17 145 lb (65.8 kg)  Psych/Mental status: Alert, oriented x 3 (person, place, & time)       Eyes: PERLA Respiratory: No evidence of acute respiratory distress  Cervical Spine Area Exam  Skin & Axial Inspection: No masses, redness, edema, swelling, or associated skin lesions Alignment: Symmetrical Functional ROM: Unrestricted ROM      Stability: No instability detected Muscle Tone/Strength: Functionally intact. No obvious neuro-muscular anomalies detected. Sensory (Neurological): Unimpaired Palpation: No palpable anomalies              Upper Extremity (UE) Exam    Side: Right upper extremity  Side: Left upper extremity  Skin & Extremity Inspection: Skin color, temperature, and hair growth are WNL. No peripheral edema or cyanosis. No masses, redness, swelling, asymmetry, or associated skin lesions. No contractures.  Skin & Extremity Inspection: Skin color, temperature, and hair growth are WNL. No peripheral edema or cyanosis. No masses, redness, swelling, asymmetry, or associated skin lesions. No contractures.  Functional ROM: Unrestricted ROM          Functional ROM: Unrestricted ROM          Muscle Tone/Strength: Functionally intact. No obvious neuro-muscular anomalies detected.  Muscle Tone/Strength: Functionally intact. No obvious neuro-muscular anomalies detected.   Sensory (Neurological): Unimpaired          Sensory (Neurological): Unimpaired          Palpation: No palpable anomalies              Palpation: No palpable anomalies              Specialized Test(s): Deferred         Specialized Test(s): Deferred          Thoracic Spine Area Exam  Skin & Axial Inspection: No masses, redness, or swelling Alignment: Symmetrical Functional ROM: Unrestricted ROM Stability: No instability detected Muscle Tone/Strength: Functionally intact. No obvious neuro-muscular  anomalies detected. Sensory (Neurological): Unimpaired Muscle strength & Tone: No palpable anomalies  Lumbar Spine Area Exam  Skin & Axial Inspection: No masses, redness, or swelling Alignment: Symmetrical Functional ROM: Unrestricted ROM       Stability: No instability detected Muscle Tone/Strength: Functionally intact. No obvious neuro-muscular anomalies detected. Sensory (Neurological): Unimpaired Palpation: No palpable anomalies       Provocative Tests: Lumbar Hyperextension and rotation test: evaluation deferred today       Lumbar Lateral bending test: evaluation deferred today       Patrick's Maneuver: evaluation deferred today                    Gait & Posture Assessment  Ambulation: Unassisted Gait: Relatively normal for age and body habitus Posture: WNL   Lower Extremity Exam    Side: Right lower extremity  Side: Left lower extremity  Skin & Extremity Inspection: Skin color, temperature, and hair growth are WNL. No peripheral edema or cyanosis. No masses, redness, swelling, asymmetry, or associated skin lesions. No contractures.  Skin & Extremity Inspection: Skin color, temperature, and hair growth are WNL. No peripheral edema or cyanosis. No masses, redness, swelling, asymmetry, or associated skin lesions. No contractures.  Functional ROM: Unrestricted ROM          Functional ROM: Unrestricted ROM          Muscle Tone/Strength: Functionally intact. No obvious neuro-muscular  anomalies detected.  Muscle Tone/Strength: Functionally intact. No obvious neuro-muscular anomalies detected.  Sensory (Neurological): Unimpaired  Sensory (Neurological): Unimpaired  Palpation: No palpable anomalies  Palpation: No palpable anomalies   Assessment  Primary Diagnosis & Pertinent Problem List: The primary encounter diagnosis was Cervical radicular pain (WC) (Primary Area of Pain) (Left). Diagnoses of Chronic neck pain (WC) (Secondary Area of Pain) (Bilateral) (L>R) and Chronic shoulder pain (Secondary Area of Pain) (Bilateral) (L>R) were also pertinent to this visit.  Status Diagnosis  Controlled Controlled Controlled 1. Cervical radicular pain (WC) (Primary Area of Pain) (Left)   2. Chronic neck pain (WC) (Secondary Area of Pain) (Bilateral) (L>R)   3. Chronic shoulder pain (Secondary Area of Pain) (Bilateral) (L>R)     Problems updated and reviewed during this visit: No problems updated. Plan of Care  Pharmacotherapy (Medications Ordered): No orders of the defined types were placed in this encounter.  Medications administered today: Teresa [name]. Teresa [name] had no medications administered during this visit.  Procedure Orders    No procedure(s) ordered today   Lab Orders  No laboratory test(s) ordered today   Imaging Orders  No imaging studies ordered today   Referral Orders  No referral(s) requested today    Interventional management options: Planned, scheduled, and/or pending:   ***   Considering:   Therapeutic right-sided cervical facet RFA Diagnostic bilateralCervical FacetBlock #3 Possible bilateral cervical facet RFA(Starting with the left side) Palliative Left CESI    Palliative PRN treatment(s):   Palliative left cervical epidural steroid injection   Provider-requested follow-up: No follow-ups on file.  Future Appointments  Date Time Provider [place]  04/16/2018  9:30 AM Milinda Pointer, MD [facility] None  09/25/2018  6:00 PM [facility]  [place] [facility] None   Primary Care Physician: Suzanna Obey, MD Location: [facility] Note by: Gaspar Cola, MD Date: 04/16/2018; Time: 7:33 AM

## 2018-04-17 ENCOUNTER — Other Ambulatory Visit: Payer: Self-pay | Admitting: Family Medicine

## 2018-04-17 DIAGNOSIS — Z0000 Encounter for general adult medical examination without abnormal findings: Secondary | ICD-10-CM

## 2018-04-22 ENCOUNTER — Ambulatory Visit: Payer: Self-pay | Admitting: Adult Health

## 2018-04-22 VITALS — BP 107/64 | HR 85 | Temp 98.1°F | Wt 145.6 lb

## 2018-04-22 DIAGNOSIS — F17200 Nicotine dependence, unspecified, uncomplicated: Secondary | ICD-10-CM

## 2018-04-22 DIAGNOSIS — J209 Acute bronchitis, unspecified: Secondary | ICD-10-CM

## 2018-04-22 MED ORDER — BENZONATATE 100 MG PO CAPS: 100.0000 mg | ORAL_CAPSULE | Freq: Three times a day (TID) | ORAL | 0 refills | Status: AC

## 2018-04-22 MED ORDER — LEVOFLOXACIN 750 MG PO TABS: 750.0000 mg | ORAL_TABLET | Freq: Every day | ORAL | 0 refills | Status: DC

## 2018-04-22 NOTE — Progress Notes (Signed)
  Patient: Teresa PulseSandra O Smolenski Female    DOB: 04/04/1961   57 y.o.   MRN: [id number] Visit Date: 04/22/2018  Today's Provider: [facility]   Chief Complaint  Patient presents with  . Cough    spitting up green sputum   Subjective:    HPI Patient presents with worsening URI symptoms for more than a month. She was seen in March with similar symptoms and was advised to push fluids and take OTC remedies. She reports severe head congestion, throat pain and a productive cough with sputum that is pasty green. Symptoms associated with fatigue. Denies wheezing, and  fever. Denies sick contacts.  She continues to smoke  Allergies  Allergen Reactions  . Eggs Or Egg-Derived Products Nausea And Vomiting   Previous Medications   ALPRAZOLAM (XANAX) 2 MG TABLET    Take 2 mg by mouth 2 (two) times daily as needed.    CYCLOBENZAPRINE (FLEXERIL) 5 MG TABLET    Take 1 tablet (5 mg total) by mouth 2 (two) times daily as needed for muscle spasms.   DULOXETINE (CYMBALTA) 30 MG CAPSULE    Take 1 capsule (30 mg total) by mouth daily.   MAGNESIUM OXIDE 500 MG CAPS    Take 1 capsule (500 mg total) by mouth 2 (two) times daily at 8 am and 10 pm.   MELOXICAM (MOBIC) 15 MG TABLET    Take 1 tablet (15 mg total) by mouth daily.   PREGABALIN (LYRICA) 50 MG CAPSULE    Take 1 capsule (50 mg total) by mouth 3 (three) times daily.   PREVIDENT 5000 DRY MOUTH 1.1 % GEL DENTAL GEL    Take 1 application by mouth as needed.    Review of Systems  Constitutional: Positive for fatigue. Negative for fever.  HENT: Positive for congestion and sore throat. Negative for postnasal drip, rhinorrhea and sinus pain.   Respiratory: Positive for chest tightness, shortness of breath and wheezing.   Musculoskeletal: Negative.   Skin: Negative.   Allergic/Immunologic: Negative.     Social History   Tobacco Use  . Smoking status: Current Some Day Smoker  . Smokeless tobacco: Never Used  . Tobacco comment: Pt states she quit but  started back a couple of months ago  Substance Use Topics  . Alcohol use: No    Alcohol/week: 0.0 oz   Objective:   BP 107/64   [name] 85   Temp 98.1 F (36.7 C)   Wt 145 lb 9.6 oz (66 kg)   LMP 09/05/2014 (Within Years)   BMI 22.80 kg/m   Physical Exam  Constitutional: She appears well-developed and well-nourished. She appears distressed.  Eyes: Pupils are equal, round, and reactive to light. EOM are normal.  Neck: Normal range of motion. Neck supple.  Cardiovascular: Normal rate, regular rhythm, normal heart sounds and intact distal pulses.  Pulmonary/Chest: She is in respiratory distress. She has wheezes.  Diminished breath sounds in BLLL  Abdominal: Soft. Bowel sounds are normal.      Assessment & Plan:  1. Acute bronchitis, unspecified organism Given that symptoms have persisted for >1 month and patient is getting sicker and continues to smoke, will treat with antibiotics and expectorants Tessalon 100mg  tid x 7 days and levquin 750mg  daily x 7 days Encouraged to push fluids  2. Tobacco use disorder Smoking cessation advice given   [facility]   [facility]

## 2018-04-29 ENCOUNTER — Ambulatory Visit: Payer: Self-pay | Admitting: Family Medicine

## 2018-04-29 VITALS — BP 124/73 | HR 89 | Temp 98.8°F | Wt 146.0 lb

## 2018-04-29 DIAGNOSIS — Z9289 Personal history of other medical treatment: Secondary | ICD-10-CM

## 2018-04-29 DIAGNOSIS — M797 Fibromyalgia: Secondary | ICD-10-CM

## 2018-04-29 DIAGNOSIS — Z09 Encounter for follow-up examination after completed treatment for conditions other than malignant neoplasm: Secondary | ICD-10-CM

## 2018-04-29 DIAGNOSIS — M47812 Spondylosis without myelopathy or radiculopathy, cervical region: Secondary | ICD-10-CM

## 2018-04-29 DIAGNOSIS — G8929 Other chronic pain: Secondary | ICD-10-CM

## 2018-04-29 DIAGNOSIS — M62838 Other muscle spasm: Secondary | ICD-10-CM

## 2018-04-29 DIAGNOSIS — R52 Pain, unspecified: Secondary | ICD-10-CM

## 2018-04-29 DIAGNOSIS — F329 Major depressive disorder, single episode, unspecified: Secondary | ICD-10-CM

## 2018-04-29 DIAGNOSIS — F32A Depression, unspecified: Secondary | ICD-10-CM

## 2018-04-29 DIAGNOSIS — M542 Cervicalgia: Secondary | ICD-10-CM

## 2018-04-29 DIAGNOSIS — Z0000 Encounter for general adult medical examination without abnormal findings: Secondary | ICD-10-CM

## 2018-04-29 NOTE — Progress Notes (Addendum)
Patient: Teresa [name] Female    DOB: 1961/08/12   57 y.o.   MRN: [id number] Visit Date: 05/01/2018  Today's Provider: Kallie Locks, FNP   Chief Complaint  Patient presents with  . Follow-up   Subjective:   HPI   Follow up for cough. She has completed antibiotics, tolerated well. Cough and other signs and symptoms have resolved. She denies chest pain, shortness of breath, and heart palpitations. She denies visual changes, dizziness, headaches, and falls.   States that she has mild hot flashes. She denies fevers, chills, fatigue, intentional weight loss, and night sweats. She has a good appetite. She denies abdominal pain, nausea, vomiting, diarrhea, and constipation. She denies any bleeding episodes.   She states that she has been taking Cymbalta, Lyrica, and Xanax as prescribed by Dr. Delano Metz, but she can no longer afford to see him.  She was also prescribed Flexeril and Meloxicam.   She continues to have mild pain in her neck, and radiates to shoulders, lower back, and leg, mostly on left side.   Allergies  Allergen Reactions  . Eggs Or Egg-Derived Products Nausea And Vomiting   Allergies as of 04/29/2018      Reactions   Eggs Or Egg-derived Products Nausea And Vomiting      Medication List        Accurate as of 04/29/18 11:59 PM. Always use your most recent med list.          alprazolam 2 MG tablet Commonly known as:  XANAX Take 2 mg by mouth 2 (two) times daily as needed.   cyclobenzaprine 5 MG tablet Commonly known as:  FLEXERIL Take 1 tablet (5 mg total) by mouth 2 (two) times daily as needed for muscle spasms.   DULoxetine 30 MG capsule Commonly known as:  CYMBALTA Take 1 capsule (30 mg total) by mouth daily.   levofloxacin 750 MG tablet Commonly known as:  LEVAQUIN Take 1 tablet (750 mg total) by mouth daily.   Magnesium Oxide 500 MG Caps Take 1 capsule (500 mg total) by mouth 2 (two) times daily at 8 am and 10 pm.   meloxicam 15 MG  tablet Commonly known as:  MOBIC Take 1 tablet (15 mg total) by mouth daily.   pregabalin 50 MG capsule Commonly known as:  LYRICA Take 1 capsule (50 mg total) by mouth 3 (three) times daily.   PREVIDENT 5000 DRY MOUTH 1.1 % Gel dental gel Generic drug:  sodium fluoride Take 1 application by mouth as needed.       Review of Systems  Constitutional: Negative.   HENT: Negative.   Eyes: Negative.   Respiratory: Negative.   Cardiovascular: Negative.   Gastrointestinal: Negative.   Endocrine: Negative.   Genitourinary: Negative.   Musculoskeletal: Positive for neck pain.  Skin: Negative.   Allergic/Immunologic: Negative.   Neurological: Negative.   Hematological: Negative.   Psychiatric/Behavioral: Negative.     Social History   Tobacco Use  . Smoking status: Former Games developer  . Smokeless tobacco: Never Used  Substance Use Topics  . Alcohol use: No    Alcohol/week: 0.0 oz   Objective:   BP 124/73   Pulse 89   Temp 98.8 F (37.1 C)   Wt 146 lb (66.2 kg)   LMP 09/05/2014 (Within Years)   BMI 22.87 kg/m   Physical Exam  Constitutional: She is oriented to person, place, and time. She appears well-developed and well-nourished.  HENT:  Head: Normocephalic and atraumatic.  Right  Ear: External ear normal.  Left Ear: External ear normal.  Nose: Nose normal.  Mouth/Throat: Oropharynx is clear and moist.  Eyes: Pupils are equal, round, and reactive to light. Conjunctivae and EOM are normal.  Neck: Neck supple.  Limited ROM. Stiffness  Cardiovascular: Normal rate, regular rhythm, normal heart sounds and intact distal pulses.  Pulmonary/Chest: Effort normal and breath sounds normal.  Abdominal: Soft. Bowel sounds are normal.  Musculoskeletal: Normal range of motion.  Neurological: She is alert and oriented to person, place, and time.  Skin: Skin is warm and dry.  Psychiatric: She has a normal mood and affect. Her behavior is normal. Judgment and thought content normal.   Nursing note and vitals reviewed.      Assessment & Plan:  1. Pain She has chronic neck pain. She is currently not taking medication for pain, but previously took Flexeril and  Cymbalta for neck pain. Was previously followed by Dr. Threasa Beards in Pain Management. We will refill Flexeril and Mobic today.   2. Cervical facet syndrome (WC) (Bilateral) (L>R) Stable with mild pain. Not worsening. She is s/p: Therapeutic Medial Branch Facet Radiofrequency Ablation on 01/16/2018.  3. Healthcare maintenance - Lipid Profile - HgB A1c - CBC; Future - TSH - Comprehensive metabolic panel; Future - Comprehensive metabolic panel - CBC  4. History of mammogram Referral through [facility], for repeat Diagnostic Mammogram.   5. Depression She will be scheduled to follow up with Herbert Seta at our office for referral to [facility] for assessment and possible appointment with psychiatrist at [facility] for refills on Cymbalta, Xanax, and Lyrica.  6. Follow up Follow up in 1 week to review Labs.  Will assess for follow up for Diagnostic Mammogram at next office visit.   Will schedule appointment to see [facility] for referral for antidepressant medication use.     Meds ordered this encounter  Medications  . cyclobenzaprine (FLEXERIL) 5 MG tablet    Sig: Take 1 tablet (5 mg total) by mouth 2 (two) times daily as needed for muscle spasms.    Dispense:  60 tablet    Refill:  3    Do not place this medication, or any other prescription from our practice, on "Automatic Refill". Patient may have prescription filled one day early if pharmacy is closed on scheduled refill date.  . meloxicam (MOBIC) 15 MG tablet    Sig: Take 1 tablet (15 mg total) by mouth daily.    Dispense:  30 tablet    Refill:  3    Do not place medication on "Automatic Refill". Fill one day early if pharmacy is closed on scheduled refill date.     Kallie Locks, FNP   [facility]

## 2018-04-30 LAB — COMPREHENSIVE METABOLIC PANEL
ALT: 18 IU/L (ref 0–32)
AST: 21 IU/L (ref 0–40)
Albumin/Globulin Ratio: 2.3 — ABNORMAL HIGH (ref 1.2–2.2)
Albumin: 4.6 g/dL (ref 3.5–5.5)
Alkaline Phosphatase: 62 IU/L (ref 39–117)
BUN/Creatinine Ratio: 10 (ref 9–23)
BUN: 10 mg/dL (ref 6–24)
Bilirubin Total: 0.5 mg/dL (ref 0.0–1.2)
CO2: 22 mmol/L (ref 20–29)
Calcium: 9.7 mg/dL (ref 8.7–10.2)
Chloride: 105 mmol/L (ref 96–106)
Creatinine, Ser: 1.01 mg/dL — ABNORMAL HIGH (ref 0.57–1.00)
GFR calc Af Amer: 72 mL/min/{1.73_m2} (ref >59)
GFR calc non Af Amer: 62 mL/min/{1.73_m2} (ref >59)
Globulin, Total: 2 g/dL (ref 1.5–4.5)
Glucose: 88 mg/dL (ref 65–99)
Potassium: 4.5 mmol/L (ref 3.5–5.2)
Sodium: 143 mmol/L (ref 134–144)
Total Protein: 6.6 g/dL (ref 6.0–8.5)

## 2018-04-30 LAB — HEMOGLOBIN A1C
Est. average glucose Bld gHb Est-mCnc: 100 mg/dL
Hgb A1c MFr Bld: 5.1 % (ref 4.8–5.6)

## 2018-04-30 LAB — TSH: TSH: 2.38 u[IU]/mL (ref 0.450–4.500)

## 2018-04-30 LAB — LIPID PANEL
Chol/HDL Ratio: 2.6 ratio (ref 0.0–4.4)
Cholesterol, Total: 160 mg/dL (ref 100–199)
HDL: 61 mg/dL (ref >39)
LDL Calculated: 78 mg/dL (ref 0–99)
Triglycerides: 103 mg/dL (ref 0–149)
VLDL Cholesterol Cal: 21 mg/dL (ref 5–40)

## 2018-04-30 LAB — CBC
Hematocrit: 37.6 % (ref 34.0–46.6)
Hemoglobin: 13.2 g/dL (ref 11.1–15.9)
MCH: 31.4 pg (ref 26.6–33.0)
MCHC: 35.1 g/dL (ref 31.5–35.7)
MCV: 89 fL (ref 79–97)
Platelets: 271 10*3/uL (ref 150–379)
RBC: 4.21 x10E6/uL (ref 3.77–5.28)
RDW: 13.7 % (ref 12.3–15.4)
WBC: 8.8 10*3/uL (ref 3.4–10.8)

## 2018-05-01 MED ORDER — MELOXICAM 15 MG PO TABS
15.0000 mg | ORAL_TABLET | Freq: Every day | ORAL | 3 refills | Status: DC
Start: 2018-05-01 — End: 2018-09-25

## 2018-05-01 MED ORDER — CYCLOBENZAPRINE HCL 5 MG PO TABS: 5.0000 mg | ORAL_TABLET | Freq: Two times a day (BID) | ORAL | 3 refills | Status: DC | PRN

## 2018-05-01 NOTE — Addendum Note (Signed)
Addended by: Kallie Locks on: 05/01/2018 06:24 PM   Modules accepted: Orders

## 2018-05-06 ENCOUNTER — Other Ambulatory Visit: Payer: Self-pay

## 2018-05-13 ENCOUNTER — Ambulatory Visit: Payer: Self-pay

## 2018-05-13 ENCOUNTER — Ambulatory Visit: Payer: Self-pay | Admitting: Licensed Clinical Social Worker

## 2018-05-14 ENCOUNTER — Telehealth: Payer: Self-pay

## 2018-05-14 NOTE — Telephone Encounter (Signed)
Called pt to r/s appts. No answer, vm is full

## 2018-08-27 ENCOUNTER — Telehealth: Payer: Self-pay

## 2018-08-27 NOTE — Telephone Encounter (Signed)
Called pt to check on referral in system. Pt [facility]. Number provided. Pt also asked about lab results. Results given to pt. PT verbalized understanding.

## 2018-09-02 ENCOUNTER — Telehealth: Payer: Self-pay | Admitting: Pain Medicine

## 2018-09-02 NOTE — Telephone Encounter (Signed)
Attempted to return patient call.  LEft message to call us back.

## 2018-09-02 NOTE — Telephone Encounter (Signed)
Wants to speak with Nurse. Please call

## 2018-09-03 ENCOUNTER — Ambulatory Visit: Payer: Self-pay | Admitting: Ophthalmology

## 2018-09-03 NOTE — Telephone Encounter (Signed)
Attempted to call patient, message left. 

## 2018-09-25 ENCOUNTER — Ambulatory Visit: Payer: Self-pay | Admitting: Adult Health Nurse Practitioner

## 2018-09-25 ENCOUNTER — Encounter: Payer: Self-pay | Admitting: Adult Health Nurse Practitioner

## 2018-09-25 DIAGNOSIS — R030 Elevated blood-pressure reading, without diagnosis of hypertension: Secondary | ICD-10-CM

## 2018-09-25 DIAGNOSIS — M542 Cervicalgia: Secondary | ICD-10-CM

## 2018-09-25 DIAGNOSIS — M62838 Other muscle spasm: Secondary | ICD-10-CM

## 2018-09-25 DIAGNOSIS — G8929 Other chronic pain: Secondary | ICD-10-CM

## 2018-09-25 DIAGNOSIS — M797 Fibromyalgia: Secondary | ICD-10-CM

## 2018-09-25 DIAGNOSIS — M47812 Spondylosis without myelopathy or radiculopathy, cervical region: Secondary | ICD-10-CM

## 2018-09-25 MED ORDER — FLUOXETINE HCL 20 MG PO CAPS: 20.0000 mg | ORAL_CAPSULE | Freq: Every day | ORAL | 3 refills | Status: DC

## 2018-09-25 MED ORDER — MELOXICAM 15 MG PO TABS: 15.0000 mg | ORAL_TABLET | Freq: Every day | ORAL | 1 refills | Status: DC

## 2018-09-25 MED ORDER — CYCLOBENZAPRINE HCL 5 MG PO TABS: 5.0000 mg | ORAL_TABLET | Freq: Every evening | ORAL | 0 refills | Status: DC | PRN

## 2018-09-25 NOTE — Progress Notes (Signed)
  Patient: Teresa [name] Female    DOB: [birth year]   57 y.o.   MRN: [id number] Visit Date: 09/25/2018  Today's Provider: Jacelyn Pi, NP   Chief Complaint  Patient presents with  . Follow-up   Subjective:    HPI   Taking all medications as directed.   Not seeing pain management MD any more. Not on Lyrica or Cymbalta. Was taking Mobic and Flexeril to manage pain.  Started on Prozac 20mg  daily by another MD to help with chronic neck pain and anxiety- not currently seeing Psych or therapy.   Pt states that she was out for worker's comp x 8-9 years and she has had surgery on her neck and has limited ability and due to this is having a difficult time finding work.   Currently with no income- states she is "bouncing" from place to place.    Allergies  Allergen Reactions  . Eggs Or Egg-Derived Products Nausea And Vomiting   Previous Medications   ALPRAZOLAM (XANAX) 2 MG TABLET    Take 2 mg by mouth 2 (two) times daily as needed.    CYCLOBENZAPRINE (FLEXERIL) 5 MG TABLET    Take 1 tablet (5 mg total) by mouth 2 (two) times daily as needed for muscle spasms.   DULOXETINE (CYMBALTA) 30 MG CAPSULE    Take 1 capsule (30 mg total) by mouth daily.   LEVOFLOXACIN (LEVAQUIN) 750 MG TABLET    Take 1 tablet (750 mg total) by mouth daily.   MAGNESIUM OXIDE 500 MG CAPS    Take 1 capsule (500 mg total) by mouth 2 (two) times daily at 8 am and 10 pm.   MELOXICAM (MOBIC) 15 MG TABLET    Take 1 tablet (15 mg total) by mouth daily.   PREGABALIN (LYRICA) 50 MG CAPSULE    Take 1 capsule (50 mg total) by mouth 3 (three) times daily.   PREVIDENT 5000 DRY MOUTH 1.1 % GEL DENTAL GEL    Take 1 application by mouth as needed.    Review of Systems  All other systems reviewed and are negative.   Social History   Tobacco Use  . Smoking status: Former Games developer  . Smokeless tobacco: Never Used  Substance Use Topics  . Alcohol use: No    Alcohol/week: 0.0 standard drinks   Objective:   BP (!) 145/87  (BP Location: Left Arm, Patient Position: Sitting)   Temp 98.5 F (36.9 C)   Ht 5\' 7"  (1.702 m)   Wt 139 lb 3.2 oz (63.1 kg)   LMP 09/05/2014 (Within Years)   BMI 21.80 kg/m   Physical Exam  Constitutional: She is oriented to person, place, and time. She appears well-developed and well-nourished.  HENT:  Head: Normocephalic and atraumatic.  Cardiovascular: Normal rate, regular rhythm and normal heart sounds.  Pulmonary/Chest: Effort normal and breath sounds normal.  Abdominal: Soft. Bowel sounds are normal.  Neurological: She is alert and oriented to person, place, and time.  Skin: Skin is warm and dry.        Assessment & Plan:     Will check Mg level today before restarting supplementation.      Flexeril QHS PRN and Meloxicam daily for neck pain.   BP is elevated today- will monitor FU in 4 weeks for repeat.   FU with Heather.  Refill Prozac.    Jacelyn Pi, NP   [facility]

## 2018-09-26 LAB — COMPREHENSIVE METABOLIC PANEL
A/G RATIO: 2.4 — AB (ref 1.2–2.2)
ALBUMIN: 5.1 g/dL (ref 3.5–5.5)
ALT: 16 IU/L (ref 0–32)
AST: 16 IU/L (ref 0–40)
Alkaline Phosphatase: 74 IU/L (ref 39–117)
BUN / CREAT RATIO: 15 (ref 9–23)
BUN: 16 mg/dL (ref 6–24)
Bilirubin Total: 0.4 mg/dL (ref 0.0–1.2)
CALCIUM: 10.4 mg/dL — AB (ref 8.7–10.2)
CHLORIDE: 103 mmol/L (ref 96–106)
CO2: 18 mmol/L — AB (ref 20–29)
Creatinine, Ser: 1.08 mg/dL — ABNORMAL HIGH (ref 0.57–1.00)
GFR, EST AFRICAN AMERICAN: 66 mL/min/{1.73_m2} (ref >59)
GFR, EST NON AFRICAN AMERICAN: 57 mL/min/{1.73_m2} — AB (ref >59)
Globulin, Total: 2.1 g/dL (ref 1.5–4.5)
Glucose: 83 mg/dL (ref 65–99)
Potassium: 5.4 mmol/L — ABNORMAL HIGH (ref 3.5–5.2)
Sodium: 147 mmol/L — ABNORMAL HIGH (ref 134–144)
TOTAL PROTEIN: 7.2 g/dL (ref 6.0–8.5)

## 2018-09-26 LAB — MAGNESIUM: Magnesium: 2.2 mg/dL (ref 1.6–2.3)

## 2018-09-30 ENCOUNTER — Institutional Professional Consult (permissible substitution): Payer: Self-pay | Admitting: Licensed Clinical Social Worker

## 2018-10-23 ENCOUNTER — Ambulatory Visit: Payer: Self-pay

## 2019-01-28 ENCOUNTER — Ambulatory Visit: Payer: Self-pay | Admitting: Gerontology

## 2019-01-28 ENCOUNTER — Other Ambulatory Visit: Payer: Self-pay

## 2019-01-28 ENCOUNTER — Encounter: Payer: Self-pay | Admitting: Gerontology

## 2019-01-28 VITALS — BP 120/75 | HR 75 | Ht 67.0 in | Wt 149.9 lb

## 2019-01-28 DIAGNOSIS — Z0000 Encounter for general adult medical examination without abnormal findings: Secondary | ICD-10-CM

## 2019-01-28 DIAGNOSIS — M25551 Pain in right hip: Secondary | ICD-10-CM

## 2019-01-28 NOTE — Progress Notes (Signed)
Patient: Teresa [name] Female    DOB: 10/07/1961   58 y.o.   MRN: [id number] Visit Date: 01/28/2019  Today's Provider: Langston Reusing, NP   Chief Complaint  Patient presents with  . Follow-up    off balance, increasing over the last 3 weeks, more on R side into hip and down into knee   Subjective:    HPI   Teresa [name] 58  Y/o female presents for c/o increasing sharp intermitent  right hip pain that radiates to the knee and she reports that it " feels like my knee will buckle".She reports that the pain stated 3 weeks ago and it occurs 2-3 times in a day. She reports that the pain lasts 30 minutes and resolves.She denies any injury and she reports that the pain starts when she's walking. She reports taking tylenol 650 mg with minimal relief. She reports that she can't afford Lyrica and doesn't go to pain management.She denies fall and works with cane. She denies chest pain, palpitation, light headedness, shortness of breath.  Allergies  Allergen Reactions  . Eggs Or Egg-Derived Products Nausea And Vomiting   Previous Medications   CYCLOBENZAPRINE (FLEXERIL) 5 MG TABLET    Take 1 tablet (5 mg total) by mouth at bedtime as needed for muscle spasms.   FLUOXETINE (PROZAC) 20 MG CAPSULE    Take 1 capsule (20 mg total) by mouth daily.   PREVIDENT 5000 DRY MOUTH 1.1 % GEL DENTAL GEL    Take 1 application by mouth as needed.    Review of Systems  Respiratory: Negative.   Cardiovascular: Negative.   Gastrointestinal: Negative.   Genitourinary: Negative.   Musculoskeletal: Positive for gait problem (ambulates with cane), myalgias (history of fibromyalgia) and neck pain (chronic neck pain). Back pain: chronic back pain.  Skin: Negative.   Psychiatric/Behavioral: Negative.     Social History   Tobacco Use  . Smoking status: Current Every Day Smoker  . Smokeless tobacco: Never Used  Substance Use Topics  . Alcohol use: No    Alcohol/week: 0.0 standard drinks   Objective:   BP 120/75  (BP Location: Left Arm, Patient Position: Sitting)   Pulse 75   Ht _0  (1.702 m)   Wt 149 lb 14.4 oz (68 kg)   LMP 09/05/2014 (Within Years)   SpO2 98%   BMI 23.48 kg/m   Physical Exam Constitutional:      Appearance: Normal appearance.  HENT:     Head: Normocephalic and atraumatic.  Neck:     Musculoskeletal: Normal range of motion.  Cardiovascular:     Rate and Rhythm: Normal rate and regular rhythm.     Pulses: Normal pulses.     Heart sounds: Normal heart sounds.  Pulmonary:     Effort: Pulmonary effort is normal.     Breath sounds: Normal breath sounds.  Abdominal:     General: Bowel sounds are normal.     Palpations: Abdomen is soft.  Musculoskeletal:        General: Tenderness (2/10 pain on palpation to right hip) present.  Skin:    General: Skin is warm and dry.  Neurological:     General: No focal deficit present.     Mental Status: She is alert and oriented to person, place, and time.  Psychiatric:        Mood and Affect: Mood normal.        Behavior: Behavior normal.        Thought Content: Thought content  normal.        Judgment: Judgment normal.         Assessment & Plan:     1. Acute hip pain, right - She was advised to continue on 650 mg tylenol every 6-8 hours as needed for pain. - She was advised to alternate heat and ice pack. - She was advised to walk with cane - Follow up with Dr Vickki Hearing  2. Health care maintenance - [name] serum creatinine was 1.08 mg/dl , NA 147 mmol/L on 09/25/18, cMet was collected during visit. She was advised to increase water intake. - Comp Met (CMET) - Follow up in 6 weeks.       Langston Reusing, NP   [facility]

## 2019-01-28 NOTE — Patient Instructions (Addendum)
-   Continue using ice and alternate heat pad - Take tylenol as needed - Dr Justice Rocher referral - Walk with cane

## 2019-01-29 ENCOUNTER — Other Ambulatory Visit: Payer: Self-pay

## 2019-01-29 ENCOUNTER — Ambulatory Visit: Payer: Self-pay | Admitting: Ophthalmology

## 2019-01-29 DIAGNOSIS — Z0000 Encounter for general adult medical examination without abnormal findings: Secondary | ICD-10-CM

## 2019-01-29 LAB — COMPREHENSIVE METABOLIC PANEL
ALBUMIN: 5 g/dL — AB (ref 3.8–4.9)
ALT: 28 IU/L (ref 0–32)
AST: 26 IU/L (ref 0–40)
Albumin/Globulin Ratio: 2.3 — ABNORMAL HIGH (ref 1.2–2.2)
Alkaline Phosphatase: 69 IU/L (ref 39–117)
BUN / CREAT RATIO: 13 (ref 9–23)
BUN: 11 mg/dL (ref 6–24)
Bilirubin Total: 0.4 mg/dL (ref 0.0–1.2)
CO2: 22 mmol/L (ref 20–29)
Calcium: 10 mg/dL (ref 8.7–10.2)
Chloride: 103 mmol/L (ref 96–106)
Creatinine, Ser: 0.87 mg/dL (ref 0.57–1.00)
GFR calc Af Amer: 86 mL/min/{1.73_m2} (ref >59)
GFR, EST NON AFRICAN AMERICAN: 74 mL/min/{1.73_m2} (ref >59)
GLUCOSE: 99 mg/dL (ref 65–99)
Globulin, Total: 2.2 g/dL (ref 1.5–4.5)
Potassium: 5.3 mmol/L — ABNORMAL HIGH (ref 3.5–5.2)
Sodium: 142 mmol/L (ref 134–144)
Total Protein: 7.2 g/dL (ref 6.0–8.5)

## 2019-01-31 LAB — FECAL OCCULT BLOOD, IMMUNOCHEMICAL: FECAL OCCULT BLD: NEGATIVE

## 2019-02-24 ENCOUNTER — Ambulatory Visit: Payer: Self-pay | Admitting: Specialist

## 2019-02-24 DIAGNOSIS — M25551 Pain in right hip: Secondary | ICD-10-CM

## 2019-02-24 NOTE — Progress Notes (Signed)
  Subjective:     Patient ID: Teresa [name], female   DOB: 17-Aug-1961, 58 y.o.   MRN: [id number]  HPI 58 year old had a work comp injury in 2011 resulting in a 3 level ACDF. In 2014 since the accident she'd had problems in her left side. Both UE and LE. She recalls the term myelopathy being used.  For the last 2 months she has developed right hip pain possibly from twisting while standing at the side. Now the pain radiates to the right knee and the right knee feels unstable at times. She also tells me she has fibromyalgia.   Review of Systems     Objective:   Physical Exam Her gait -she has a slight limp to the right side. She normaly holds a cane in her right hand. I did not ask her to heel/toe walk. DTR's: 1+right KJ 2+left AJ's 2+ bil. No clonus present. Toe signs downward. Proprioception intact. MMT 5/5. Seated SLR neg right pos left for LBP.  In the supine position FROM both hips. Rotation of the right hip at 90 deg caused left sided pain. SLR neg.  Forward flexion to 90 deg extension 20 deg with pain.in her right scapular region. Right knee:0 effusion. FROM. No ligamentus laxity.     Assessment:    Right hip pain possibly trochanteric brusitis.  Rec: As a baseline I'm going to order an AP of the pelvis and AP/LAT of the spine.     Plan:

## 2019-03-09 ENCOUNTER — Other Ambulatory Visit: Payer: Self-pay | Admitting: Physician Assistant

## 2019-03-09 DIAGNOSIS — Z124 Encounter for screening for malignant neoplasm of cervix: Secondary | ICD-10-CM

## 2019-03-09 NOTE — Progress Notes (Signed)
Patient: Teresa [name]           Date of Birth: [birth year]           MRN: [id number] Visit Date: 03/09/2019 PCP: Lesli Albee, MD  Cervical Cancer Screening Do you smoke?: Yes Have you ever had or been told you have an allergy to latex products?: No Marital status: Widowed Date of last pap smear: More than 5 yrs ago Number of pregnancies: 2 Number of births: 1 Have you ever had any of the following? Hysterectomy: No Tubal ligation (tubes tied): Yes Abnormal bleeding: No Abnormal pap smear: No Venereal warts: No A sex partner with venereal warts: No A high risk* sex partner: No  Cervical Exam        Patient's History Patient Active Problem List   Diagnosis Date Noted  . Elevated BP without diagnosis of hypertension 09/25/2018  . Cervicalgia 03/24/2018  . 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 DDD (degenerative disc disease), cervical 03/24/2018  . Spondylosis without myelopathy or radiculopathy, cervical region 03/10/2018  . Vitamin D insufficiency 03/10/2018  . Acute postoperative pain 01/16/2018  . Cervicogenic headache (WC) 10/30/2017  . GERD (gastroesophageal reflux disease) 09/20/2017  . Abnormal MRI, cervical spine (04/16/2017) 07/02/2017  . Weakness of left leg 06/27/2017  . Depression 04/04/2017  . Disturbance of skin sensation (Left side of face) 02/13/2017  . Muscle spasms of lower extremity 09/19/2016  . Chronic hip pain (Bilateral) 02/01/2016  . Chronic shoulder pain (Secondary Area of Pain) (Bilateral) (L>R) 02/01/2016  . Abnormal UDS (12/01/2015) 12/12/2015  . Work related injury (DOI: 07/20/2010) 12/05/2015  . Neurogenic pain 12/05/2015  . Neuropathic pain 12/05/2015  . Healthcare maintenance 12/01/2015  . 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 Chronic neck pain (WC) (Secondary Area of Pain) (Bilateral) (L>R) 12/01/2015  . Musculoskeletal pain 12/01/2015  . Muscle spasms of neck 12/01/2015   . History of panic attacks 10/18/2015  . Encounter for therapeutic drug level monitoring 10/18/2015  . Benzodiazepine dependence (HCC) 10/18/2015  . Chronic pain syndrome (WC) 10/18/2015  . Chronic upper back pain (WC) 10/18/2015  . Thoracic back pain (WC) 10/18/2015  . Hand pain 10/18/2015  . H/O cervical spine surgery 10/18/2015  . Chronic knee pain (Bilateral) 10/18/2015  . Chronic low back pain 10/18/2015  . Cervical spondylosis (WC) 10/18/2015  . Generalized anxiety disorder 10/18/2015  . Cervical facet syndrome (WC) (Bilateral) (L>R) 10/18/2015  . Diffuse arthralgia 10/18/2015  . Tobacco use disorder 10/18/2015  . Fibromyalgia 10/18/2015  .  History of anterior cervical discectomy and fusion (ACDF) (left side)  (WC) 10/18/2015  . Cervical radicular pain (WC) (Primary Area of Pain) (Left) 10/18/2015   Past Medical History:  Diagnosis Date  .  History of anterior cervical discectomy and fusion (ACDF) (left side). 10/18/2015  . Bilateral hip pain 10/18/2015  . Cervical spine pain (WC) 10/18/2015  . Cervical spondylosis with myelopathy   . Chronic cervical radiculopathy (WC) 10/18/2015  . Chronic constipation   . Chronic pain syndrome   . Depression   . Fibromyalgia   . GERD (gastroesophageal reflux disease)   . Insomnia   . Migraine   . Neck pain   . Panic attack     No family history on file.  Social History   Occupational History  . Occupation: Disability  Tobacco Use  . Smoking status: Current Every Day Smoker  . Smokeless tobacco: Never Used  Substance and Sexual Activity  . Alcohol use:  No    Alcohol/week: 0.0 standard drinks  . Drug use: No  . Sexual activity: Not on file

## 2019-03-11 ENCOUNTER — Ambulatory Visit: Payer: Self-pay | Admitting: Gerontology

## 2019-03-13 LAB — CYTOLOGY - PAP: Diagnosis: NEGATIVE

## 2019-03-30 ENCOUNTER — Telehealth (HOSPITAL_COMMUNITY): Payer: Self-pay | Admitting: *Deleted

## 2019-03-30 NOTE — Telephone Encounter (Signed)
Normal Pap smear result letter mailed to patient by Cytology. 

## 2019-04-28 ENCOUNTER — Other Ambulatory Visit: Payer: Self-pay

## 2019-04-28 ENCOUNTER — Ambulatory Visit: Payer: Self-pay | Admitting: Licensed Clinical Social Worker

## 2019-04-28 DIAGNOSIS — F411 Generalized anxiety disorder: Secondary | ICD-10-CM

## 2019-04-28 DIAGNOSIS — F331 Major depressive disorder, recurrent, moderate: Secondary | ICD-10-CM
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
## 2019-05-05 ENCOUNTER — Other Ambulatory Visit: Payer: Self-pay | Admitting: Psychiatry

## 2019-05-12 ENCOUNTER — Ambulatory Visit: Payer: Self-pay | Admitting: Licensed Clinical Social Worker

## 2019-05-12 ENCOUNTER — Other Ambulatory Visit: Payer: Self-pay

## 2019-05-12 DIAGNOSIS — F331 Major depressive disorder, recurrent, moderate: Secondary | ICD-10-CM

## 2019-05-12 DIAGNOSIS — F411 Generalized anxiety disorder: Secondary | ICD-10-CM

## 2019-05-12 NOTE — BH Specialist Note (Signed)
Integrated Behavioral Health Follow Up Phone Visit  MRN: [id number] Name: Teresa [name]  Number of Integrated Behavioral Health Clinician visits: 1/6  Type of Service: Integrated Behavioral Health- Individual/Family Interpretor:No. Interpretor Name and Language: Not applicable.  SUBJECTIVE: Teresa [name] is a 58 y.o. female accompanied by herself. Patient was referred by [name] NP at [facility] for mental health . Patient reports the following symptoms/concerns: She reports that she has been doing okay. She agreed to fill out a release of information for her pain doctor. She explains that she has been frustrated lately because of her sister staying with her and her dad. She describes being irritated with her sister's behaviors and mannerisms towards her and her dad. She notes that other than food stamps that she has no other income. She notes that she hasn't received worker's compensation for awhile. She explains that she discussed with her dad the fact that she doesn't feel safe around her sister and her sister isn't helping around the house. She denies suicidal and homicidal thoughts. Duration of problem: ; Severity of problem: moderate  OBJECTIVE: Mood: Euthymic and Affect: Appropriate Risk of harm to self or others: No plan to harm self or others  LIFE CONTEXT: Family and Social: see above. School/Work: Ms. Bettelyoun reports that she has applied for disability in the past but was turned down because of the worth of her car. Self-Care: see above. Life Changes: see above.  GOALS ADDRESSED: Patient will: 1.  Reduce symptoms of: anxiety  2.  Increase knowledge and/or ability of: coping skills and self-management skills  3.  Demonstrate ability to: Increase healthy adjustment to current life circumstances  INTERVENTIONS: Interventions utilized:  Brief CBT was utilized by the clinician focusing on the patient's anxiety and affect on normal cognition. Clinician processed  with the patient regarding how she has been doing since the last follow up session. Clinician explained that it sounds like since her sister came to stay with her and her dad that things have been problematic. Clinician explained that due to her dad owning the house that he has to set the boundaries and the rules for what's tolerated vs what's not tolerated. Clinician explained to the patient that it sounds like her dad is struggling to make a decision because he doesn't want to have to choose between his daughters. Clinician explained to the patient that in terms of interacting with her sister, to be civil, speak when spoken to, and try to keep the peace. Clinician encouraged the patient to continue to focus on gratitude, things that are in her control versus out of her control, and rely on her faith. Standardized Assessments completed: GAD-7 and PHQ 9  ASSESSMENT: Patient currently experiencing see above .   Patient may benefit from see above .  PLAN: 1. Follow up with behavioral health clinician on : two weeks or earlier if needed. 2. Behavioral recommendations: see above.  3. Referral(s): Integrated [facility] (In Clinic) 4. "From scale of 1-10, how likely are you to follow plan?": 9  Althia Forts, LCSW

## 2019-05-21 ENCOUNTER — Other Ambulatory Visit: Payer: Self-pay

## 2019-05-21 MED ORDER — DULOXETINE HCL 60 MG PO CPEP: 60.0000 mg | ORAL_CAPSULE | Freq: Every day | ORAL | 1 refills | Status: DC

## 2019-05-26 ENCOUNTER — Other Ambulatory Visit: Payer: Self-pay

## 2019-05-26 ENCOUNTER — Ambulatory Visit: Payer: Self-pay | Admitting: Licensed Clinical Social Worker

## 2019-05-26 DIAGNOSIS — F411 Generalized anxiety disorder: Secondary | ICD-10-CM

## 2019-05-26 DIAGNOSIS — F331 Major depressive disorder, recurrent, moderate: Secondary | ICD-10-CM
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
## 2019-06-02 ENCOUNTER — Other Ambulatory Visit: Payer: Self-pay

## 2019-06-02 ENCOUNTER — Ambulatory Visit: Payer: Self-pay | Admitting: Licensed Clinical Social Worker

## 2019-06-02 DIAGNOSIS — F331 Major depressive disorder, recurrent, moderate: Secondary | ICD-10-CM

## 2019-06-02 DIAGNOSIS — F411 Generalized anxiety disorder: Secondary | ICD-10-CM

## 2019-06-02 NOTE — BH Specialist Note (Signed)
Integrated Behavioral Health Follow Up Visit Via Phone  MRN: [id number] Name: Teresa [name]  Number of Integrated Behavioral Health Clinician visits: 3/6  Type of Service: Integrated Behavioral Health- Individual/Family Interpretor:No. Interpretor Name and Language: Not applicable.  SUBJECTIVE: Teresa [name] is a 58 y.o. female accompanied by herself. Patient was referred by [name] NP for mental health. Patient reports the following symptoms/concerns: She reports that she has been doing better since the last follow up session. She decided to foster a dog because a friend needed help. She notes that fostering this dog has lifted her spirits and gives her something to look forward to. She explains that she has been trying to walk away from her sister to avoid further conflict. She notes that her dad does not want her to move out. She reports that she wishes she had more positive things in her life. She denies suicidal and homicidal thoughts.  Duration of problem: ; Severity of problem: moderate  OBJECTIVE: Mood: Euthymic and Affect: Appropriate Risk of harm to self or others: No plan to harm self or others  LIFE CONTEXT: Family and Social: see above School/Work: see above Self-Care: see above Life Changes: see above  GOALS ADDRESSED: Patient will: 1.  Reduce symptoms of: depression  2.  Increase knowledge and/or ability of: coping skills and stress reduction  3.  Demonstrate ability to: Increase healthy adjustment to current life circumstances  INTERVENTIONS: Interventions utilized:  Brief CBT was utilized by the clinician focusing on her mood and affect on her behavior. Clinician processed with the patient regarding how she has been doing since the last follow up session. Clinician explained to the patient that it sounds like she is in better spirits this week compared to last week. Clinician explained to the patient that she thinks it's a great idea for her to foster a dog  to give her something to look forward to and have something positive in her life. Clinician explained to the patient that it sounds like she is allowing her sister's comments and insults to really get under her skin. Clinician explained to the patient that her sister seems to enjoy upsetting her and others so try to work on not giving her sister the satisfaction. Clinician suggested that the patient set healthy boundaries with her sister and others who are not as supportive.  Standardized Assessments completed: GAD-7 and PHQ 9  ASSESSMENT: Patient currently experiencing see above .   Patient may benefit from see above.  PLAN: 1. Follow up with behavioral health clinician on : two weeks or earlier if needed. 2. Behavioral recommendations: see above. 3. Referral(s): Integrated [facility] (In Clinic) 4. "From scale of 1-10, how likely are you to follow plan?":   Althia Forts, LCSW

## 2019-06-16 ENCOUNTER — Ambulatory Visit: Payer: Self-pay | Admitting: Licensed Clinical Social Worker

## 2019-06-16 ENCOUNTER — Other Ambulatory Visit: Payer: Self-pay

## 2019-06-16 DIAGNOSIS — F331 Major depressive disorder, recurrent, moderate: Secondary | ICD-10-CM

## 2019-06-16 DIAGNOSIS — F411 Generalized anxiety disorder: Secondary | ICD-10-CM

## 2019-06-16 NOTE — BH Specialist Note (Signed)
Integrated Behavioral Health Follow Up Visit Via Phone  MRN: [id number] Name: Teresa [name]  Number of [facility] Clinician visits: 4/6  Type of Service: [facility] Interpretor:No. Interpretor Name and Language: Not applicable.  SUBJECTIVE: Teresa [name] is a 58 y.o. female accompanied by herself. Patient was referred by Carlyon Shadow NP for mental health. Patient reports the following symptoms/concerns: She reports that she had a hard time sleeping last night due to pain. She notes that she is having a real hard time concentrating and its bothering her. She explains that she can't seem to retain any information after she has read something, is forgetful, and starts things but can't seem to finish them. She reports that she has been setting healthy boundaries by saying no when she is unable to do something. She notes that when things are unorganized that she gets overwhelmed and her anxiety goes up. She denies suicidal and homicidal thoughts.  Duration of problem: ; Severity of problem: moderate  OBJECTIVE: Mood: Euthymic and Affect: Appropriate Risk of harm to self or others: No plan to harm self or others  LIFE CONTEXT: Family and Social: See above. School/Work: see above. Self-Care: see above. Life Changes: see above.  GOALS ADDRESSED: Patient will: 1.  Reduce symptoms of: anxiety and insomnia  2.  Increase knowledge and/or ability of: coping skills  3.  Demonstrate ability to: Increase healthy adjustment to current life circumstances  INTERVENTIONS: Interventions utilized:  Brief CBT was utilized by the clinician focusing on her anxiety and affect on normal cognition. Clinician processed with the patient regarding how she has been doing since the last follow up session. Clinician asked the patient to explain her issues with concentration. Clinician suggested that the patient make a to do list, break each item on the list  into smaller steps, and then check things off as she completes them. Clinician provided psycho education to the patient that when a person does not get adequate sleep that they are unable to concentrate, and have problems with their memory. Clinician explained to the patient that it sounds like she wants to complete things all at once but when she is unable to do so that she feels guilty and frustrated. Clinician explained that it sounds like she ends up avoiding completing the tasks because she gets too worked up thinking about starting them. Clinician suggested that the patient write down key words from things that she has read to be able to process the information.  Standardized Assessments completed: GAD-7 and PHQ 9  ASSESSMENT: Patient currently experiencing see above.   Patient may benefit from see above.  PLAN: 1. Follow up with behavioral health clinician on : two weeks or earlier if needed. 2. Behavioral recommendations: see above. 3. Referral(s): [facility] (In Clinic) 4. "From scale of 1-10, how likely are you to follow plan?": Ball, LCSW

## 2019-06-30 ENCOUNTER — Ambulatory Visit: Payer: Self-pay | Admitting: Licensed Clinical Social Worker

## 2019-07-07 ENCOUNTER — Telehealth: Payer: Self-pay | Admitting: Licensed Clinical Social Worker

## 2019-07-07 NOTE — Telephone Encounter (Signed)
Clinician contacted the patient to see if she wanted to reschedule her appointment that she cancelled on June 30th. The voicemail box is full.

## 2019-07-14 ENCOUNTER — Telehealth: Payer: Self-pay | Admitting: Licensed Clinical Social Worker

## 2019-07-14 NOTE — Telephone Encounter (Signed)
Clinician reached out to the patient to see if she wanted to reschedule her last appointment that she had to cancel. She rescheduled her for July 23rd @ 2 pm. She informed the patient that the reason that they played phone tag is because at the time her voice mail box was full and was not accepting new messages.

## 2019-07-23 ENCOUNTER — Other Ambulatory Visit: Payer: Self-pay

## 2019-07-23 ENCOUNTER — Ambulatory Visit: Payer: Self-pay | Admitting: Licensed Clinical Social Worker

## 2019-07-23 DIAGNOSIS — F331 Major depressive disorder, recurrent, moderate: Secondary | ICD-10-CM

## 2019-07-23 DIAGNOSIS — F411 Generalized anxiety disorder: Secondary | ICD-10-CM

## 2019-07-28 ENCOUNTER — Telehealth: Payer: Self-pay | Admitting: Licensed Clinical Social Worker
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
## 2019-07-28 NOTE — Telephone Encounter (Signed)
Clinician contacted the patient to schedule her next follow up appointment due to at the time of most recent follow up appointment that there was problems with the Internet. She left a voicemail with call back information.

## 2019-08-06 ENCOUNTER — Telehealth: Payer: Self-pay | Admitting: Pharmacy Technician

## 2019-08-06 NOTE — Telephone Encounter (Signed)
Patient failed to provide requested financial documentation.  Financial documentation is required in order to determine patient's eligibility for [facility]'s program.  No additional medication assistance will be provided until patient provides requested financial documentation.  Patient notified by letter.  Harmonii Karle Pharmacy Technician/Eligibility Specialist [facility] 

## 2019-08-11 ENCOUNTER — Other Ambulatory Visit: Payer: Self-pay

## 2019-08-11 ENCOUNTER — Ambulatory Visit: Payer: Self-pay | Admitting: Licensed Clinical Social Worker

## 2019-08-11 DIAGNOSIS — F411 Generalized anxiety disorder: Secondary | ICD-10-CM

## 2019-08-11 DIAGNOSIS — F331 Major depressive disorder, recurrent, moderate: Secondary | ICD-10-CM
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
## 2019-08-21 ENCOUNTER — Ambulatory Visit: Payer: Self-pay

## 2019-08-21 ENCOUNTER — Other Ambulatory Visit: Payer: Self-pay

## 2019-08-21 ENCOUNTER — Other Ambulatory Visit: Payer: Self-pay | Admitting: Adult Health Nurse Practitioner

## 2019-08-21 ENCOUNTER — Ambulatory Visit: Payer: Self-pay | Admitting: Pharmacy Technician

## 2019-08-21 DIAGNOSIS — Z79899 Other long term (current) drug therapy: Secondary | ICD-10-CM

## 2019-08-21 NOTE — Progress Notes (Signed)
Completed [facility] application and contract.  Patient agreed to all terms of the [facility] contract.    Patient approved to receive medication assistance at [facility] as long as eligibility criteria continues to be met.    Provided patient with community resource material based on her particular needs.    [place] [facility]

## 2019-08-25 ENCOUNTER — Other Ambulatory Visit: Payer: Self-pay

## 2019-08-25 ENCOUNTER — Ambulatory Visit: Payer: Self-pay | Admitting: Licensed Clinical Social Worker

## 2019-08-25 DIAGNOSIS — F411 Generalized anxiety disorder: Secondary | ICD-10-CM

## 2019-08-25 DIAGNOSIS — F331 Major depressive disorder, recurrent, moderate: Secondary | ICD-10-CM
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
## 2019-09-02 ENCOUNTER — Ambulatory Visit: Payer: Self-pay | Admitting: Ophthalmology

## 2019-09-03 ENCOUNTER — Ambulatory Visit: Payer: Self-pay | Admitting: Ophthalmology

## 2019-09-08 ENCOUNTER — Ambulatory Visit: Payer: Self-pay | Admitting: Licensed Clinical Social Worker

## 2019-09-08 ENCOUNTER — Other Ambulatory Visit: Payer: Self-pay

## 2019-09-08 DIAGNOSIS — F411 Generalized anxiety disorder: Secondary | ICD-10-CM

## 2019-09-08 DIAGNOSIS — F331 Major depressive disorder, recurrent, moderate: Secondary | ICD-10-CM
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
## 2019-09-22 ENCOUNTER — Ambulatory Visit: Payer: Self-pay | Admitting: Licensed Clinical Social Worker

## 2019-09-22 ENCOUNTER — Other Ambulatory Visit: Payer: Self-pay

## 2019-09-22 DIAGNOSIS — F411 Generalized anxiety disorder: Secondary | ICD-10-CM

## 2019-09-22 DIAGNOSIS — F331 Major depressive disorder, recurrent, moderate: Secondary | ICD-10-CM
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
## 2019-10-08 ENCOUNTER — Ambulatory Visit: Payer: Self-pay | Admitting: Licensed Clinical Social Worker

## 2019-10-08 ENCOUNTER — Other Ambulatory Visit: Payer: Self-pay

## 2019-10-08 DIAGNOSIS — F331 Major depressive disorder, recurrent, moderate: Secondary | ICD-10-CM

## 2019-10-08 DIAGNOSIS — F411 Generalized anxiety disorder: Secondary | ICD-10-CM
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
## 2019-10-22 ENCOUNTER — Ambulatory Visit: Payer: Self-pay | Admitting: Licensed Clinical Social Worker

## 2019-10-22 ENCOUNTER — Other Ambulatory Visit: Payer: Self-pay

## 2019-10-22 DIAGNOSIS — F411 Generalized anxiety disorder: Secondary | ICD-10-CM

## 2019-10-22 DIAGNOSIS — F331 Major depressive disorder, recurrent, moderate: Secondary | ICD-10-CM

## 2019-10-22 NOTE — BH Specialist Note (Signed)
Integrated Behavioral Health Follow Up Visit Via Phone  MRN: [id number] Name: Teresa [name]  Type of Service: [place] Interpretor:No. Interpretor Name and Language: not applicable.   SUBJECTIVE: Teresa [name] is a 58 y.o. female accompanied by herself. Patient was referred by Carlyon Shadow NP for mental health.  Patient reports the following symptoms/concerns: She explains that she has been doing okay despite the chronic pain. She notes that her sister's son is getting married and she was not invited to the wedding. She explains that her dad told her that they were going to get a hotel room and she would drive. She notes that she set a healthy boundary with her dad explaining that she was not going to go. She reports that she told her dad that she was going to enjoy spending time by herself with the dog. She notes that she has been in better spirits and handling things a lot better. She notes that despite her anxiety that she is able to function. She denies suicidal and homicidal thoughts.  Duration of problem: ; Severity of problem: moderate  OBJECTIVE: Mood: Euthymic and Affect: Appropriate Risk of harm to self or others: No plan to harm self or others  LIFE CONTEXT: Family and Social: see above. School/Work: see above. Self-Care: see above. Life Changes: see above.   GOALS ADDRESSED: Patient will: 1.  Reduce symptoms of: anxiety  2.  Increase knowledge and/or ability of: stress reduction  3.  Demonstrate ability to: Increase healthy adjustment to current life circumstances  INTERVENTIONS: Interventions utilized:  Brief CBT was utilized by the clinician discussing the patient's improvement in her symptoms of anxiety. Clinician processed with the patient regarding how she has been doing since the last follow up session. Clinician explained to the patient that it sounds like she has been setting healthy boundaries with her family so to avoid  conflict and getting caught up in other people's drama. Clinician encouraged the patient to continue practice self care and focus on her self.  Standardized Assessments completed: GAD-7 and PHQ 9  ASSESSMENT: Patient currently experiencing see above.  Patient may benefit from see above.  PLAN: 1. Follow up with behavioral health clinician on : two weeks or earlier if needed.  2. Behavioral recommendations: see above.  3. Referral(s): [place] (In Clinic) 4. "From scale of 1-10, how likely are you to follow plan?":   Bayard Hugger, LCSW

## 2019-11-02 ENCOUNTER — Other Ambulatory Visit: Payer: Self-pay | Admitting: Adult Health Nurse Practitioner

## 2019-11-05 ENCOUNTER — Ambulatory Visit: Payer: Self-pay | Admitting: Licensed Clinical Social Worker

## 2019-11-05 ENCOUNTER — Telehealth: Payer: Self-pay | Admitting: Licensed Clinical Social Worker

## 2019-11-05 NOTE — Telephone Encounter (Signed)
Clinician attempted to contact the patient twice during their scheduled phone visit but an automated message popped up from [place] saying that their is calling restrictions and to try again.

## 2019-11-19 ENCOUNTER — Telehealth: Payer: Self-pay | Admitting: Pharmacist

## 2019-11-19 NOTE — Telephone Encounter (Signed)
11/19/2019 3:23:23 PM - Cymbalta   11/19/2019 Received a pharmacy printout from [place] to order Cymbalta--(Duloxetine currently not on [place]) Government social research officer patient her portion to sign & return, also taking provider portion to [facility] to sign--Cymbalta 60mg  Take one capsule by mouth every day #4 bottles.Delos Haring

## 2019-12-04 ENCOUNTER — Telehealth: Payer: Self-pay | Admitting: Pharmacist

## 2019-12-04 NOTE — Telephone Encounter (Signed)
12/04/2019 8:23:29 AM - Cymbalta  29/12/9164 Faxed [employer] application for enrollment--Cymbalta 60mg  Take one capsule by mouth every day.Delos Haring

## 2019-12-22 ENCOUNTER — Telehealth: Payer: Self-pay | Admitting: Licensed Clinical Social Worker

## 2019-12-22 NOTE — Telephone Encounter (Signed)
Clinician left the patient a voicemail with call back information about rescheduling a missed appointment.

## 2020-03-30 ENCOUNTER — Telehealth: Payer: Self-pay | Admitting: Pharmacy Technician

## 2020-03-30 NOTE — Telephone Encounter (Signed)
Patient has prescription drug coverage with [employer].  No longer meets [facility]'s eligibility criteria.  Patient notified by letter.  Teresa [name] J. Teresa [name] Care Manager [facility] 

## 2020-06-01 ENCOUNTER — Ambulatory Visit: Payer: Self-pay

## 2020-06-28 ENCOUNTER — Emergency Department (INDEPENDENT_AMBULATORY_CARE_PROVIDER_SITE_OTHER)
Admission: EM | Admit: 2020-06-28 | Discharge: 2020-06-28 | Disposition: A | Discharge status: Home / Self Care | Payer: Self-pay | Source: Home / Self Care | Attending: Family Medicine | Admitting: Family Medicine

## 2020-06-28 ENCOUNTER — Other Ambulatory Visit: Payer: Self-pay

## 2020-06-28 ENCOUNTER — Emergency Department (INDEPENDENT_AMBULATORY_CARE_PROVIDER_SITE_OTHER): Payer: 59

## 2020-06-28 DIAGNOSIS — M542 Cervicalgia: Secondary | ICD-10-CM

## 2020-06-28 DIAGNOSIS — M546 Pain in thoracic spine: Secondary | ICD-10-CM | POA: Diagnosis not present

## 2020-06-28 DIAGNOSIS — M5441 Lumbago with sciatica, right side: Secondary | ICD-10-CM

## 2020-06-28 DIAGNOSIS — M545 Low back pain: Secondary | ICD-10-CM | POA: Diagnosis not present

## 2020-06-28 DIAGNOSIS — M5412 Radiculopathy, cervical region: Secondary | ICD-10-CM

## 2020-06-28 MED ORDER — ACETAMINOPHEN 325 MG PO TABS
650.0000 mg | ORAL_TABLET | Freq: Four times a day (QID) | ORAL | Status: DC | PRN
  Administered 2020-06-28: 650 mg via ORAL

## 2020-06-28 MED ORDER — METAXALONE 800 MG PO TABS
ORAL_TABLET | ORAL | 0 refills | Status: DC
Start: 2020-06-28 — End: 2020-07-22

## 2020-06-28 MED ORDER — PREDNISONE 20 MG PO TABS: ORAL_TABLET | ORAL | 0 refills | Status: DC
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
## 2020-06-28 NOTE — Discharge Instructions (Addendum)
Apply ice pack to neck and lower back for 20 to 30 minutes, 3 to 4 times daily  Continue until pain and swelling decrease.  After beginning prednisone, may take Tylenol as needed for pain.
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
## 2020-07-18 ENCOUNTER — Other Ambulatory Visit: Payer: Self-pay

## 2020-07-18 ENCOUNTER — Emergency Department
Admission: EM | Admit: 2020-07-18 | Discharge: 2020-07-18 | Disposition: A | Discharge status: Home / Self Care | Payer: 59 | Source: Home / Self Care

## 2020-07-18 ENCOUNTER — Emergency Department (INDEPENDENT_AMBULATORY_CARE_PROVIDER_SITE_OTHER): Payer: 59

## 2020-07-18 DIAGNOSIS — S5001XA Contusion of right elbow, initial encounter: Secondary | ICD-10-CM

## 2020-07-18 DIAGNOSIS — M25521 Pain in right elbow: Secondary | ICD-10-CM

## 2020-07-18 NOTE — ED Triage Notes (Signed)
Patient presents to Urgent Care with complaints of right arm pain since about 3 weeks ago. Patient reports she was put on a muscle relaxer and prednisone when she was seen last, has not followed up w/ sports med as directed. Pt states her right arm pain starts in her right elbow and radiates down to her hand. Imaging was negative during last visit. Pt has taken tylenol for the pain, no improvement.
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
## 2020-07-18 NOTE — Discharge Instructions (Signed)
  You may take 500mg  acetaminophen every 4-6 hours or in combination with ibuprofen 400-600mg  every 6-8 hours as needed for pain and inflammation.  You should also alternate cool and warm compresses for comfort. You may wear the elbow sleeve as needed for comfort.  Call tomorrow to schedule an appointment with Dr. , Sports Medicine this week. You do not need an official referral. His office is able to see our notes.

## 2020-07-22 ENCOUNTER — Encounter: Payer: Self-pay | Admitting: Sports Medicine

## 2020-07-22 ENCOUNTER — Ambulatory Visit (INDEPENDENT_AMBULATORY_CARE_PROVIDER_SITE_OTHER): Payer: 59 | Admitting: Sports Medicine

## 2020-07-22 DIAGNOSIS — M25521 Pain in right elbow: Secondary | ICD-10-CM | POA: Insufficient documentation
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
## 2020-08-22 ENCOUNTER — Ambulatory Visit (INDEPENDENT_AMBULATORY_CARE_PROVIDER_SITE_OTHER): Payer: Self-pay | Admitting: Sports Medicine

## 2020-08-22 DIAGNOSIS — M7061 Trochanteric bursitis, right hip: Secondary | ICD-10-CM

## 2020-08-22 DIAGNOSIS — M545 Low back pain, unspecified: Secondary | ICD-10-CM

## 2020-08-22 DIAGNOSIS — M25521 Pain in right elbow: Secondary | ICD-10-CM

## 2020-08-22 DIAGNOSIS — G8929 Other chronic pain: Secondary | ICD-10-CM

## 2020-08-22 DIAGNOSIS — M25551 Pain in right hip: Secondary | ICD-10-CM | POA: Insufficient documentation

## 2020-08-22 NOTE — Progress Notes (Signed)
    Procedures performed today:    None.  Independent interpretation of notes and tests performed by another provider:   None.  Brief History, Exam, Impression, and Recommendations:    Right elbow pain Teresa [name] returns, she had traumatic common extensor tendinitis of her right elbow, we did an injection at the last visit, tennis elbow brace and she returns pain-free. She can continue her rehab. She does have a case open with the insurance company.  Trochanteric bursitis, right hip Center also has some pain that she has is over her right lateral hip, clinically she has tenderness directly over her greater trochanter, we will start conservatively with some rehabilitation exercises, return to see me in 1 month, injection if no better.  Chronic low back pain Teresa [name] also has moderate pain in her left low back, described as burning and worse when standing, clinically this is over the SI joint, SI joint rehab given, if no better in a month we will try diagnostic and therapeutic SI joint injection.    ___________________________________________ [name]. Benjamin Stain, M.D., ABFM., CAQSM. Primary Care and Sports Medicine [facility]  Adjunct Instructor of Family Medicine  [employer]

## 2020-08-22 NOTE — Assessment & Plan Note (Signed)
Teresa [name] returns, she had traumatic common extensor tendinitis of her right elbow, we did an injection at the last visit, tennis elbow brace and she returns pain-free. She can continue her rehab. She does have a case open with the insurance company.

## 2020-08-22 NOTE — Assessment & Plan Note (Signed)
Riddhi also has moderate pain in her left low back, described as burning and worse when standing, clinically this is over the SI joint, SI joint rehab given, if no better in a month we will try diagnostic and therapeutic SI joint injection.

## 2020-08-22 NOTE — Assessment & Plan Note (Signed)
Center also has some pain that she has is over her right lateral hip, clinically she has tenderness directly over her greater trochanter, we will start conservatively with some rehabilitation exercises, return to see me in 1 month, injection if no better.

## 2020-08-23 ENCOUNTER — Encounter: Payer: Self-pay | Admitting: Sports Medicine

## 2020-08-23 ENCOUNTER — Telehealth: Payer: Self-pay

## 2020-08-23 NOTE — Telephone Encounter (Signed)
Teresa [name] states since she started the home exercise. She states she is unable to work today. She would like a note for work. She isn't sure when she will be able to return but will try to return to work tomorrow.

## 2020-08-23 NOTE — Telephone Encounter (Signed)
Left message advising of recommendations.  

## 2020-08-23 NOTE — Telephone Encounter (Signed)
Letter written and in chart 

## 2020-08-31 ENCOUNTER — Other Ambulatory Visit: Payer: Self-pay

## 2020-08-31 ENCOUNTER — Ambulatory Visit (INDEPENDENT_AMBULATORY_CARE_PROVIDER_SITE_OTHER): Payer: Self-pay | Admitting: Sports Medicine

## 2020-08-31 DIAGNOSIS — M4310 Spondylolisthesis, site unspecified: Secondary | ICD-10-CM

## 2020-08-31 DIAGNOSIS — M4316 Spondylolisthesis, lumbar region: Secondary | ICD-10-CM

## 2020-08-31 NOTE — Assessment & Plan Note (Addendum)
America returns, we filled out FMLA paperwork today to keep her out until 8 September and then intermittent leave for appointments for the next 3 months after that. FMLA paperwork will be scanned into the media tab and original given to patient, will also be faxed to the requesting agency. She does have L4-L5 spondylolisthesis with degenerative disc disease at multiple other levels. Her pain is predominantly axial, with radiation around the right lateral thigh, anterior knee. She has failed greater than 6 weeks of conservative measures and does have some progressive weakness, we are also planning an epidural. Proceeding with MRI, additional aggressive formal physical therapy. Return to see me in 4 to 6 weeks.

## 2020-08-31 NOTE — Progress Notes (Signed)
    Procedures performed today:    None.  Independent interpretation of notes and tests performed by another provider:   Lumbar spine x-rays personally reviewed, there is grade 1 anterolisthesis of L4 and L5.  There is widespread degenerative facet arthritis at the levels.  Brief History, Exam, Impression, and Recommendations:    Spondylolisthesis at L4-L5 level Katerin returns, we filled out FMLA paperwork today to keep her out until 8 September and then intermittent leave for appointments for the next 3 months after that. FMLA paperwork will be scanned into the media tab and original given to patient, will also be faxed to the requesting agency. She does have L4-L5 spondylolisthesis with degenerative disc disease at multiple other levels. Her pain is predominantly axial, with radiation around the right lateral thigh, anterior knee. She has failed greater than 6 weeks of conservative measures and does have some progressive weakness, we are also planning an epidural. Proceeding with MRI, additional aggressive formal physical therapy. Return to see me in 4 to 6 weeks.     ___________________________________________ [name]. Benjamin Stain, M.D., ABFM., CAQSM. Primary Care and Sports Medicine [place]  Adjunct Instructor of Family Medicine  [employer]

## 2020-09-16 ENCOUNTER — Other Ambulatory Visit: Payer: Self-pay

## 2020-09-16 ENCOUNTER — Ambulatory Visit: Payer: 59 | Admitting: Rehabilitative and Restorative Service Providers"

## 2020-09-16 ENCOUNTER — Ambulatory Visit (INDEPENDENT_AMBULATORY_CARE_PROVIDER_SITE_OTHER): Payer: 59 | Admitting: Rehabilitative and Restorative Service Providers"

## 2020-09-16 DIAGNOSIS — M5441 Lumbago with sciatica, right side: Secondary | ICD-10-CM | POA: Diagnosis not present

## 2020-09-16 DIAGNOSIS — M6281 Muscle weakness (generalized): Secondary | ICD-10-CM | POA: Diagnosis not present

## 2020-09-16 DIAGNOSIS — R29898 Other symptoms and signs involving the musculoskeletal system: Secondary | ICD-10-CM

## 2020-09-16 NOTE — Patient Instructions (Addendum)
Access Code: [email or web address]: [email or web address]: 09/17/2021Prepared by: Trenyce Loera HoltExercises  Prone Quadriceps Stretch with Strap - 2 x daily - 7 x weekly - 1 sets - 3 reps - 30 sec hold  Hooklying Single Knee to Chest Stretch - 2 x daily - 7 x weekly - 1 sets - 3 reps - 30 sec hold  Hooklying Hamstring Stretch with Strap - 2 x daily - 7 x weekly - 1 sets - 3 reps - 30 sec hold  Supine Piriformis Stretch with Leg Straight - 2 x daily - 7 x weekly - 1 sets - 3 reps - 30 sec hold  Supine Transversus Abdominis Bracing with Pelvic Floor Contraction - 2 x daily - 7 x weekly - 1 sets - 10 reps - 10sec hold Patient Education  TENS Unit  Posture and Body Mechanics     Aquatic Therapy: What to Expect!  Where:  [facility]  [address] [place], [place]  [address] [phone]    NOTE: Bonita Quin will receive an automated phone message  reminding you of your appointment and it will say the   appointment is at [facility] in [place]. We are  working to fix this - just know that you will meet [place] at pool!       How to Prepare: . Please make sure you drink 8 ounces of water about one hour prior to your pool session. . Sign in at the front desk upon your arrival.  Once on the pool deck, your therapist will ask you to sign the Patient  Consent and Assignment of Benefits form. . If you have a caregiver, they must attend the entire session with you (unless your primary therapists feels this is not necessary). The caregiver will be responsible for assisting with dressing as well as any toileting needs.  . Please arrive IN YOUR SUIT and a few minutes prior to your appointment - this helps to avoid delays in starting your session. . Please make sure to attend to any toileting needs prior to entering the pool. . You can bring your pool bag with you and place it on bench near our work space.   . Your therapist may take your blood pressure prior to, during, and after  your session if indicated. . We usually try and create a home exercise program based on activities we do in the pool.  Please be thinking about who might be able to assist you in the pool should you want to participate in an aquatic home exercise program at the time of discharge.  Some patients do not want to or do not have the ability to participate in an aquatic home program - this is not a barrier in any way to you participating in aquatic therapy as part of your current therapy plan! About the pool: 1. Entering the pool: Your therapist will assist you; there are multiple ways to enter including stairs with railings, a walk-in ramp, a roll-in chair and a mechanical lift. Your therapist will determine the most appropriate way for you. 2. Water temperature is usually between 86-87 degrees. 3. There may be other swimmers in the pool at the same time. 4. All sessions are 45 minutes.  Contact Info:  [facility] -  [address], [address] [place], [place] [address]     Please call our [facility] office if you need to cancel or reschedule.   [phone]
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
## 2020-09-19 ENCOUNTER — Ambulatory Visit: Payer: 59 | Admitting: Sports Medicine

## 2020-09-22 ENCOUNTER — Encounter: Payer: Self-pay | Admitting: Physical Therapy

## 2020-09-22 ENCOUNTER — Other Ambulatory Visit: Payer: Self-pay

## 2020-09-22 ENCOUNTER — Ambulatory Visit (INDEPENDENT_AMBULATORY_CARE_PROVIDER_SITE_OTHER): Payer: 59 | Admitting: Physical Therapy

## 2020-09-22 DIAGNOSIS — R29898 Other symptoms and signs involving the musculoskeletal system: Secondary | ICD-10-CM

## 2020-09-22 DIAGNOSIS — M6281 Muscle weakness (generalized): Secondary | ICD-10-CM

## 2020-09-22 DIAGNOSIS — M5441 Lumbago with sciatica, right side: Secondary | ICD-10-CM

## 2020-09-22 NOTE — Therapy (Signed)
[facility] [address] [place] [address] [place], [place], [address] Phone: [phone]   Fax:  [phone]  Physical Therapy Treatment  Patient Details  Name: Teresa [name] MRN: [id number] Date of Birth: 1961/04/12 Referring Provider (PT): Dr Benjamin Stain   Encounter Date: 09/22/2020   PT End of Session - 09/22/20 1703    Visit Number 2    Number of Visits 12    Date for PT Re-Evaluation 10/28/20    PT Start Time 1703    PT Stop Time 1751    PT Time Calculation (min) 48 min    Activity Tolerance Patient tolerated treatment well    Behavior During Therapy [facility] for tasks assessed/performed           Past Medical History:  Diagnosis Date  .  History of anterior cervical discectomy and fusion (ACDF) (left side). 10/18/2015  . Bilateral hip pain 10/18/2015  . Cervical spine pain (WC) 10/18/2015  . Cervical spondylosis with myelopathy   . Chronic cervical radiculopathy (WC) 10/18/2015  . Chronic constipation   . Chronic pain syndrome   . Depression   . Fibromyalgia   . GERD (gastroesophageal reflux disease)   . Insomnia   . Migraine   . Neck pain   . Panic attack     Past Surgical History:  Procedure Laterality Date  . btl    . NECK SURGERY    . TUBAL LIGATION Bilateral     There were no vitals filed for this visit.   Subjective Assessment - 09/22/20 1710    Subjective Pt reports she had increased pain after eval for a couple of days.  She states she has performed the exercises since visit, but has stayed busy with work and caring for dad.    Pertinent History WC injury to head and neck 2011; cervical fusion C3/4/5 in 2014; headaches; Rt lateral elbow pain    Patient Stated Goals get stronger and to get rid of the pain    Currently in Pain? Yes    Pain Score 2    took hydrocodone 1 hr prior to session.   Pain Location Back    Pain Orientation Left;Right;Lower    Pain Descriptors / Indicators Aching    Pain Radiating  Towards into bilat hips    Pain Onset More than a month ago    Aggravating Factors  prolonged standing in one spot.    Pain Relieving Factors medication              [facility] PT Assessment - 09/22/20 0001      Assessment   Medical Diagnosis LBP with Rt LE radicular pain     Referring Provider (PT) Dr Benjamin Stain    Onset Date/Surgical Date 06/26/20    Hand Dominance Right    Next MD Visit 09/28/20    Prior Therapy PT for neck - aquatic therapy            [facility] Adult PT Treatment/Exercise - 09/22/20 0001      Self-Care   Self-Care Other Self-Care Comments    Other Self-Care Comments  Pt educated on self massage with roller and ball to LE and low back; pt returned demo with cues.       Lumbar Exercises: Stretches   Passive Hamstring Stretch Right;Left;3 reps;20 seconds   standing with foot on 8" step, hip hinge; and supine   Single Knee to Chest Stretch Right;Left;1 rep;20 seconds    Hip Flexor Stretch Right;Left;1 rep;30 seconds  Standing Extension 2 reps;5 seconds    Quad Stretch Right;Left;1 rep;30 seconds   standing   Piriformis Stretch Right;Left;1 rep;30 seconds   trial of seated version   Other Lumbar Stretch Exercise L stretch with hands on railing x 2 reps of 10 sec, knees bent.       Lumbar Exercises: Aerobic   Nustep L4-5: 6.14min; cues to increase speed.       Lumbar Exercises: Standing   Other Standing Lumbar Exercises Pt demonstrated 1 rep of current workplace pre-work stretches (including touching toes, etc).       Lumbar Exercises: Seated   Other Seated Lumbar Exercises reviewed TA contraction per HEP; pt performed 2 reps       Moist Heat Therapy   Number Minutes Moist Heat 10 Minutes    Moist Heat Location Lumbar Spine      Electrical Stimulation   Electrical Stimulation Location bilat lower lumbar and SI    Electrical Stimulation Action IFC    Electrical Stimulation Parameters 10 min, intensity to tolerance     Electrical Stimulation Goals Pain                    PT Education - 09/22/20 1940    Education Details HEP    Person(s) Educated Patient    Methods Explanation;Handout;Verbal cues;Demonstration    Comprehension Returned demonstration;Verbalized understanding               PT Long Term Goals - 09/16/20 1606      PT LONG TERM GOAL #1   Title Decrease pain in LB and Rt LE by 30-50% allowing patient to participate in ~ 30 min of exercise with minimal to no increase in symptoms    Time 6    Period Weeks    Status New    Target Date 10/28/20      PT LONG TERM GOAL #2   Title Increase functional strength with patient to report ability to stand, walk, lift, reach for work tasks with minimal to no increase in pain    Time 6    Period Weeks    Status New    Target Date 10/28/20      PT LONG TERM GOAL #3   Title Patient to demonstrate and verbalize understanding and ability to continue with aquatic therapy program in community based pool as indicated    Time 6    Period Weeks    Status New    Target Date 10/28/20      PT LONG TERM GOAL #4   Title Independent in HEP    Time 6    Period Weeks    Status New    Target Date 10/28/20      PT LONG TERM GOAL #5   Title Improve FOTO to </= 35% lmitation    Time 6    Period Weeks    Status New    Target Date 10/28/20                 Plan - 09/22/20 1708    Clinical Impression Statement Pt shown alternative stretches to the company stretches she performs daily at work; tolerated well.  Pt required minor cues on form.  Encouraged to avoid forward flexion to touch toes for work stretch, since this seems to irritate low back.   Goals are ongoing.    Rehab Potential Good    PT Frequency 2x / week    PT Duration 6 weeks  PT Treatment/Interventions ADLs/Self Care Home Management;Aquatic Therapy;Cryotherapy;Electrical Stimulation;Iontophoresis 4mg /ml Dexamethasone;Moist Heat;Ultrasound;Functional mobility training;Therapeutic activities;Therapeutic  exercise;Balance training;Neuromuscular re-education;Patient/family education;Manual techniques;Dry needling;Taping    PT Next Visit Plan progress with stretching, core strengthening and stabilization; back care and body mechanics; manual work and modalities as indicated; aquatic therapy    PT Home Exercise Plan [email or web address]    Consulted and Agree with Plan of Care Patient           Patient will benefit from skilled therapeutic intervention in order to improve the following deficits and impairments:     Visit Diagnosis: Acute bilateral low back pain with right-sided sciatica  Muscle weakness (generalized)  Other symptoms and signs involving the musculoskeletal system     Problem List Patient Active Problem List   Diagnosis Date Noted  . Spondylolisthesis at L4-L5 level 08/31/2020  . Trochanteric bursitis, right hip 08/22/2020  . Right elbow pain 07/22/2020  . Elevated BP without diagnosis of hypertension 09/25/2018  . Cervicalgia 03/24/2018  . 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 DDD (degenerative disc disease), cervical 03/24/2018  . Spondylosis without myelopathy or radiculopathy, cervical region 03/10/2018  . Vitamin D insufficiency 03/10/2018  . Acute postoperative pain 01/16/2018  . Cervicogenic headache (WC) 10/30/2017  . GERD (gastroesophageal reflux disease) 09/20/2017  . Abnormal MRI, cervical spine (04/16/2017) 07/02/2017  . Weakness of left leg 06/27/2017  . Depression 04/04/2017  . Disturbance of skin sensation (Left side of face) 02/13/2017  . Muscle spasms of lower extremity 09/19/2016  . Chronic hip pain (Bilateral) 02/01/2016  . Chronic shoulder pain (Secondary Area of Pain) (Bilateral) (L>R) 02/01/2016  . Abnormal UDS (12/01/2015) 12/12/2015  . Work related injury (DOI: 07/20/2010) 12/05/2015  . Neurogenic pain 12/05/2015  . Neuropathic pain 12/05/2015  . Healthcare maintenance 12/01/2015  . 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 Chronic neck pain (WC) (Secondary Area of Pain) (Bilateral) (L>R) 12/01/2015  . Musculoskeletal pain 12/01/2015  . Muscle spasms of neck 12/01/2015  . History of panic attacks 10/18/2015  . Encounter for therapeutic drug level monitoring 10/18/2015  . Benzodiazepine dependence (HCC) 10/18/2015  . Chronic pain syndrome (WC) 10/18/2015  . Chronic upper back pain (WC) 10/18/2015  . Thoracic back pain (WC) 10/18/2015  . Hand pain 10/18/2015  . H/O cervical spine surgery 10/18/2015  . Chronic knee pain (Bilateral) 10/18/2015  . Chronic low back pain 10/18/2015  . Cervical spondylosis (WC) 10/18/2015  . Generalized anxiety disorder 10/18/2015  . Cervical facet syndrome (WC) (Bilateral) (L>R) 10/18/2015  . Diffuse arthralgia 10/18/2015  . Tobacco use disorder 10/18/2015  . Fibromyalgia 10/18/2015  .  History of anterior cervical discectomy and fusion (ACDF) (left side)  (WC) 10/18/2015  . Cervical radicular pain (WC) (Primary Area of Pain) (Left) 10/18/2015   10/20/2015, PTA 09/22/20 6:36 PM  [facility] [place] [address] [address] [place], [place], [place] Phone: [phone]   Fax:  [phone]  Name: QUIERA DIFFEE MRN: Domingo Pulse Date of Birth: Oct 16, 1961

## 2020-09-25 ENCOUNTER — Other Ambulatory Visit: Payer: Self-pay

## 2020-09-25 ENCOUNTER — Ambulatory Visit (INDEPENDENT_AMBULATORY_CARE_PROVIDER_SITE_OTHER): Payer: 59

## 2020-09-25 DIAGNOSIS — M4316 Spondylolisthesis, lumbar region: Secondary | ICD-10-CM | POA: Diagnosis not present

## 2020-09-26 ENCOUNTER — Encounter: Payer: Self-pay | Admitting: Rehabilitative and Restorative Service Providers"

## 2020-09-26 ENCOUNTER — Ambulatory Visit (INDEPENDENT_AMBULATORY_CARE_PROVIDER_SITE_OTHER): Payer: 59 | Admitting: Rehabilitative and Restorative Service Providers"

## 2020-09-26 DIAGNOSIS — M6281 Muscle weakness (generalized): Secondary | ICD-10-CM | POA: Diagnosis not present

## 2020-09-26 DIAGNOSIS — R29898 Other symptoms and signs involving the musculoskeletal system: Secondary | ICD-10-CM

## 2020-09-26 DIAGNOSIS — M5441 Lumbago with sciatica, right side: Secondary | ICD-10-CM

## 2020-09-26 NOTE — Patient Instructions (Addendum)
Access Code: [email or web address]: [email or web address]: 09/27/2021Prepared by: Kelcie Currie HoltExercises  Prone Quadriceps Stretch with Strap - 2 x daily - 7 x weekly - 1 sets - 3 reps - 30 sec hold  Hooklying Single Knee to Chest Stretch - 2 x daily - 7 x weekly - 1 sets - 3 reps - 30 sec hold  Hooklying Hamstring Stretch with Strap - 2 x daily - 7 x weekly - 1 sets - 3 reps - 30 sec hold  Supine Piriformis Stretch with Leg Straight - 2 x daily - 7 x weekly - 1 sets - 3 reps - 30 sec hold  Supine Transversus Abdominis Bracing with Pelvic Floor Contraction - 2 x daily - 7 x weekly - 1 sets - 10 reps - 10sec hold  Dead Bug - 2 x daily - 7 x weekly - 1-2 sets - 10 reps - 2 sec hold  Bridge - 2 x daily - 7 x weekly - 1-2 sets - 10 reps - 5 sec hold  Hooklying Isometric Clamshell - 2 x daily - 7 x weekly - 1 sets - 10 reps - 3 sec hold  Sit to Stand - 2 x daily - 7 x weekly - 1 sets - 10 reps - 3-5 sec hold    Trigger Point Dry Needling  . What is Trigger Point Dry Needling (DN)? o DN is a physical therapy technique used to treat muscle pain and dysfunction. Specifically, DN helps deactivate muscle trigger points (muscle knots).  o A thin filiform needle is used to penetrate the skin and stimulate the underlying trigger point. The goal is for a local twitch response (LTR) to occur and for the trigger point to relax. No medication of any kind is injected during the procedure.   . What Does Trigger Point Dry Needling Feel Like?  o The procedure feels different for each individual patient. Some patients report that they do not actually feel the needle enter the skin and overall the process is not painful. Very mild bleeding may occur. However, many patients feel a deep cramping in the muscle in which the needle was inserted. This is the local twitch response.   Marland Kitchen How Will I feel after the treatment? o Soreness is normal, and the onset of soreness may not occur for a few hours. Typically this  soreness does not last longer than two days.  o Bruising is uncommon, however; ice can be used to decrease any possible bruising.  o In rare cases feeling tired or nauseous after the treatment is normal. In addition, your symptoms may get worse before they get better, this period will typically not last longer than 24 hours.   . What Can I do After My Treatment? o Increase your hydration by drinking more water for the next 24 hours. o You may place ice or heat on the areas treated that have become sore, however, do not use heat on inflamed or bruised areas. Heat often brings more relief post needling. o You can continue your regular activities, but vigorous activity is not recommended initially after the treatment for 24 hours. o DN is best combined with other physical therapy such as strengthening, stretching, and other therapies.

## 2020-09-26 NOTE — Therapy (Addendum)
[facility] [place]  [address] [place], [place], [address] Phone: [phone]   Fax:  [phone]  Physical Therapy Treatment  Patient Details  Name: Teresa [name] MRN: [id number] Date of Birth: [birth year] Referring Provider (PT): Dr Dianah Field   Encounter Date: 09/26/2020   PT End of Session - 09/26/20 1509    Visit Number 3    Number of Visits 12    Date for PT Re-Evaluation 10/28/20    PT Start Time 1509    PT Stop Time 1557    PT Time Calculation (min) 48 min    Activity Tolerance Patient tolerated treatment well           Past Medical History:  Diagnosis Date  .  History of anterior cervical discectomy and fusion (ACDF) (left side). 10/18/2015  . Bilateral hip pain 10/18/2015  . Cervical spine pain (WC) 10/18/2015  . Cervical spondylosis with myelopathy   . Chronic cervical radiculopathy (WC) 10/18/2015  . Chronic constipation   . Chronic pain syndrome   . Depression   . Fibromyalgia   . GERD (gastroesophageal reflux disease)   . Insomnia   . Migraine   . Neck pain   . Panic attack     Past Surgical History:  Procedure Laterality Date  . btl    . NECK SURGERY    . TUBAL LIGATION Bilateral     There were no vitals filed for this visit.   Subjective Assessment - 09/26/20 1511    Subjective Patient reports that she is tired today. She did not sleep well last night and is busy at work.    Currently in Pain? Yes    Pain Score 3     Pain Location Back    Pain Orientation Left;Right;Lower    Pain Descriptors / Indicators Aching    Pain Type Acute pain    Pain Onset More than a month ago                             [facility] Adult PT Treatment/Exercise - 09/26/20 0001      Lumbar Exercises: Stretches   Passive Hamstring Stretch Right;Left;3 reps;20 seconds   standing with foot on 8" step, hip hinge; and supine   Single Knee to Chest Stretch Right;Left;2 reps;20 seconds    Standing  Extension 2 reps;5 seconds    Piriformis Stretch Right;Left;2 reps;30 seconds   supine travell      Lumbar Exercises: Aerobic   Nustep L 5 x 5mn      Lumbar Exercises: Seated   Sit to Stand --   12 reps core engaged hinged hip    Sit to Stand Limitations from ~ 24 inch height       Lumbar Exercises: Supine   Bent Knee Raise 10 reps;2 seconds    Dead Bug 10 reps;2 seconds    Bridge 10 reps;3 seconds    Other Supine Lumbar Exercises alternate shoulder flexion core engaged x 5 reps each side     Other Supine Lumbar Exercises hip abduction green TB alternating LE's 2-3 sec hold VC for core engaged       Moist Heat Therapy   Number Minutes Moist Heat 14 Minutes    Moist Heat Location Lumbar Spine      Electrical Stimulation   Electrical Stimulation Location bilat lower lumbar and SI    Electrical Stimulation Action IFC    Electrical Stimulation Parameters to  tolerance     Electrical Stimulation Goals Pain                  PT Education - 09/26/20 1517    Education Details DN HEP    Person(s) Educated Patient    Methods Explanation;Handout    Comprehension Verbalized understanding               PT Long Term Goals - 09/16/20 1606      PT LONG TERM GOAL #1   Title Decrease pain in LB and Rt LE by 30-50% allowing patient to participate in ~ 30 min of exercise with minimal to no increase in symptoms    Time 6    Period Weeks    Status New    Target Date 10/28/20      PT LONG TERM GOAL #2   Title Increase functional strength with patient to report ability to stand, walk, lift, reach for work tasks with minimal to no increase in pain    Time 6    Period Weeks    Status New    Target Date 10/28/20      PT LONG TERM GOAL #3   Title Patient to demonstrate and verbalize understanding and ability to continue with aquatic therapy program in community based pool as indicated    Time 6    Period Weeks    Status New    Target Date 10/28/20      PT LONG TERM GOAL #4    Title Independent in HEP    Time 6    Period Weeks    Status New    Target Date 10/28/20      PT LONG TERM GOAL #5   Title Improve FOTO to </= 35% lmitation    Time 6    Period Weeks    Status New    Target Date 10/28/20                 Plan - 09/26/20 1536    Clinical Impression Statement Persistent pain in the LB with work and caring for her parents who live in [place] and different areas. Patient has continued muscular tightness to palpation. Added core stabilization exercises in supine and sit to stand without increase in symptoms.    Rehab Potential Good    PT Frequency 2x / week    PT Duration 6 weeks    PT Treatment/Interventions ADLs/Self Care Home Management;Aquatic Therapy;Cryotherapy;Electrical Stimulation;Iontophoresis 52m/ml Dexamethasone;Moist Heat;Ultrasound;Functional mobility training;Therapeutic activities;Therapeutic exercise;Balance training;Neuromuscular re-education;Patient/family education;Manual techniques;Dry needling;Taping    PT Next Visit Plan progress with stretching, core strengthening and stabilization; back care and body mechanics; manual work and modalities as indicated; aquatic therapy    PT Home Exercise Plan [email or web address]    Consulted and Agree with Plan of Care Patient           Patient will benefit from skilled therapeutic intervention in order to improve the following deficits and impairments:     Visit Diagnosis: Acute bilateral low back pain with right-sided sciatica  Muscle weakness (generalized)  Other symptoms and signs involving the musculoskeletal system     Problem List Patient Active Problem List   Diagnosis Date Noted  . Spondylolisthesis at L4-L5 level 08/31/2020  . Trochanteric bursitis, right hip 08/22/2020  . Right elbow pain 07/22/2020  . Elevated BP without diagnosis of hypertension 09/25/2018  . Cervicalgia 03/24/2018  . 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 DDD (degenerative  disc disease), cervical 03/24/2018  .  Spondylosis without myelopathy or radiculopathy, cervical region 03/10/2018  . Vitamin D insufficiency 03/10/2018  . Acute postoperative pain 01/16/2018  . Cervicogenic headache (WC) 10/30/2017  . GERD (gastroesophageal reflux disease) 09/20/2017  . Abnormal MRI, cervical spine (04/16/2017) 07/02/2017  . Weakness of left leg 06/27/2017  . Depression 04/04/2017  . Disturbance of skin sensation (Left side of face) 02/13/2017  . Muscle spasms of lower extremity 09/19/2016  . Chronic hip pain (Bilateral) 02/01/2016  . Chronic shoulder pain (Secondary Area of Pain) (Bilateral) (L>R) 02/01/2016  . Abnormal UDS (12/01/2015) 12/12/2015  . Work related injury (DOI: 07/20/2010) 12/05/2015  . Neurogenic pain 12/05/2015  . Neuropathic pain 12/05/2015  . Healthcare maintenance 12/01/2015  . 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 Chronic neck pain (WC) (Secondary Area of Pain) (Bilateral) (L>R) 12/01/2015  . Musculoskeletal pain 12/01/2015  . Muscle spasms of neck 12/01/2015  . History of panic attacks 10/18/2015  . Encounter for therapeutic drug level monitoring 10/18/2015  . Benzodiazepine dependence ([place]) 10/18/2015  . Chronic pain syndrome (WC) 10/18/2015  . Chronic upper back pain (WC) 10/18/2015  . Thoracic back pain (WC) 10/18/2015  . Hand pain 10/18/2015  . H/O cervical spine surgery 10/18/2015  . Chronic knee pain (Bilateral) 10/18/2015  . Chronic low back pain 10/18/2015  . Cervical spondylosis (WC) 10/18/2015  . Generalized anxiety disorder 10/18/2015  . Cervical facet syndrome (WC) (Bilateral) (L>R) 10/18/2015  . Diffuse arthralgia 10/18/2015  . Tobacco use disorder 10/18/2015  . Fibromyalgia 10/18/2015  .  History of anterior cervical discectomy and fusion (ACDF) (left side)  (WC) 10/18/2015  . Cervical radicular pain (WC) (Primary Area of Pain) (Left) 10/18/2015    Celyn Nilda Simmer PT, MPH  09/26/2020, 4:04  PM  [facility] [place], [place], [address] Phone: [phone]   Fax:  [phone]  Name: SELINE ENZOR MRN: [id number] Date of Birth: June 09, 1961  PHYSICAL THERAPY DISCHARGE SUMMARY  Visits from Start of Care: 3  Current functional level related to goals / functional outcomes: See last progress note for discharge status    Remaining deficits: Unknown  Education / Equipment: HEP   Plan: Patient agrees to discharge.  Patient goals were partially met. Patient is being discharged due to not returning since the last visit.  ?????    Celyn P. Helene Kelp PT, MPH 02/16/21 1:42 PM

## 2020-09-28 ENCOUNTER — Other Ambulatory Visit: Payer: Self-pay

## 2020-09-28 ENCOUNTER — Encounter: Payer: Self-pay | Admitting: Sports Medicine

## 2020-09-28 ENCOUNTER — Ambulatory Visit (INDEPENDENT_AMBULATORY_CARE_PROVIDER_SITE_OTHER): Payer: Self-pay | Admitting: Sports Medicine

## 2020-09-28 DIAGNOSIS — M4316 Spondylolisthesis, lumbar region: Secondary | ICD-10-CM

## 2020-09-28 NOTE — Assessment & Plan Note (Signed)
Teresa [name] returns, she continues to have pain in spite of physical therapy, and medications including narcotics, Cymbalta, she has not yet been on gabapentin for this. We obtained an MRI after failure of therapy, the results are dictated above. At this point I think she is a candidate for multilevel facet joint injections followed by medial branch blocks and ablation if no better. It does sound as though she had cervical facet RFA in the distant past at a facility and [facility]. Return to see me 1 month after her facet joint injections to evaluate relief.

## 2020-09-28 NOTE — Progress Notes (Signed)
    Procedures performed today:    None.  Independent interpretation of notes and tests performed by another provider:   Lumbar spine MRI personally reviewed, dominant finding is widespread L3-S1 bilateral facet joint osteoarthritis, she also has grade 1 spondylolisthesis of L5 on S1.  Brief History, Exam, Impression, and Recommendations:    Spondylolisthesis at L4-L5 level Teresa [name] returns, she continues to have pain in spite of physical therapy, and medications including narcotics, Cymbalta, she has not yet been on gabapentin for this. We obtained an MRI after failure of therapy, the results are dictated above. At this point I think she is a candidate for multilevel facet joint injections followed by medial branch blocks and ablation if no better. It does sound as though she had cervical facet RFA in the distant past at a facility and [facility]. Return to see me 1 month after her facet joint injections to evaluate relief.    ___________________________________________ [name]. Benjamin Stain, M.D., ABFM., CAQSM. Primary Care and Sports Medicine [facility]  Adjunct Instructor of Family Medicine  [employer]

## 2020-09-30 ENCOUNTER — Ambulatory Visit: Payer: 59 | Admitting: Physical Therapy

## 2020-10-05 ENCOUNTER — Encounter: Payer: 59 | Admitting: Rehabilitative and Restorative Service Providers"

## 2020-10-07 ENCOUNTER — Ambulatory Visit: Payer: 59 | Admitting: Physical Therapy

## 2020-10-26 ENCOUNTER — Ambulatory Visit: Payer: 59 | Admitting: Sports Medicine

## 2021-01-08 ENCOUNTER — Other Ambulatory Visit: Payer: Self-pay

## 2021-01-08 DIAGNOSIS — Z20822 Contact with and (suspected) exposure to covid-19: Secondary | ICD-10-CM

## 2021-01-09 ENCOUNTER — Encounter: Payer: Self-pay | Admitting: Rehabilitative and Restorative Service Providers"

## 2021-01-10 LAB — SARS-COV-2, NAA 2 DAY TAT

## 2021-01-10 LAB — NOVEL CORONAVIRUS, NAA: SARS-CoV-2, NAA: NOT DETECTED

## 2021-02-13 IMAGING — MR MR LUMBAR SPINE W/O CM
4 of 5 series · 25 of 48 positions shown · non-contrast
Comparison: Prior radiograph from 06/28/2020.

CLINICAL DATA: Initial evaluation for spondylolisthesis with right
L4 radiculitis.

EXAM:
MRI LUMBAR SPINE WITHOUT CONTRAST
TECHNIQUE: Multiplanar, multisequence MR imaging of the lumbar spine was
performed. No intravenous contrast was administered.

[Series 6: T2 · sagittal · 4.0mm · 0.81mm/px · 6 of 15 slices shown (1 of 2)]
[im 1/15]
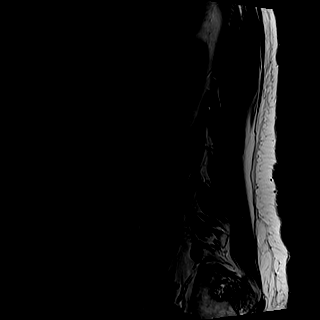
[im 3/15]
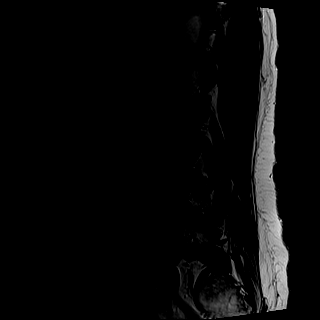
[im 6/15]
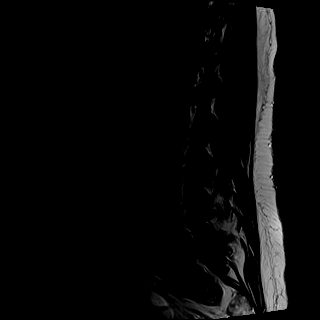
[im 9/15]
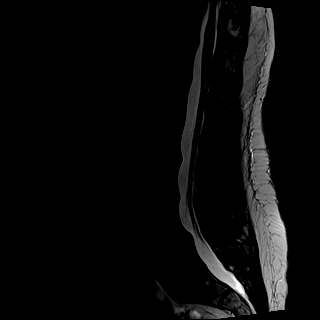
[im 12/15]
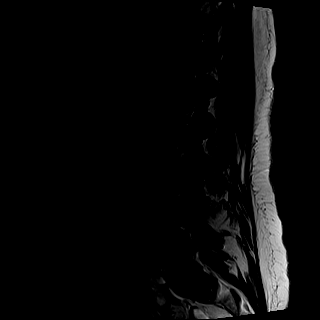
[im 15/15]
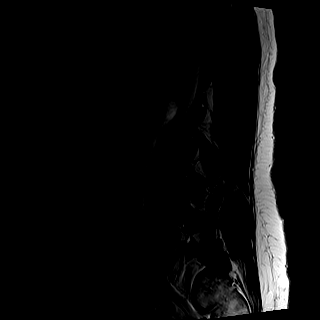

[Series 7: T1 · sagittal · 4.0mm · 0.41mm/px · 6 of 15 slices shown (1 of 2)]
[im 1/15]
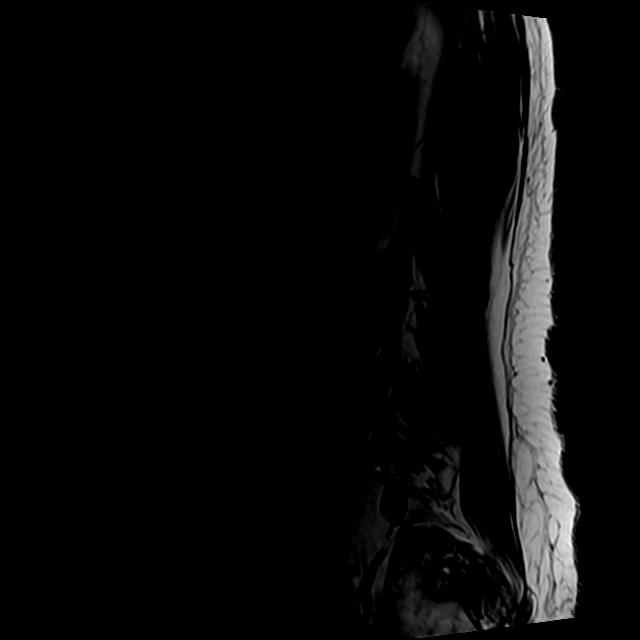
[im 3/15]
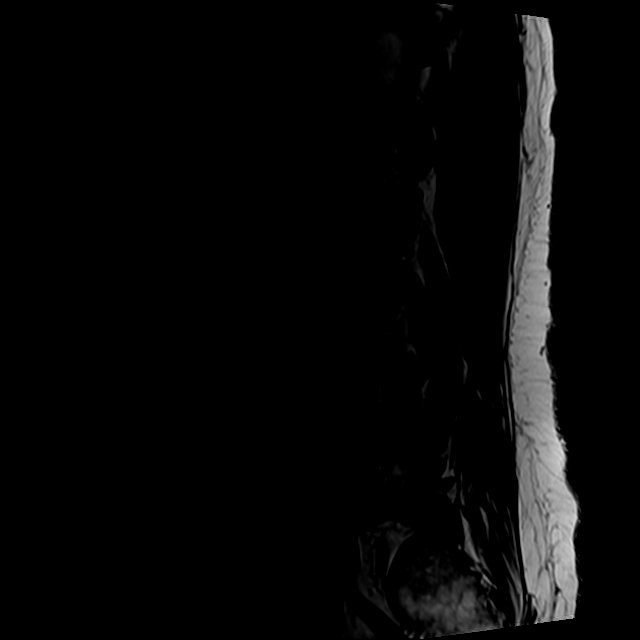
[im 6/15]
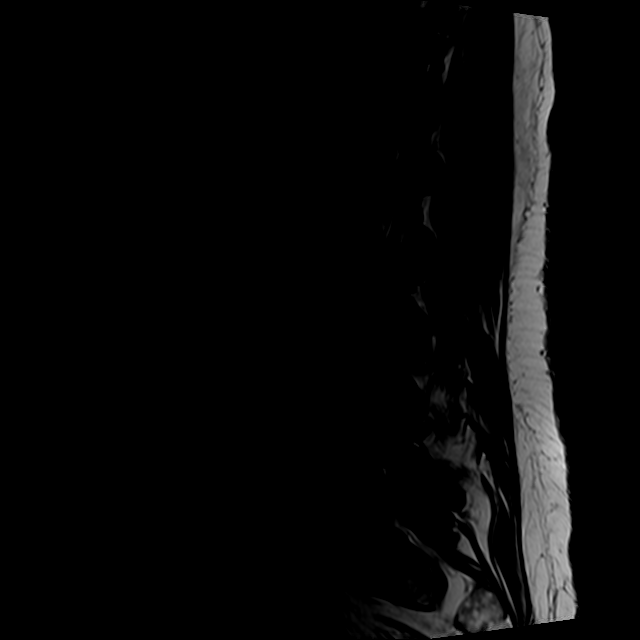
[im 9/15]
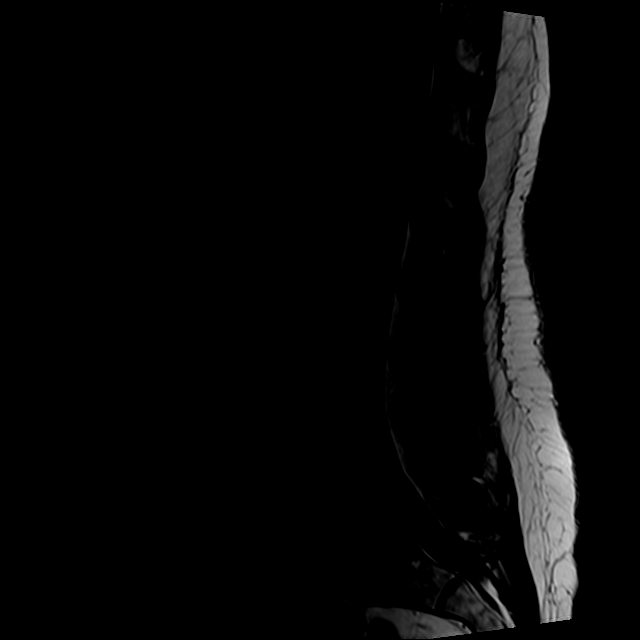
[im 12/15]
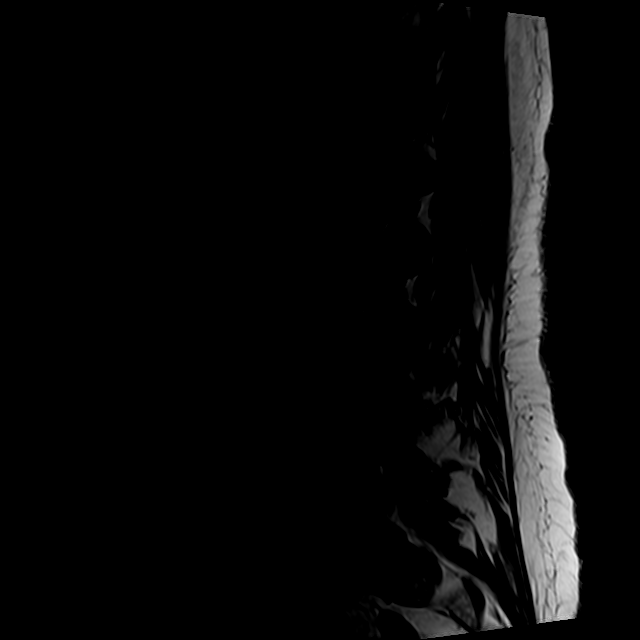
[im 15/15]
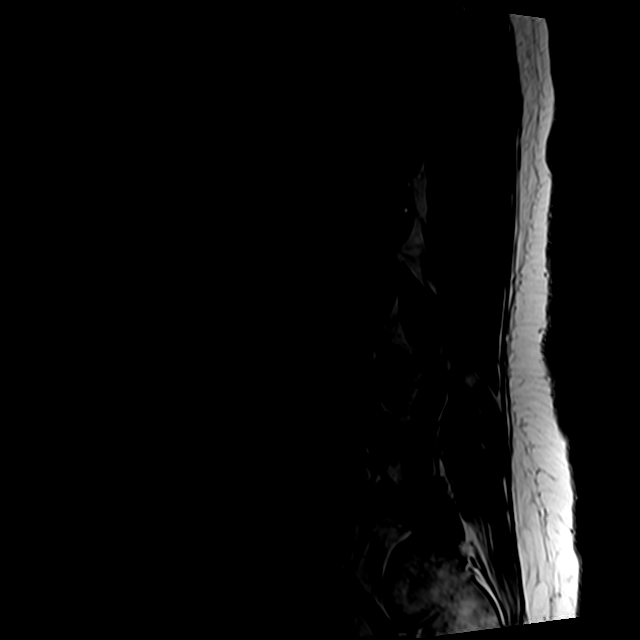

[Series 9: T2 · axial · 4.0mm · 0.78mm/px · z∈[-99,+130]mm · 9 of 39 slices shown (2 of 2)]
[im 1/39]
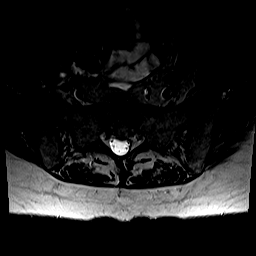
[im 6/39]
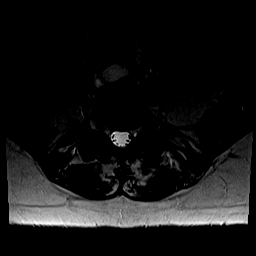
[im 11/39]
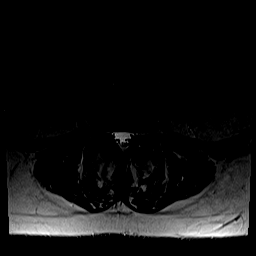
[im 17/39]
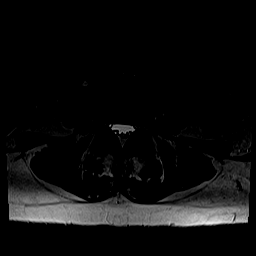
[im 20/39]
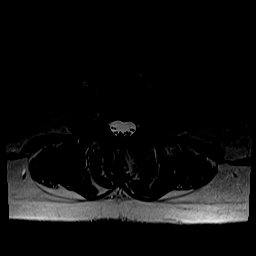
[im 22/39]
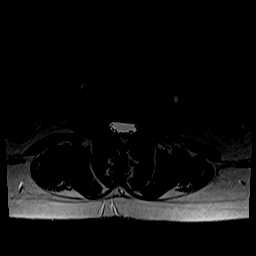
[im 28/39]
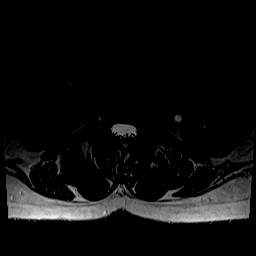
[im 33/39]
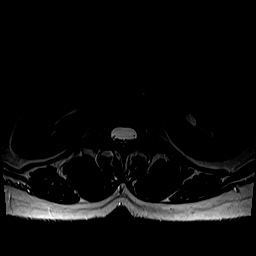
[im 39/39]
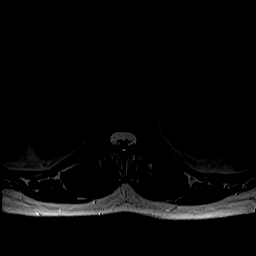

[Series 10: T1 · axial · 4.0mm · 0.39mm/px · z∈[-99,+100]mm · 4 of 39 slices shown (2 of 2)]
[im 1/39]
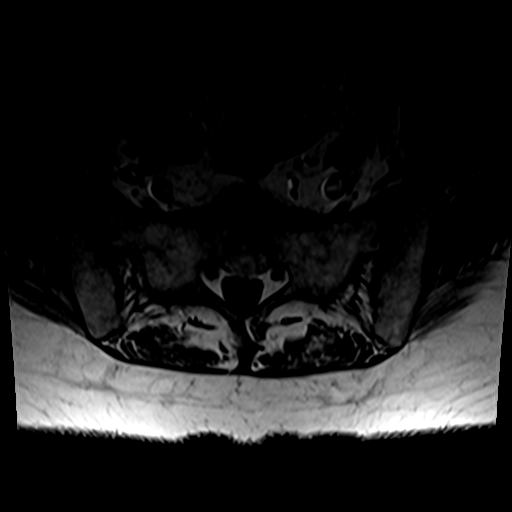
[im 6/39]
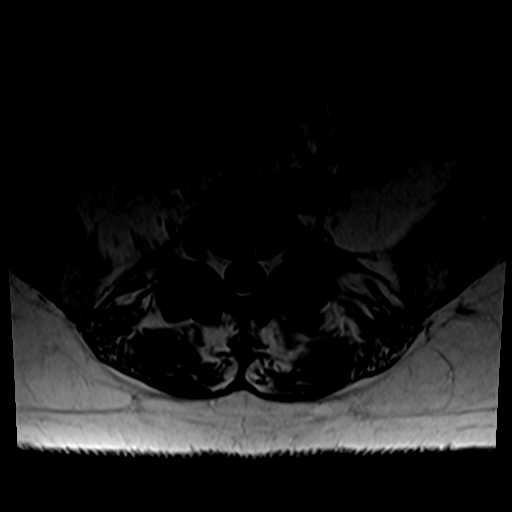
[im 20/39]
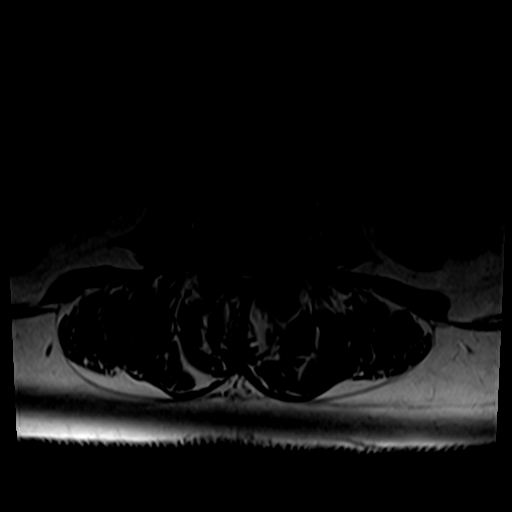
[im 33/39]
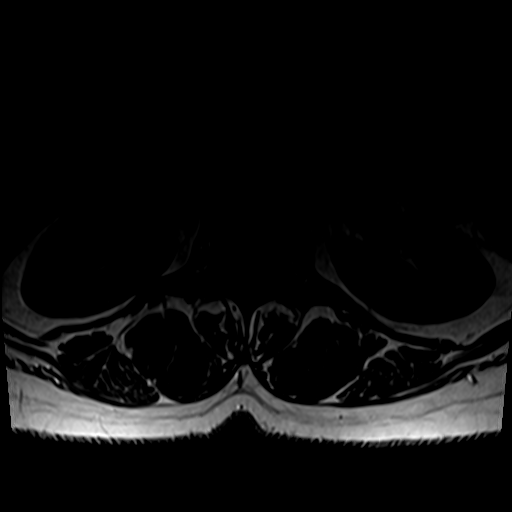

[25 of 48 positions shown; findings below may reference images not displayed]

FINDINGS: Segmentation:  Standard.

Alignment: 4 mm anterolisthesis of L4 on L5, chronic and facet
mediated. No visible pars defect. Associated trace retrolisthesis of
L2 on L3 and L3 on L4.

Vertebrae: Vertebral body height maintained without acute or chronic
fracture. Bone marrow signal intensity mildly heterogeneous but
within normal limits. No discrete or worrisome osseous lesions. No
abnormal marrow edema.

Conus medullaris and cauda equina: Conus extends to the T12 level.
Conus and cauda equina appear normal.

Paraspinal and other soft tissues: Unremarkable.

Disc levels:

L1-2:  Unremarkable.

L2-3: Diffuse disc bulge with disc desiccation and intervertebral
disc space narrowing. Mild facet hypertrophy. No spinal stenosis.
Foramina remain patent.

L3-4: Mild disc bulge with disc desiccation. Superimposed shallow
right extraforaminal disc protrusion, closely approximating and
potentially irritating the exiting right L3 nerve root (series 9,
image 23). Mild facet hypertrophy. No spinal stenosis. Mild
bilateral L3 foraminal narrowing.

L4-5: 4 mm anterolisthesis with associated advanced bilateral facet
hypertrophy. Associated broad posterior pseudo disc
bulge/uncovering. Resultant mild canal with right greater than left
lateral recess stenosis. Mild bilateral L4 foraminal narrowing.

L5-S1: Disc desiccation without significant disc bulge. Moderate
facet hypertrophy. No canal or foraminal stenosis.
IMPRESSION: 1. 4 mm anterolisthesis of L4 on L5 with associated advanced facet
arthropathy, resulting in mild canal with right greater than left
lateral recess stenosis, with mild bilateral L4 foraminal narrowing.
2. Shallow right extraforaminal disc protrusion at L3-4, closely
approximating and potentially irritating the exiting right L3 nerve
root.
3. Mild noncompressive disc bulging at L2-3 without stenosis or
impingement.

## 2021-04-12 ENCOUNTER — Ambulatory Visit: Payer: Self-pay | Admitting: Sports Medicine

## 2021-05-17 ENCOUNTER — Ambulatory Visit (INDEPENDENT_AMBULATORY_CARE_PROVIDER_SITE_OTHER): Payer: Self-pay | Admitting: Sports Medicine

## 2021-05-17 ENCOUNTER — Ambulatory Visit (INDEPENDENT_AMBULATORY_CARE_PROVIDER_SITE_OTHER): Payer: Self-pay

## 2021-05-17 ENCOUNTER — Other Ambulatory Visit: Payer: Self-pay

## 2021-05-17 DIAGNOSIS — M25521 Pain in right elbow: Secondary | ICD-10-CM

## 2021-05-17 DIAGNOSIS — G8929 Other chronic pain: Secondary | ICD-10-CM

## 2021-05-17 DIAGNOSIS — M25551 Pain in right hip: Secondary | ICD-10-CM

## 2021-05-17 DIAGNOSIS — M4316 Spondylolisthesis, lumbar region: Secondary | ICD-10-CM

## 2021-05-17 NOTE — Assessment & Plan Note (Signed)
Has done well after L4-S1 bilateral facet joint radiofrequency ablation, still has some minimal pain remaining. She does have a right L3-L4 herniated disc, we could certainly target this with an epidural with Dr. Laurian Brim.

## 2021-05-17 NOTE — Assessment & Plan Note (Signed)
Did well after an injection about a year ago for tennis elbow. Now with recurrence of discomfort, holding off on injection today, I like to see her back in a month after her hip injection, we will have her do some home rehab and if still having pain we will do a common extensor tendon injection.

## 2021-05-17 NOTE — Assessment & Plan Note (Addendum)
Chronic right hip pain, localized both into the groin and greater trochanter, today the majority of her pain is lateral, she has tried topical modalities, hydrocodone, no improvement. Dedicated hip x-rays. Greater trochanteric bursa injection today, return to see me in a month.

## 2021-05-17 NOTE — Progress Notes (Signed)
    Procedures performed today:    Procedure: Real-time Ultrasound Guided injection of the right greater trochanteric bursa Device: Samsung HS60  Verbal informed consent obtained.  Time-out conducted.  Noted no overlying erythema, induration, or other signs of local infection.  Skin prepped in a sterile fashion.  Local anesthesia: Topical Ethyl chloride.  With sterile technique and under real time ultrasound guidance:  Normal-appearing bursa, 1 cc Kenalog 40, 2 cc lidocaine, 2 cc bupivacaine injected easily Completed without difficulty  Advised to call if fevers/chills, erythema, induration, drainage, or persistent bleeding.  Images permanently stored and available for review in PACS.  Impression: Technically successful ultrasound guided injection.  Independent interpretation of notes and tests performed by another provider:   None.  Brief History, Exam, Impression, and Recommendations:    Chronic right hip pain Chronic right hip pain, localized both into the groin and greater trochanter, today the majority of her pain is lateral, she has tried topical modalities, hydrocodone, no improvement. Dedicated hip x-rays. Greater trochanteric bursa injection today, return to see me in a month.   Spondylolisthesis at L4-L5 level Has done well after L4-S1 bilateral facet joint radiofrequency ablation, still has some minimal pain remaining. She does have a right L3-L4 herniated disc, we could certainly target this with an epidural with Dr. Laurian Brim.   Right elbow pain Did well after an injection about a year ago for tennis elbow. Now with recurrence of discomfort, holding off on injection today, I like to see her back in a month after her hip injection, we will have her do some home rehab and if still having pain we will do a common extensor tendon injection.    ___________________________________________ [name]. Benjamin Stain, M.D., ABFM., CAQSM. Primary Care and Sports Medicine [facility]  Adjunct Instructor of Family Medicine  [employer]

## 2021-06-14 ENCOUNTER — Ambulatory Visit (INDEPENDENT_AMBULATORY_CARE_PROVIDER_SITE_OTHER): Payer: Self-pay | Admitting: Sports Medicine

## 2021-06-14 ENCOUNTER — Ambulatory Visit (INDEPENDENT_AMBULATORY_CARE_PROVIDER_SITE_OTHER): Payer: Self-pay

## 2021-06-14 ENCOUNTER — Other Ambulatory Visit: Payer: Self-pay

## 2021-06-14 DIAGNOSIS — M25551 Pain in right hip: Secondary | ICD-10-CM

## 2021-06-14 DIAGNOSIS — R1030 Lower abdominal pain, unspecified: Secondary | ICD-10-CM

## 2021-06-14 DIAGNOSIS — M25521 Pain in right elbow: Secondary | ICD-10-CM

## 2021-06-14 DIAGNOSIS — G8929 Other chronic pain: Secondary | ICD-10-CM

## 2021-06-14 NOTE — Assessment & Plan Note (Signed)
Did well after an injection over a year ago for tennis elbow, increasing discomfort, we did put her through home rehab without much improvement, right common extensor tendon injection today. Return to see me in a month.

## 2021-06-14 NOTE — Progress Notes (Signed)
    Procedures performed today:    Procedure: Real-time Ultrasound Guided injection of the right common extensor tendon origin Device: Samsung HS60  Verbal informed consent obtained.  Time-out conducted.  Noted no overlying erythema, induration, or other signs of local infection.  Skin prepped in a sterile fashion.  Local anesthesia: Topical Ethyl chloride.  With sterile technique and under real time ultrasound guidance: Only minimal common extensor tendon tearing at the insertion, 1 cc kenalog 40, 1 cc lidocaine, 1 cc bupivacaine injected easily both superficial to and deep to the extensor tendon. Completed without difficulty  Advised to call if fevers/chills, erythema, induration, drainage, or persistent bleeding.  Images permanently stored and available for review in PACS.  Impression: Technically successful ultrasound guided injection.  Independent interpretation of notes and tests performed by another provider:   None.  Brief History, Exam, Impression, and Recommendations:    Right elbow pain Did well after an injection over a year ago for tennis elbow, increasing discomfort, we did put her through home rehab without much improvement, right common extensor tendon injection today. Return to see me in a month.  Chronic right hip pain Meshawn does have chronic right hip pain, she localizes it predominantly at the greater trochanter, I injected her trochanteric bursa at the last visit and it has improved some, she still extremely weak with regards to her hip abductor's and she has had a few falls, ultimately I think she needs aggressive strengthening as she has become very weak with regards to her hip girdle. Adding formal PT. Return to see me in 6 weeks for this.  Of note she never got the hip x-rays that I ordered at the last visit.    ___________________________________________ [name]. Benjamin Stain, M.D., ABFM., CAQSM. Primary Care and Sports Medicine [facility]  [facility]  Adjunct Instructor of Family Medicine  [employer]

## 2021-06-14 NOTE — Assessment & Plan Note (Signed)
Teresa [name] does have chronic right hip pain, she localizes it predominantly at the greater trochanter, I injected her trochanteric bursa at the last visit and it has improved some, she still extremely weak with regards to her hip abductor's and she has had a few falls, ultimately I think she needs aggressive strengthening as she has become very weak with regards to her hip girdle. Adding formal PT. Return to see me in 6 weeks for this.  Of note she never got the hip x-rays that I ordered at the last visit.

## 2021-07-19 ENCOUNTER — Ambulatory Visit: Payer: Self-pay | Admitting: Sports Medicine

## 2021-08-07 ENCOUNTER — Other Ambulatory Visit: Payer: Self-pay

## 2021-08-07 ENCOUNTER — Ambulatory Visit
Admission: EM | Admit: 2021-08-07 | Discharge: 2021-08-07 | Disposition: A | Discharge status: Home / Self Care | Payer: Self-pay | Attending: Family Medicine | Admitting: Family Medicine

## 2021-08-07 DIAGNOSIS — J3489 Other specified disorders of nose and nasal sinuses: Secondary | ICD-10-CM

## 2021-08-07 MED ORDER — MUPIROCIN CALCIUM 2 % NA OINT: 1.0000 "application " | TOPICAL_OINTMENT | Freq: Two times a day (BID) | NASAL | 0 refills | Status: DC

## 2021-08-07 MED ORDER — DOXYCYCLINE HYCLATE 100 MG PO CAPS: 100.0000 mg | ORAL_CAPSULE | Freq: Two times a day (BID) | ORAL | 0 refills | Status: DC

## 2021-08-07 NOTE — Discharge Instructions (Addendum)
Medication as prescribed.  If persists, please let us know.  Take care  Dr. Adriana Simas

## 2021-08-07 NOTE — ED Triage Notes (Signed)
Pt c/o right sided nostril pain for several days. Pt thinks there may be an infected hair follicle. Pt has taken some amoxicillin 500mg  she had left over from another issue. She reports some improvement. Pt is concerned she may have MRSA.

## 2021-08-08 NOTE — ED Provider Notes (Signed)
[facility]    CSN: [id number] Arrival date & time: 08/07/21  1916      History   Chief Complaint Chief Complaint  Patient presents with   nostril pain    right    HPI 60 year old female presents with the above complaint.  Patient reports that for the past 5 days she has had right sided nostril pain. She reports swelling and redness of the tip of the nose. She is concerned that she has an infection. No fever. She has take some left over amoxicillin with some improvement. No known inciting factor. No other associated symptoms. No other complaints.   Past Medical History:  Diagnosis Date    History of anterior cervical discectomy and fusion (ACDF) (left side). 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) 03/24/2018   DDD (degenerative disc disease), cervical 03/24/2018   Spondylosis without myelopathy or radiculopathy, cervical region 03/10/2018   Vitamin D insufficiency 03/10/2018   Acute postoperative pain 01/16/2018   Cervicogenic headache (WC) 10/30/2017   GERD (gastroesophageal reflux disease) 09/20/2017   Abnormal MRI, cervical spine (04/16/2017) 07/02/2017   Weakness of left leg 06/27/2017   Depression 04/04/2017   Disturbance of skin sensation (Left side of face) 02/13/2017   Muscle spasms of lower extremity 09/19/2016   Chronic shoulder pain (Secondary  Area of Pain) (Bilateral) (L>R) 02/01/2016   Abnormal UDS (12/01/2015) 12/12/2015   Work related injury (DOI: 07/20/2010) 12/05/2015   Neurogenic pain 12/05/2015   Neuropathic pain 12/05/2015   Healthcare maintenance 12/01/2015   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/2015   Chronic neck pain (WC) (Secondary Area of Pain) (Bilateral) (L>R) 12/01/2015   Musculoskeletal pain 12/01/2015   Muscle spasms of neck 12/01/2015   History of panic attacks 10/18/2015   Encounter for therapeutic drug level monitoring 10/18/2015   Benzodiazepine dependence (HCC) 10/18/2015   Chronic pain syndrome (WC) 10/18/2015   Chronic upper back pain (WC) 10/18/2015   Thoracic back pain (WC) 10/18/2015   Hand pain 10/18/2015   H/O cervical spine surgery 10/18/2015   Chronic knee pain (Bilateral) 10/18/2015   Chronic low back pain 10/18/2015   Cervical spondylosis (WC) 10/18/2015   Generalized anxiety disorder 10/18/2015   Cervical facet syndrome (WC) (Bilateral) (L>R) 10/18/2015   Diffuse arthralgia 10/18/2015   Tobacco use disorder 10/18/2015   Fibromyalgia 10/18/2015    History of anterior cervical discectomy and fusion (ACDF) (left side)  (WC) 10/18/2015   Cervical radicular pain (WC) (Primary Area of Pain) (Left) 10/18/2015    Past Surgical History:  Procedure Laterality Date   btl     NECK SURGERY     TUBAL LIGATION Bilateral     OB History   No obstetric history on file.      Home Medications    Prior  to Admission medications   Medication Sig Start Date End Date Taking? Authorizing Provider  ALPRAZolam Prudy Feeler) 0.5 MG tablet Take 0.5 mg by mouth at bedtime as needed for anxiety.   Yes [provider]  doxycycline (VIBRAMYCIN) 100 MG capsule Take 1 capsule (100 mg total) by mouth 2 (two) times daily. 08/07/21  Yes Tae Robak G, DO  DULoxetine (CYMBALTA) 60 MG capsule TAKE ONE CAPSULE BY MOUTH EVERY DAY 11/05/19  Yes McGowan, Carollee Herter A, PA-C   HYDROcodone-acetaminophen (NORCO/VICODIN) 5-325 MG tablet Take 1 tablet by mouth every 6 (six) hours as needed. for pain 03/23/19  Yes [provider]  mupirocin nasal ointment (BACTROBAN) 2 % Place 1 application into the nose 2 (two) times daily for 7 days. 08/07/21 08/14/21 Yes Tommie Sams, DO    Family History Family History  Problem Relation Age of Onset   Healthy Mother    Healthy Father     Social History Social History   Tobacco Use   Smoking status: Every Day    Packs/day: 0.20    Types: Cigarettes   Smokeless tobacco: Never   Tobacco comments:    just restarted smoking  Vaping Use   Vaping Use: Never used  Substance Use Topics   Alcohol use: No    Alcohol/week: 0.0 standard drinks   Drug use: No     Allergies   Eggs or egg-derived products   Review of Systems Review of Systems Per HPI  Physical Exam Triage Vital Signs ED Triage Vitals  Enc Vitals Group     BP 08/07/21 1928 124/85     Pulse Rate 08/07/21 1928 86     Resp 08/07/21 1928 18     Temp 08/07/21 1928 98.1 F (36.7 C)     Temp Source 08/07/21 1928 Oral     SpO2 08/07/21 1928 98 %     Weight 08/07/21 1925 153 lb (69.4 kg)     Height 08/07/21 1925 5\' 7"  (1.702 m)     Head Circumference --      Peak Flow --      Pain Score 08/07/21 1925 1     Pain Loc --      Pain Edu? --      Excl. in GC? --    Updated Vital Signs BP 124/85 (BP Location: Left Arm)   Pulse 86   Temp 98.1 F (36.7 C) (Oral)   Resp 18   Ht 5\' 7"  (1.702 m)   Wt 69.4 kg   LMP 09/05/2014 (Within Years)   SpO2 98%   BMI 23.96 kg/m   Visual Acuity Right Eye Distance:   Left Eye Distance:   Bilateral Distance:    Right Eye Near:   Left Eye Near:    Bilateral Near:     Physical Exam Vitals and nursing note reviewed.  Constitutional:      General: She is not in acute distress.    Appearance: Normal appearance. She is not ill-appearing.  HENT:     Head: Normocephalic and atraumatic.     Nose:      Comments: Nose - erythema of the tip of the nose. Small area of purulence noted in the right nostril.  Eyes:     General:        Right eye: No discharge.        Left eye: No discharge.     Conjunctiva/sclera: Conjunctivae normal.  Pulmonary:     Effort: Pulmonary effort is normal. No respiratory distress.  Neurological:     Mental Status: She is alert.     UC Treatments / Results  Labs (all labs ordered are listed, but only abnormal results are displayed) Labs Reviewed - No data to display  EKG   Radiology No results found.  Procedures Procedures (including critical care time)  Medications Ordered in UC Medications - No data to display  Initial Impression / Assessment and Plan / UC Course  I have reviewed the triage vital signs and the nursing notes.  Pertinent labs & imaging results that were available during my care of the patient were reviewed by me and considered in my medical decision making (see chart for details).    60 year old female presents with nasal vestibulitis. Treating with bactroban and doxycycline.  Final Clinical Impressions(s) / UC Diagnoses   Final diagnoses:  Nasal vestibulitis     Discharge Instructions      Medication as prescribed.  If persists, please let us know.  Take care  Dr. Adriana Simas    ED Prescriptions     Medication Sig Dispense Auth. Provider   doxycycline (VIBRAMYCIN) 100 MG capsule Take 1 capsule (100 mg total) by mouth 2 (two) times daily. 14 capsule Trenton Passow G, DO   mupirocin nasal ointment (BACTROBAN) 2 % Place 1 application into the nose 2 (two) times daily for 7 days. 15 g Tommie Sams, DO      PDMP not reviewed this encounter.   Tommie Sams, [place] 08/08/21 2238

## 2021-12-20 ENCOUNTER — Ambulatory Visit (INDEPENDENT_AMBULATORY_CARE_PROVIDER_SITE_OTHER): Payer: Self-pay | Admitting: Sports Medicine

## 2021-12-20 ENCOUNTER — Ambulatory Visit (INDEPENDENT_AMBULATORY_CARE_PROVIDER_SITE_OTHER): Payer: Self-pay

## 2021-12-20 ENCOUNTER — Other Ambulatory Visit: Payer: Self-pay

## 2021-12-20 DIAGNOSIS — M25551 Pain in right hip: Secondary | ICD-10-CM

## 2021-12-20 DIAGNOSIS — G8929 Other chronic pain: Secondary | ICD-10-CM

## 2021-12-20 NOTE — Progress Notes (Signed)
° ° °  Procedures performed today:    Procedure: Real-time Ultrasound Guided injection of the right greater trochanteric bursa Device: Samsung HS60  Verbal informed consent obtained.  Time-out conducted.  Noted no overlying erythema, induration, or other signs of local infection.  Skin prepped in a sterile fashion.  Local anesthesia: Topical Ethyl chloride.  With sterile technique and under real time ultrasound guidance: Noted normal-appearing bursa, 1 cc Kenalog 40, 2 cc lidocaine, 2 cc bupivacaine injected easily Completed without difficulty  Advised to call if fevers/chills, erythema, induration, drainage, or persistent bleeding.  Images permanently stored and available for review in PACS.  Impression: Technically successful ultrasound guided injection.  Independent interpretation of notes and tests performed by another provider:   None.  Brief History, Exam, Impression, and Recommendations:    Chronic right hip pain Chronic right hip pain, today localizing at the greater trochanter, this was last injected in May 2022, we did recommend some formal physical therapy and aggressive hip girdle strengthening, does not sound like she did any physical therapy so this will likely continue to recur. Repeat injection today, return as needed. I did again stressed the importance of aggressive hip girdle strengthening, hip abductor strengthening for trochanteric bursitis, she has agreed to do this at [facility].  Chronic process with exacerbation and pharmacologic intervention  ___________________________________________ [name]. Benjamin Stain, M.D., ABFM., CAQSM. Primary Care and Sports Medicine [facility]  Adjunct Instructor of Family Medicine  [employer]

## 2021-12-20 NOTE — Assessment & Plan Note (Addendum)
Chronic right hip pain, today localizing at the greater trochanter, this was last injected in May 2022, we did recommend some formal physical therapy and aggressive hip girdle strengthening, does not sound like she did any physical therapy so this will likely continue to recur. Repeat injection today, return as needed. I did again stressed the importance of aggressive hip girdle strengthening, hip abductor strengthening for trochanteric bursitis, she has agreed to do this at [facility].

## 2022-01-31 ENCOUNTER — Ambulatory Visit: Payer: Self-pay | Admitting: Sports Medicine

## 2022-10-04 ENCOUNTER — Ambulatory Visit: Payer: Self-pay | Admitting: Sports Medicine

## 2023-01-17 ENCOUNTER — Ambulatory Visit (INDEPENDENT_AMBULATORY_CARE_PROVIDER_SITE_OTHER): Payer: Medicaid Other

## 2023-01-17 ENCOUNTER — Ambulatory Visit (INDEPENDENT_AMBULATORY_CARE_PROVIDER_SITE_OTHER): Payer: Medicaid Other | Admitting: Sports Medicine

## 2023-01-17 DIAGNOSIS — G8929 Other chronic pain: Secondary | ICD-10-CM | POA: Diagnosis not present

## 2023-01-17 DIAGNOSIS — M25551 Pain in right hip: Secondary | ICD-10-CM | POA: Diagnosis not present

## 2023-01-17 NOTE — Progress Notes (Signed)
    Procedures performed today:    Procedure: Real-time Ultrasound Guided injection of the right greater trochanteric bursa Device: Samsung HS60  Verbal informed consent obtained.  Time-out conducted.  Noted no overlying erythema, induration, or other signs of local infection.  Skin prepped in a sterile fashion.  Local anesthesia: Topical Ethyl chloride.  With sterile technique and under real time ultrasound guidance: Noted minimal bursitis, 1 cc Kenalog 40, 2 cc lidocaine, 2 cc bupivacaine injected easily Completed without difficulty  Advised to call if fevers/chills, erythema, induration, drainage, or persistent bleeding.  Images permanently stored and available for review in PACS.  Impression: Technically successful ultrasound guided injection.  Independent interpretation of notes and tests performed by another provider:   None.  Brief History, Exam, Impression, and Recommendations:    Chronic right hip pain Pleasant 63 year old female, chronic right hip pain localized to the greater trochanter, last injected December 2022. Now having recurrence of pain right greater trochanter. Exam is consistent, no loss of internal rotation. Has not done physical therapy, she will let me know if she is interested though I did stress the importance today. I injected her right greater trochanteric bursa today, return to see me as needed.    ____________________________________________ Gwen Her. Dianah Field, M.D., ABFM., CAQSM., AME. Primary Care and Sports Medicine [facility]  Adjunct Professor of [employer]  Risk manager

## 2023-01-17 NOTE — Assessment & Plan Note (Signed)
Pleasant 62 year old female, chronic right hip pain localized to the greater trochanter, last injected December 2022. Now having recurrence of pain right greater trochanter. Exam is consistent, no loss of internal rotation. Has not done physical therapy, she will let me know if she is interested though I did stress the importance today. I injected her right greater trochanteric bursa today, return to see me as needed.

## 2023-01-21 ENCOUNTER — Encounter: Payer: Self-pay | Admitting: Sports Medicine

## 2023-07-11 ENCOUNTER — Other Ambulatory Visit (INDEPENDENT_AMBULATORY_CARE_PROVIDER_SITE_OTHER): Payer: Medicaid Other

## 2023-07-11 ENCOUNTER — Ambulatory Visit: Payer: Medicaid Other | Admitting: Sports Medicine

## 2023-07-11 DIAGNOSIS — G8929 Other chronic pain: Secondary | ICD-10-CM

## 2023-07-11 DIAGNOSIS — M25551 Pain in right hip: Secondary | ICD-10-CM

## 2023-07-11 NOTE — Assessment & Plan Note (Signed)
Pleasant 62 year old female, she has chronic right hip pain historically localized to the greater trochanter, today more recently localized more anteriorly. 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 Last injection was in January 2024, trochanteric bursa, she is having recurrence of pain. Today she does have some pain with internal rotation and she has a positive FADIR sign, she also has some tenderness over the greater trochanter but this is not quite concordant. Today I opted to inject her hip joint. She reported much better immediate pain relief, I will see her back in 6 weeks as needed, and should be very cognizant as to whether the pain seems to be coming more from the anterior hip or the lateral hip.

## 2023-07-11 NOTE — Progress Notes (Signed)
    Procedures performed today:    Procedure: Real-time Ultrasound Guided injection of the right hip joint Device: Samsung HS60  Verbal informed consent obtained.  Time-out conducted.  Noted no overlying erythema, induration, or other signs of local infection.  Skin prepped in a sterile fashion.  Local anesthesia: Topical Ethyl chloride.  With sterile technique and under real time ultrasound guidance: Noted femoral head and neck arthrosis, 1 cc Kenalog 40, 2 cc lidocaine, 2 cc bupivacaine injected easily Completed without difficulty  Advised to call if fevers/chills, erythema, induration, drainage, or persistent bleeding.  Images permanently stored and available for review in PACS.  Impression: Technically successful ultrasound guided injection.  Independent interpretation of notes and tests performed by another provider:   None.  Brief History, Exam, Impression, and Recommendations:    Chronic right hip pain Pleasant 62 year old female, she has chronic right hip pain historically localized to the greater trochanter, today more recently localized more anteriorly. 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 Last injection was in January 2024, trochanteric bursa, she is having recurrence of pain. Today she does have some pain with internal rotation and she has a positive FADIR sign, she also has some tenderness over the greater trochanter but this is not quite concordant. Today I opted to inject her hip joint. She reported much better immediate pain relief, I will see her back in 6 weeks as needed, and should be very cognizant as to whether the pain seems to be coming more from the anterior hip or the lateral hip.    ____________________________________________ [name]. Benjamin Stain, M.D., ABFM., CAQSM., AME. Primary Care and Sports Medicine [facility]  Adjunct Professor of Family Medicine  [employer]  [place]

## 2023-10-07 ENCOUNTER — Ambulatory Visit: Payer: Medicaid Other | Admitting: Sports Medicine

## 2023-10-18 ENCOUNTER — Ambulatory Visit (INDEPENDENT_AMBULATORY_CARE_PROVIDER_SITE_OTHER): Payer: Medicaid Other | Admitting: Sports Medicine

## 2023-10-18 ENCOUNTER — Encounter: Payer: Self-pay | Admitting: Sports Medicine

## 2023-10-18 ENCOUNTER — Other Ambulatory Visit (INDEPENDENT_AMBULATORY_CARE_PROVIDER_SITE_OTHER): Payer: Medicaid Other

## 2023-10-18 DIAGNOSIS — M25551 Pain in right hip: Secondary | ICD-10-CM

## 2023-10-18 DIAGNOSIS — G8929 Other chronic pain: Secondary | ICD-10-CM | POA: Diagnosis not present

## 2023-10-18 MED ORDER — TRIAMCINOLONE ACETONIDE 40 MG/ML IJ SUSP
40.0000 mg | Freq: Once | INTRAMUSCULAR | Status: AC
  Administered 2023-10-18: 40 mg via INTRAMUSCULAR

## 2023-10-18 NOTE — Addendum Note (Signed)
Addended by: Carren Rang A on: 10/18/2023 11:35 AM   Modules accepted: Orders

## 2023-10-18 NOTE — Progress Notes (Signed)
    Procedures performed today:    Procedure: Real-time Ultrasound Guided injection of the right greater trochanteric bursa/hip abductor's Device: Samsung HS60  Verbal informed consent obtained.  Time-out conducted.  Noted no overlying erythema, induration, or other signs of local infection.  Skin prepped in a sterile fashion.  Local anesthesia: Topical Ethyl chloride.  With sterile technique and under real time ultrasound guidance: Noted intact hip abductors, 1 cc Kenalog 40, 2 cc lidocaine, 2 cc bupivacaine injected easily Completed without difficulty  Advised to call if fevers/chills, erythema, induration, drainage, or persistent bleeding.  Images permanently stored and available for review in PACS.  Impression: Technically successful ultrasound guided injection.  Independent interpretation of notes and tests performed by another provider:   None.  Brief History, Exam, Impression, and Recommendations:    Chronic right hip pain This is a very pleasant 62 year old female, she has chronic multifactorial right hip pain, historically localized to the greater trochanter, at the last visit in July was localized anteriorly, we did a hip joint injection, her anterior pain has resolved. Today her pain is recurrent but mostly over the greater trochanter. We did a hip abductor/trochanteric bursa injection today. 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 We will get her set up with [facility] [facility] therapy for hip abductor strengthening. She also complained of a posterior component of her pain, this is likely coming from her back and she may need Dr. Laurian Brim again for this.    ____________________________________________ [name]. Benjamin Stain, M.D., ABFM., CAQSM., AME. Primary Care and Sports Medicine [facility]  Adjunct Professor of Family Medicine  [employer] [facility] [employer]  Restaurant manager, fast food

## 2023-10-18 NOTE — Assessment & Plan Note (Signed)
This is a very pleasant 62 year old female, she has chronic multifactorial right hip pain, historically localized to the greater trochanter, at the last visit in July was localized anteriorly, we did a hip joint injection, her anterior pain has resolved. Today her pain is recurrent but mostly over the greater trochanter. We did a hip abductor/trochanteric bursa injection today. 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 We will get her set up with [place] [facility] therapy for hip abductor strengthening. She also complained of a posterior component of her pain, this is likely coming from her back and she may need Dr. Laurian Brim again for this.

## 2023-10-21 NOTE — Telephone Encounter (Signed)
Left message for a return call. Patient has been scheduled for the morning. Please keep in the basket.

## 2023-10-22 ENCOUNTER — Encounter: Payer: Self-pay | Admitting: Sports Medicine

## 2023-10-22 ENCOUNTER — Telehealth (INDEPENDENT_AMBULATORY_CARE_PROVIDER_SITE_OTHER): Payer: Medicaid Other | Admitting: Sports Medicine

## 2023-10-22 DIAGNOSIS — G8929 Other chronic pain: Secondary | ICD-10-CM

## 2023-10-22 DIAGNOSIS — M25551 Pain in right hip: Secondary | ICD-10-CM

## 2023-10-22 NOTE — Progress Notes (Signed)
Virtual Visit via WebEx/MyChart   I connected with  Teresa [name]  on 10/22/23 via WebEx/MyChart/Doximity Video and verified that I am speaking with the correct person using two identifiers.   I discussed the limitations, risks, security and privacy concerns of performing an evaluation and management service by WebEx/MyChart/Doximity Video, including the higher likelihood of inaccurate diagnosis and treatment, and the availability of in person appointments.  We also discussed the likely need of an additional face to face encounter for complete and high quality delivery of care.  I also discussed with the patient that there may be a patient responsible charge related to this service. The patient expressed understanding and wishes to proceed.  Provider location is in medical facility. Patient location is at their home, different from provider location. People involved in care of the patient during this telehealth encounter were myself, my nurse/medical assistant, and my front office/scheduling team member.  Review of Systems: No fevers, chills, night sweats, weight loss, chest pain, or shortness of breath.   Objective Findings:    General: Speaking full sentences, no audible heavy breathing.  Sounds alert and appropriately interactive.  Appears well.  Face symmetric.  Extraocular movements intact.  Pupils equal and round.  No nasal flaring or accessory muscle use visualized.  Independent interpretation of tests performed by another provider:   None.  Brief History, Exam, Impression, and Recommendations:    Chronic right hip pain Previous history: this is a very pleasant 62 year old female, she has chronic multifactorial right hip pain, historically localized to the greater trochanter, at the last visit in July was localized anteriorly, we did a hip joint injection, her anterior pain has resolved. Today her pain is recurrent but mostly over the greater trochanter. We did a hip  abductor/trochanteric bursa injection today. 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 We will get her set up with [place] [facility] therapy for hip abductor strengthening. She also complained of a posterior component of her pain, this is likely coming from her back and she may need Dr. Laurian Brim again for this.  Update 10/22/2023: Hip pain is a bit better, still having some muscle tightness that I suspect may be myofascial.  We spent extensive time filling out her [employer] paperwork today with end of restrictions in January 2025.  There was a portion for her to sign so this will be scanned in and a copy will be left at the front for her to pick up.  She has not yet started PT and she has not heard from them so I have given her the number for [facility] so she can get started ASAP.   I discussed the above assessment and treatment plan with the patient. The patient was provided an opportunity to ask questions and all were answered. The patient agreed with the plan and demonstrated an understanding of the instructions.   The patient was advised to call back or seek an in-person evaluation if the symptoms worsen or if the condition fails to improve as anticipated.   I provided 30 minutes of face to face and non-face-to-face time during this encounter date, time was needed to gather information, review chart, records, communicate/coordinate with staff remotely, as well as complete documentation.   ____________________________________________ Teresa [name]. Teresa [name], M.D., ABFM., CAQSM., AME. Primary Care and Sports Medicine [facility]  Adjunct Professor of Family Medicine  [employer]  [employer]  Restaurant manager, fast food

## 2023-10-22 NOTE — Assessment & Plan Note (Signed)
Previous history: this is a very pleasant 62 year old female, she has chronic multifactorial right hip pain, historically localized to the greater trochanter, at the last visit in July was localized anteriorly, we did a hip joint injection, her anterior pain has resolved. Today her pain is recurrent but mostly over the greater trochanter. We did a hip abductor/trochanteric bursa injection today. 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 We will get her set up with [place] [facility] therapy for hip abductor strengthening. She also complained of a posterior component of her pain, this is likely coming from her back and she may need Dr. Laurian Brim again for this.  Update 10/22/2023: Hip pain is a bit better, still having some muscle tightness that I suspect may be myofascial.  We spent extensive time filling out her [employer] paperwork today with end of restrictions in January 2025.  There was a portion for her to sign so this will be scanned in and a copy will be left at the front for her to pick up.  She has not yet started PT and she has not heard from them so I have given her the number for  regional PT so she can get started ASAP.

## 2023-10-22 NOTE — Telephone Encounter (Signed)
Patient is aware she has a MyChart video visit.

## 2023-12-04 ENCOUNTER — Encounter: Payer: Self-pay | Admitting: Sports Medicine

## 2023-12-05 ENCOUNTER — Encounter: Payer: Self-pay | Admitting: Sports Medicine

## 2023-12-05 ENCOUNTER — Ambulatory Visit (INDEPENDENT_AMBULATORY_CARE_PROVIDER_SITE_OTHER): Payer: Medicaid Other | Admitting: Sports Medicine

## 2023-12-05 DIAGNOSIS — G8929 Other chronic pain: Secondary | ICD-10-CM

## 2023-12-05 DIAGNOSIS — M25551 Pain in right hip: Secondary | ICD-10-CM | POA: Diagnosis not present

## 2023-12-05 MED ORDER — TRAMADOL HCL 50 MG PO TABS: 50.0000 mg | ORAL_TABLET | Freq: Three times a day (TID) | ORAL | 0 refills | Status: DC | PRN

## 2023-12-05 NOTE — Assessment & Plan Note (Addendum)
Pleasant 62 year old female, multifactorial right hip pain, she has had hip joint and trochanteric bursa injections, she has had some relief. We also filled out significant work restriction paperwork for her. She still has some discomfort right lateral hip and has not done any physical therapy. Hip abductors are very weak on the right side, normal on the left side. She is going to try to get into [facility], I am happy to print a written order for her. The need to focus on hip abductor strengthening. Declines NSAIDs, Tylenol, declines neuropathic agents and declines tramadol. Return to see me in about 8 weeks. I will update her work note. Hip MRI if no better.  Update: End of visit patient asked for tramadol.

## 2023-12-05 NOTE — Addendum Note (Signed)
Addended by: Monica Becton on: 12/05/2023 10:24 AM   Modules accepted: Orders

## 2023-12-05 NOTE — Progress Notes (Addendum)
    Procedures performed today:    None.  Independent interpretation of notes and tests performed by another provider:   None.  Brief History, Exam, Impression, and Recommendations:    Chronic right hip pain Pleasant 62 year old female, multifactorial right hip pain, she has had hip joint and trochanteric bursa injections, she has had some relief. We also filled out significant work restriction paperwork for her. She still has some discomfort right lateral hip and has not done any physical therapy. Hip abductors are very weak on the right side, normal on the left side. She is going to try to get into [facility], I am happy to print a written order for her. The need to focus on hip abductor strengthening. Declines NSAIDs, Tylenol, declines neuropathic agents and declines tramadol. Return to see me in about 8 weeks. I will update her work note. Hip MRI if no better.  Update: End of visit patient asked for tramadol.    ____________________________________________ [name]. Benjamin Stain, M.D., ABFM., CAQSM., AME. Primary Care and Sports Medicine [facility]  Adjunct Professor of Family Medicine  [place] [employer] [facility] [employer]  Restaurant manager, fast food

## 2023-12-06 ENCOUNTER — Encounter: Payer: Self-pay | Admitting: Sports Medicine

## 2023-12-06 NOTE — Telephone Encounter (Signed)
Patient is requesting a new location for physical therapy. Thanks in advance.

## 2023-12-11 NOTE — Telephone Encounter (Signed)
Copied from CRM [id number]. Topic: Clinical - Medication Refill >> Dec 06, 2023 10:20 AM [name] B wrote: Most Recent Primary Care Visit:  Provider: Monica Becton  Department: [facility] CARE [facility]  Visit Type: OFFICE VISIT  Date: 12/05/2023  Medication: Tramadol  Has the patient contacted their pharmacy? No (Agent: If no, request that the patient contact the pharmacy for the refill. If patient does not wish to contact the pharmacy document the reason why and proceed with request.) (Agent: If yes, when and what did the pharmacy advise?)  Is this the correct pharmacy for this prescription? Yes If no, delete pharmacy and type the correct one.  This is the patient's preferred pharmacy:  [facility] [id number] [place], [place] - [address] [place] [address] Phone: [phone] Fax: [phone]   Has the prescription been filled recently? No  Is the patient out of the medication? Yes  Has the patient been seen for an appointment in the last year OR does the patient have an upcoming appointment? Yes  Can we respond through MyChart? Yes  Agent: Please be advised that Rx refills may take up to 3 business days. We ask that you follow-up with your pharmacy.

## 2023-12-12 ENCOUNTER — Telehealth: Payer: Self-pay

## 2023-12-12 ENCOUNTER — Other Ambulatory Visit: Payer: Self-pay | Admitting: Sports Medicine

## 2023-12-12 ENCOUNTER — Encounter: Payer: Self-pay | Admitting: Sports Medicine

## 2023-12-12 DIAGNOSIS — G8929 Other chronic pain: Secondary | ICD-10-CM

## 2023-12-12 MED ORDER — TRAMADOL HCL 50 MG PO TABS: 50.0000 mg | ORAL_TABLET | Freq: Three times a day (TID) | ORAL | 0 refills | Status: DC | PRN

## 2023-12-12 NOTE — Telephone Encounter (Signed)
Tramadol refill has already been sent to pharmacy on 12/05/23.  Task completed

## 2023-12-12 NOTE — Telephone Encounter (Signed)
Copied from CRM [id number]. Topic: Clinical - Medication Refill >> Dec 09, 2023  1:54 PM Clayton Bibles wrote: Most Recent Primary Care Visit:  Provider: Monica Becton  Department: [facility] CARE [facility]  Visit Type: OFFICE VISIT  Date: 12/05/2023  Medication: Tramadol - Script was loss - [facility] [address] does not have this script - please resend  Has the patient contacted their pharmacy? Yes (Agent: If no, request that the patient contact the pharmacy for the refill. If patient does not wish to contact the pharmacy document the reason why and proceed with request.) (Agent: If yes, when and what did the pharmacy advise?)  Is this the correct pharmacy for this prescription? Yes - [facility] [address] If no, delete pharmacy and type the correct one.  This is the patient's preferred pharmacy:  [facility] [id number] [place], [place] - [address] [place] [address] Phone: [phone] Fax: [phone]  [id number] - [place], [place] - [address] [facility] [address] [facility] [address] [place] [address] Phone: [phone] Fax: [phone]   Has the prescription been filled recently? No  Is the patient out of the medication? Yes  Has the patient been seen for an appointment in the last year OR does the patient have an upcoming appointment? Yes  Can we respond through MyChart? Yes or call with concerns   Agent: Please be advised that Rx refills may take up to 3 business days. We ask that you follow-up with your pharmacy.

## 2023-12-13 ENCOUNTER — Other Ambulatory Visit: Payer: Self-pay

## 2023-12-13 ENCOUNTER — Telehealth: Payer: Self-pay

## 2023-12-13 DIAGNOSIS — G8929 Other chronic pain: Secondary | ICD-10-CM

## 2023-12-13 NOTE — Telephone Encounter (Signed)
Patient states tramadol sent in yesterday went to the wrong pharmacy - Requesting to be resent to: [id number] - [place],  - [address] ,  Pended prescription and this pharmacy

## 2023-12-13 NOTE — Telephone Encounter (Signed)
Copied from CRM [id number]. Topic: General - Other >> Dec 11, 2023  1:23 PM Larwance Sachs wrote: Reason for CRM: Patient called in regarding prescription for tramadol that was sent to Pharmacy December 5th, [facility] stated they sent a request for auth on December 5th and have received no response  Patient asked for call back at [phone]

## 2023-12-13 NOTE — Telephone Encounter (Signed)
Copied from CRM [id number]. Topic: Clinical - Prescription Issue >> Dec 12, 2023 11:47 AM Dennison Nancy wrote: Reason for CRM: Patient got off the phone today 12/12/23  with [facility] on [address] for the traMADol (ULTRAM) 50 MG tablet and pharmacy ask if can resend the refill , Patient  want the refill to go to the  [id number] - [place], [place] - [address] , Patient request if Judeth Cornfield can reachout to patient # [phone]

## 2023-12-13 NOTE — Telephone Encounter (Signed)
Prescription refill of Tramadol sent to pharmacy yesterday.

## 2023-12-18 ENCOUNTER — Ambulatory Visit: Payer: Medicaid Other | Attending: Sports Medicine

## 2023-12-18 DIAGNOSIS — R262 Difficulty in walking, not elsewhere classified: Secondary | ICD-10-CM | POA: Diagnosis present

## 2023-12-18 DIAGNOSIS — G8929 Other chronic pain: Secondary | ICD-10-CM | POA: Insufficient documentation

## 2023-12-18 DIAGNOSIS — M25551 Pain in right hip: Secondary | ICD-10-CM | POA: Insufficient documentation

## 2023-12-18 DIAGNOSIS — M6281 Muscle weakness (generalized): Secondary | ICD-10-CM | POA: Diagnosis present
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
## 2023-12-30 ENCOUNTER — Telehealth: Payer: Self-pay

## 2023-12-30 ENCOUNTER — Ambulatory Visit: Payer: Medicaid Other

## 2023-12-30 NOTE — Telephone Encounter (Signed)
Called patient about missed PT appointment. No answer but LVM on no show policy and next follow up appointment.

## 2024-01-02 ENCOUNTER — Ambulatory Visit: Payer: Medicaid Other | Attending: Sports Medicine

## 2024-01-02 DIAGNOSIS — R262 Difficulty in walking, not elsewhere classified: Secondary | ICD-10-CM | POA: Insufficient documentation

## 2024-01-02 DIAGNOSIS — M25551 Pain in right hip: Secondary | ICD-10-CM | POA: Insufficient documentation

## 2024-01-02 DIAGNOSIS — M6281 Muscle weakness (generalized): Secondary | ICD-10-CM | POA: Insufficient documentation
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
## 2024-01-02 NOTE — Progress Notes (Signed)
    Procedures performed today:    None.  Independent interpretation of notes and tests performed by another provider:   None.  Brief History, Exam, Impression, and Recommendations:    Chronic right hip pain This is a 63 year old female, she has multifactorial right hip pain, she had hip joint and trochanteric bursa injections, only partial relief, she has had restrictions paperwork filled out multiple times, today she continues to have pain at right lateral hip, she is only just now started physical therapy, hip abductor's were very weak on the right side at the last visit. We filled out additional work restrictions paperwork today, she did decline Tylenol , NSAIDs, neuropathic agents but did request tramadol  at the end of the last visit. She will do a solid 6 weeks of physical therapy and if insufficient improvement we will consider an MRI. We have written the work restrictions until the end of March 2025.    ____________________________________________ Debby PARAS. Curtis, M.D., ABFM., CAQSM., AME. Primary Care and Sports Medicine [facility]  Adjunct Professor of [facility] Medicine  [employer]  [employer], [employer]

## 2024-01-07 ENCOUNTER — Ambulatory Visit: Payer: Medicaid Other

## 2024-01-07 ENCOUNTER — Ambulatory Visit (INDEPENDENT_AMBULATORY_CARE_PROVIDER_SITE_OTHER): Payer: Medicaid Other | Admitting: Sports Medicine

## 2024-01-07 DIAGNOSIS — G8929 Other chronic pain: Secondary | ICD-10-CM

## 2024-01-07 DIAGNOSIS — M25551 Pain in right hip: Secondary | ICD-10-CM

## 2024-01-07 DIAGNOSIS — R262 Difficulty in walking, not elsewhere classified: Secondary | ICD-10-CM

## 2024-01-07 DIAGNOSIS — M6281 Muscle weakness (generalized): Secondary | ICD-10-CM

## 2024-01-07 NOTE — Assessment & Plan Note (Signed)
 This is a 63 year old female, she has multifactorial right hip pain, she had hip joint and trochanteric bursa injections, only partial relief, she has had restrictions paperwork filled out multiple times, today she continues to have pain at right lateral hip, she is only just now started physical therapy, hip abductor's were very weak on the right side at the last visit. We filled out additional work restrictions paperwork today, she did decline Tylenol , NSAIDs, neuropathic agents but did request tramadol  at the end of the last visit. She will do a solid 6 weeks of physical therapy and if insufficient improvement we will consider an MRI. We have written the work restrictions until the end of March 2025.
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
## 2024-01-10 ENCOUNTER — Ambulatory Visit: Payer: Medicaid Other

## 2024-01-10 DIAGNOSIS — M25551 Pain in right hip: Secondary | ICD-10-CM

## 2024-01-10 DIAGNOSIS — R262 Difficulty in walking, not elsewhere classified: Secondary | ICD-10-CM

## 2024-01-10 DIAGNOSIS — M6281 Muscle weakness (generalized): Secondary | ICD-10-CM
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
## 2024-01-14 ENCOUNTER — Ambulatory Visit: Payer: Medicaid Other

## 2024-01-16 ENCOUNTER — Ambulatory Visit: Payer: Medicaid Other

## 2024-01-16 DIAGNOSIS — M6281 Muscle weakness (generalized): Secondary | ICD-10-CM

## 2024-01-16 DIAGNOSIS — R262 Difficulty in walking, not elsewhere classified: Secondary | ICD-10-CM

## 2024-01-16 DIAGNOSIS — M25551 Pain in right hip: Secondary | ICD-10-CM
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
## 2024-01-20 ENCOUNTER — Telehealth: Payer: Self-pay

## 2024-01-20 ENCOUNTER — Ambulatory Visit: Payer: Medicaid Other

## 2024-01-20 NOTE — Telephone Encounter (Signed)
Called patient to inquire about missed PT appointment. No answer but LVM on next follow up appointment.

## 2024-01-22 ENCOUNTER — Ambulatory Visit: Payer: Medicaid Other

## 2024-01-27 ENCOUNTER — Ambulatory Visit: Payer: Medicaid Other

## 2024-01-29 ENCOUNTER — Ambulatory Visit: Payer: Medicaid Other

## 2024-01-29 DIAGNOSIS — R262 Difficulty in walking, not elsewhere classified: Secondary | ICD-10-CM

## 2024-01-29 DIAGNOSIS — M25551 Pain in right hip: Secondary | ICD-10-CM | POA: Diagnosis not present

## 2024-01-29 DIAGNOSIS — M6281 Muscle weakness (generalized): Secondary | ICD-10-CM
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
## 2024-01-30 ENCOUNTER — Ambulatory Visit: Payer: Medicaid Other | Admitting: Sports Medicine

## 2024-02-03 ENCOUNTER — Ambulatory Visit: Payer: Medicaid Other | Attending: Sports Medicine

## 2024-02-03 DIAGNOSIS — M6281 Muscle weakness (generalized): Secondary | ICD-10-CM | POA: Diagnosis present

## 2024-02-03 DIAGNOSIS — M25551 Pain in right hip: Secondary | ICD-10-CM | POA: Insufficient documentation

## 2024-02-03 DIAGNOSIS — R262 Difficulty in walking, not elsewhere classified: Secondary | ICD-10-CM | POA: Diagnosis present
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
## 2024-02-05 ENCOUNTER — Ambulatory Visit: Payer: Medicaid Other

## 2024-02-05 DIAGNOSIS — M6281 Muscle weakness (generalized): Secondary | ICD-10-CM

## 2024-02-05 DIAGNOSIS — M25551 Pain in right hip: Secondary | ICD-10-CM | POA: Diagnosis not present

## 2024-02-05 DIAGNOSIS — R262 Difficulty in walking, not elsewhere classified: Secondary | ICD-10-CM
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
## 2024-02-10 ENCOUNTER — Ambulatory Visit: Payer: Medicaid Other

## 2024-02-10 DIAGNOSIS — M6281 Muscle weakness (generalized): Secondary | ICD-10-CM

## 2024-02-10 DIAGNOSIS — R262 Difficulty in walking, not elsewhere classified: Secondary | ICD-10-CM

## 2024-02-10 DIAGNOSIS — M25551 Pain in right hip: Secondary | ICD-10-CM | POA: Diagnosis not present
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
## 2024-02-18 ENCOUNTER — Ambulatory Visit: Payer: Medicaid Other

## 2024-02-18 ENCOUNTER — Encounter: Payer: Self-pay | Admitting: Sports Medicine

## 2024-02-18 ENCOUNTER — Ambulatory Visit (INDEPENDENT_AMBULATORY_CARE_PROVIDER_SITE_OTHER): Payer: Medicaid Other | Admitting: Sports Medicine

## 2024-02-18 DIAGNOSIS — G8929 Other chronic pain: Secondary | ICD-10-CM

## 2024-02-18 DIAGNOSIS — M25551 Pain in right hip: Secondary | ICD-10-CM

## 2024-02-18 DIAGNOSIS — M545 Low back pain, unspecified: Secondary | ICD-10-CM | POA: Diagnosis not present

## 2024-02-18 MED ORDER — TRAMADOL HCL 50 MG PO TABS: 50.0000 mg | ORAL_TABLET | Freq: Three times a day (TID) | ORAL | 0 refills | Status: AC | PRN

## 2024-02-18 NOTE — Assessment & Plan Note (Addendum)
 Teresa [name] returns, she has chronic multifactorial right hip pain, she has had hip joint and trochanteric bursa injections with only partial relief, we have done work restrictions paperwork multiple times, at the last visit we added formal physical therapy due to severely weak hip abductors. She has improved considerably over the past 5 to 6 weeks with therapy. Today her hip abductor strength is much better. She still does get some discomfort particular at night, the pain does occasionally radiate down her entire leg. She does have a lumbar spine MRI from 2021 that did show multiple areas of neuroforaminal stenosis, I do think we need an updated lumbar spine MRI, I would also like a hip MRI looking for hip abductor tendinopathy/tearing. She does need updated x-rays to get her MRIs approved, these will be ordered. She will also need an updated PT referral.  For insurance purposes she has failed greater than 6 weeks of formal physical therapy, medications, x-rays done.

## 2024-02-18 NOTE — Progress Notes (Signed)
    Procedures performed today:    None.  Independent interpretation of notes and tests performed by another provider:   None.  Brief History, Exam, Impression, and Recommendations:    Chronic right hip pain Teresa [name] returns, she has chronic multifactorial right hip pain, she has had hip joint and trochanteric bursa injections with only partial relief, we have done work restrictions paperwork multiple times, at the last visit we added formal physical therapy due to severely weak hip abductors. She has improved considerably over the past 5 to 6 weeks with therapy. Today her hip abductor strength is much better. She still does get some discomfort particular at night, the pain does occasionally radiate down her entire leg. She does have a lumbar spine MRI from 2021 that did show multiple areas of neuroforaminal stenosis, I do think we need an updated lumbar spine MRI, I would also like a hip MRI looking for hip abductor tendinopathy/tearing. She does need updated x-rays to get her MRIs approved, these will be ordered. She will also need an updated PT referral.  For insurance purposes she has failed greater than 6 weeks of formal physical therapy, medications, x-rays done.    ____________________________________________ [name]. Benjamin Stain, M.D., ABFM., CAQSM., AME. Primary Care and Sports Medicine [facility]  Adjunct Professor of Family Medicine  [place] [employer]  Restaurant manager, fast food

## 2024-02-19 ENCOUNTER — Ambulatory Visit: Payer: Medicaid Other

## 2024-02-21 ENCOUNTER — Ambulatory Visit: Payer: Medicaid Other

## 2024-02-22 ENCOUNTER — Ambulatory Visit: Payer: Medicaid Other

## 2024-02-22 DIAGNOSIS — M545 Low back pain, unspecified: Secondary | ICD-10-CM | POA: Diagnosis not present

## 2024-02-22 DIAGNOSIS — M25551 Pain in right hip: Secondary | ICD-10-CM

## 2024-02-22 DIAGNOSIS — G8929 Other chronic pain: Secondary | ICD-10-CM

## 2024-02-27 ENCOUNTER — Ambulatory Visit: Payer: Medicaid Other

## 2024-03-03 ENCOUNTER — Ambulatory Visit: Payer: Medicaid Other

## 2024-03-05 ENCOUNTER — Ambulatory Visit: Payer: Medicaid Other

## 2024-03-06 ENCOUNTER — Encounter: Payer: Self-pay | Admitting: Sports Medicine

## 2024-03-12 ENCOUNTER — Telehealth: Payer: Self-pay

## 2024-03-12 ENCOUNTER — Ambulatory Visit (INDEPENDENT_AMBULATORY_CARE_PROVIDER_SITE_OTHER): Admitting: Sports Medicine

## 2024-03-12 ENCOUNTER — Encounter: Payer: Self-pay | Admitting: Sports Medicine

## 2024-03-12 ENCOUNTER — Other Ambulatory Visit (INDEPENDENT_AMBULATORY_CARE_PROVIDER_SITE_OTHER): Payer: Self-pay

## 2024-03-12 DIAGNOSIS — M5030 Other cervical disc degeneration, unspecified cervical region: Secondary | ICD-10-CM | POA: Diagnosis not present

## 2024-03-12 DIAGNOSIS — G894 Chronic pain syndrome: Secondary | ICD-10-CM | POA: Diagnosis not present

## 2024-03-12 DIAGNOSIS — M25551 Pain in right hip: Secondary | ICD-10-CM

## 2024-03-12 DIAGNOSIS — G8929 Other chronic pain: Secondary | ICD-10-CM

## 2024-03-12 MED ORDER — TRIAMCINOLONE ACETONIDE 40 MG/ML IJ SUSP
40.0000 mg | Freq: Once | INTRAMUSCULAR | Status: AC
Start: 2024-03-12 — End: 2024-03-12
  Administered 2024-03-12: 40 mg via INTRAMUSCULAR

## 2024-03-12 NOTE — Telephone Encounter (Signed)
 Copied from CRM [id number]. Topic: Clinical - Lab/Test Results >> Mar 09, 2024  2:37 PM Nila Nephew wrote: Reason for CRM: Patient is request results for MRI of the lower back be expedited if possible.

## 2024-03-12 NOTE — Assessment & Plan Note (Signed)
 Currently on Cymbalta 60, there is a definite interplay between depression and interpretation of this physical pain. We will increase Cymbalta to 120 mg for now.

## 2024-03-12 NOTE — Telephone Encounter (Signed)
 Looks like has resulted but not yet been reviewed by provider.

## 2024-03-12 NOTE — Progress Notes (Signed)
    Procedures performed today:    Procedure: Real-time Ultrasound Guided injection of the right hip joint Device: Samsung HS60  Verbal informed consent obtained.  Time-out conducted.  Noted no overlying erythema, induration, or other signs of local infection.  Skin prepped in a sterile fashion.  Local anesthesia: Topical Ethyl chloride.  With sterile technique and under real time ultrasound guidance: Arthritic joint noted, 1 cc Kenalog 40, 2 cc lidocaine, 2 cc bupivacaine injected easily Completed without difficulty  Advised to call if fevers/chills, erythema, induration, drainage, or persistent bleeding.  Images permanently stored and available for review in PACS.  Impression: Technically successful ultrasound guided injection.  Independent interpretation of notes and tests performed by another provider:   None.  Brief History, Exam, Impression, and Recommendations:    Chronic right hip pain Chronic multifactorial right hip pain, she has had hip joint and trochanteric bursa injections, partial relief, she has had multiple work restrictions paperwork. She started some formal physical therapy but was unable to complete it, she is interested in some aquatic therapy at this juncture. Her hip abductor strength did improve with the physical therapy that she did, we also obtained lumbar spine and hip MRIs. Today her pain is predominately in the right groin and worse with internal rotation consistent with a hip joint pain generator. Today we did a hip joint injection, we will have her restart physical therapy at [facility] for [facility]. Return to see me in about 6 weeks. Handicap placard given. Out of work for 2 more months.  Chronic pain syndrome (WC) Currently on Cymbalta 60, there is a definite interplay between depression and interpretation of this physical pain. We will increase Cymbalta to 120 mg for now.    ____________________________________________ [name].  Benjamin Stain, M.D., ABFM., CAQSM., AME. Primary Care and Sports Medicine [facility]  Adjunct Professor of Family Medicine  [employer]  Restaurant manager, fast food

## 2024-03-12 NOTE — Assessment & Plan Note (Signed)
 Chronic multifactorial right hip pain, she has had hip joint and trochanteric bursa injections, partial relief, she has had multiple work restrictions paperwork. She started some formal physical therapy but was unable to complete it, she is interested in some aquatic therapy at this juncture. Her hip abductor strength did improve with the physical therapy that she did, we also obtained lumbar spine and hip MRIs. Today her pain is predominately in the right groin and worse with internal rotation consistent with a hip joint pain generator. Today we did a hip joint injection, we will have her restart physical therapy at the [facility] for [facility]. Return to see me in about 6 weeks. Handicap placard given. Out of work for 2 more months.

## 2024-03-12 NOTE — Addendum Note (Signed)
 Addended by: Samule Dry on: 03/12/2024 11:43 AM   Modules accepted: Orders

## 2024-04-01 ENCOUNTER — Ambulatory Visit: Payer: Medicaid Other | Admitting: Sports Medicine

## 2024-04-20 ENCOUNTER — Encounter: Payer: Self-pay | Admitting: Sports Medicine

## 2024-04-23 ENCOUNTER — Ambulatory Visit: Admitting: Sports Medicine

## 2024-04-29 ENCOUNTER — Ambulatory Visit

## 2024-04-29 ENCOUNTER — Ambulatory Visit (INDEPENDENT_AMBULATORY_CARE_PROVIDER_SITE_OTHER): Admitting: Sports Medicine

## 2024-04-29 DIAGNOSIS — M47812 Spondylosis without myelopathy or radiculopathy, cervical region: Secondary | ICD-10-CM

## 2024-04-29 DIAGNOSIS — G8929 Other chronic pain: Secondary | ICD-10-CM | POA: Diagnosis not present

## 2024-04-29 DIAGNOSIS — M25551 Pain in right hip: Secondary | ICD-10-CM

## 2024-04-29 NOTE — Progress Notes (Signed)
    Procedures performed today:    None.  Independent interpretation of notes and tests performed by another provider:   None.  Brief History, Exam, Impression, and Recommendations:    Cervical facet syndrome (WC) (Bilateral) (L>R) Multilevel cervical spondylosis, bilateral neck and upper extremity pain with burning.  Increasing neck pain, she has had cervical facet blocks in the past. Pending updated x-rays, PT. Return in 6 weeks for this, MR for interventional planning if not better.  Chronic right hip pain Multifactorial right hip pain, she has had hip joint and trochanteric bursa injections, she did not do formal PT, but she is going to be getting into it again. We did an injection to her hip joint back in March and she had fantastic relief. Return as needed. We did some additional FMLA and disability paperwork today with a return to work date of May 31, 2024.    ____________________________________________ Joselyn Nicely. Sandy Crumb, M.D., ABFM., CAQSM., AME. Primary Care and Sports Medicine [facility]  Adjunct Professor of [facility] Medicine  [employer]  Restaurant manager, fast food

## 2024-04-29 NOTE — Assessment & Plan Note (Signed)
 Multilevel cervical spondylosis, bilateral neck and upper extremity pain with burning.  Increasing neck pain, she has had cervical facet blocks in the past. Pending updated x-rays, PT. Return in 6 weeks for this, MR for interventional planning if not better.

## 2024-04-29 NOTE — Assessment & Plan Note (Signed)
 Multifactorial right hip pain, she has had hip joint and trochanteric bursa injections, she did not do formal PT, but she is going to be getting into it again. We did an injection to her hip joint back in March and she had fantastic relief. Return as needed. We did some additional FMLA and disability paperwork today with a return to work date of May 31, 2024.

## 2024-06-11 ENCOUNTER — Ambulatory Visit (INDEPENDENT_AMBULATORY_CARE_PROVIDER_SITE_OTHER): Admitting: Sports Medicine

## 2024-06-11 DIAGNOSIS — G894 Chronic pain syndrome: Secondary | ICD-10-CM

## 2024-06-11 NOTE — Assessment & Plan Note (Signed)
 Currently on Cymbalta  120, we have been managing her hip and back pain, she has had some difficulty with work, and has needed multiple disability and accommodation forms filled out, she brings 3 forms today, these were all filled out with an out of work date of 01/01/2024 and a return to work date of 05/31/2024 full duty. These were all faxed off, copied, scanned and the originals were returned to the patient.

## 2024-06-11 NOTE — Progress Notes (Signed)
    Procedures performed today:    None.  Independent interpretation of notes and tests performed by another provider:   None.  Brief History, Exam, Impression, and Recommendations:    Chronic pain syndrome (WC) Currently on Cymbalta  120, we have been managing her hip and back pain, she has had some difficulty with work, and has needed multiple disability and accommodation forms filled out, she brings 3 forms today, these were all filled out with an out of work date of 01/01/2024 and a return to work date of 05/31/2024 full duty. These were all faxed off, copied, scanned and the originals were returned to the patient.    ____________________________________________ Teresa [name]. Sandy Crumb, M.D., ABFM., CAQSM., AME. Primary Care and Sports Medicine [facility]  Adjunct Professor of [facility] Medicine  [employer]  Restaurant manager, fast food

## 2024-09-01 ENCOUNTER — Encounter: Payer: Self-pay | Admitting: Sports Medicine

## 2025-05-17 ENCOUNTER — Ambulatory Visit: Admitting: Dermatology
